# Patient Record
Sex: Male | Born: 1935
Health system: Southern US, Community
[De-identification: ages and names within clinical notes are randomized; demographics above are authoritative.]

## PROBLEM LIST (undated history)

## (undated) DIAGNOSIS — M25551 Pain in right hip: Secondary | ICD-10-CM

## (undated) DIAGNOSIS — R55 Syncope and collapse: Secondary | ICD-10-CM

## (undated) DIAGNOSIS — I10 Essential (primary) hypertension: Secondary | ICD-10-CM

## (undated) DIAGNOSIS — Z953 Presence of xenogenic heart valve: Secondary | ICD-10-CM

## (undated) DIAGNOSIS — Z789 Other specified health status: Secondary | ICD-10-CM

## (undated) DIAGNOSIS — E118 Type 2 diabetes mellitus with unspecified complications: Secondary | ICD-10-CM

## (undated) DIAGNOSIS — I639 Cerebral infarction, unspecified: Secondary | ICD-10-CM

## (undated) DIAGNOSIS — I6509 Occlusion and stenosis of unspecified vertebral artery: Secondary | ICD-10-CM

## (undated) DIAGNOSIS — I7121 Aneurysm of the ascending aorta, without rupture: Secondary | ICD-10-CM

## (undated) DIAGNOSIS — Z5189 Encounter for other specified aftercare: Secondary | ICD-10-CM

## (undated) DIAGNOSIS — E785 Hyperlipidemia, unspecified: Secondary | ICD-10-CM

## (undated) DIAGNOSIS — I2699 Other pulmonary embolism without acute cor pulmonale: Secondary | ICD-10-CM

## (undated) DIAGNOSIS — I359 Nonrheumatic aortic valve disorder, unspecified: Secondary | ICD-10-CM

## (undated) DIAGNOSIS — M1990 Unspecified osteoarthritis, unspecified site: Secondary | ICD-10-CM

## (undated) DIAGNOSIS — I739 Peripheral vascular disease, unspecified: Secondary | ICD-10-CM

## (undated) DIAGNOSIS — K5730 Diverticulosis of large intestine without perforation or abscess without bleeding: Secondary | ICD-10-CM

## (undated) DIAGNOSIS — I712 Thoracic aortic aneurysm, without rupture: Secondary | ICD-10-CM

## (undated) DIAGNOSIS — J439 Emphysema, unspecified: Secondary | ICD-10-CM

## (undated) DIAGNOSIS — I2510 Atherosclerotic heart disease of native coronary artery without angina pectoris: Secondary | ICD-10-CM

## (undated) DIAGNOSIS — E079 Disorder of thyroid, unspecified: Secondary | ICD-10-CM

## (undated) DIAGNOSIS — I7123 Aneurysm of the descending thoracic aorta, without rupture: Secondary | ICD-10-CM

## (undated) DIAGNOSIS — Z933 Colostomy status: Secondary | ICD-10-CM

## (undated) DIAGNOSIS — I509 Heart failure, unspecified: Secondary | ICD-10-CM

## (undated) DIAGNOSIS — E119 Type 2 diabetes mellitus without complications: Secondary | ICD-10-CM

## (undated) DIAGNOSIS — H269 Unspecified cataract: Secondary | ICD-10-CM

## (undated) DIAGNOSIS — K219 Gastro-esophageal reflux disease without esophagitis: Secondary | ICD-10-CM

## (undated) HISTORY — DX: Atherosclerotic heart disease of native coronary artery without angina pectoris: I25.10

## (undated) HISTORY — DX: Unspecified cataract: H26.9

## (undated) HISTORY — DX: Other pulmonary embolism without acute cor pulmonale: I26.99

## (undated) HISTORY — DX: Nonrheumatic aortic valve disorder, unspecified: I35.9

## (undated) HISTORY — DX: Cerebral infarction, unspecified: I63.9

## (undated) HISTORY — PX: SMALL INTESTINE SURGERY: SHX150

## (undated) HISTORY — PX: AORTIC VALVE REPAIR: SHX6306

## (undated) HISTORY — DX: Gastro-esophageal reflux disease without esophagitis: K21.9

## (undated) HISTORY — DX: Emphysema, unspecified: J43.9

## (undated) HISTORY — DX: Occlusion and stenosis of unspecified vertebral artery: I65.09

## (undated) HISTORY — DX: Syncope and collapse: R55

## (undated) HISTORY — DX: Hyperlipidemia, unspecified: E78.5

## (undated) HISTORY — DX: Thoracic aortic aneurysm, without rupture: I71.2

## (undated) HISTORY — DX: Peripheral vascular disease, unspecified: I73.9

## (undated) HISTORY — DX: Disorder of thyroid, unspecified: E07.9

## (undated) HISTORY — PX: HERNIA REPAIR: SHX51

## (undated) HISTORY — DX: Unspecified osteoarthritis, unspecified site: M19.90

## (undated) HISTORY — DX: Colostomy status: Z93.3

## (undated) HISTORY — DX: Essential (primary) hypertension: I10

## (undated) HISTORY — DX: Type 2 diabetes mellitus with unspecified complications: E11.8

## (undated) HISTORY — DX: Presence of xenogenic heart valve: Z95.3

## (undated) HISTORY — PX: COLON SURGERY: SHX602

## (undated) HISTORY — DX: Other specified health status: Z78.9

## (undated) HISTORY — DX: Pain in right hip: M25.551

## (undated) HISTORY — DX: Encounter for other specified aftercare: Z51.89

---

## 2011-06-17 DIAGNOSIS — I359 Nonrheumatic aortic valve disorder, unspecified: Secondary | ICD-10-CM

## 2011-06-29 DIAGNOSIS — I359 Nonrheumatic aortic valve disorder, unspecified: Secondary | ICD-10-CM

## 2011-08-11 ENCOUNTER — Ambulatory Visit (INDEPENDENT_AMBULATORY_CARE_PROVIDER_SITE_OTHER): Payer: Self-pay | Admitting: Physician Assistant

## 2011-08-11 DIAGNOSIS — I359 Nonrheumatic aortic valve disorder, unspecified: Secondary | ICD-10-CM

## 2011-08-11 DIAGNOSIS — I350 Nonrheumatic aortic (valve) stenosis: Secondary | ICD-10-CM

## 2011-08-11 DIAGNOSIS — I2510 Atherosclerotic heart disease of native coronary artery without angina pectoris: Secondary | ICD-10-CM

## 2011-08-11 NOTE — Progress Notes (Signed)
Jonathan Ryan returned today for scheduled followup after aortic valve replacement on 06/29/2011 for severe aortic stenosis. He had an uneventful postoperative course. Since his discharge home he is continuing to make progress. He is having minimal shortness of breath and is noticed improved stamina with activity. He denies any chest pain. On exam his heart is in a regular rate and rhythm although has had a history of atrial fibrillation. His sternotomy incision is well healed. Sternum stable. Breath sounds are clear to auscultation. He has only trace peripheral edema. His medications are reviewed and haven't changed since his discharge except he is no longer taking metoprolol since he was apparently hypotensive. No further followup scheduled with our office. He has a is to continue following with his cardiologist.

## 2014-08-26 NOTE — Patient Outreach (Signed)
[place] [facility]) Care Management  08/26/2014  ABDULLOH ULLOM Feb 08, 1929 [id number]   Received referral from [facility] for SW, assigned [employer], [place].  Ronnell Freshwater. [place], [place] [facility] [place] Assistant Phone: [phone] Fax: [phone]

## 2014-09-04 ENCOUNTER — Encounter: Payer: Self-pay | Admitting: *Deleted

## 2014-09-04 ENCOUNTER — Other Ambulatory Visit: Payer: Self-pay | Admitting: *Deleted

## 2014-09-04 NOTE — Patient Outreach (Signed)
[place] [facility]) Care Management  [facility] Social Work  09/04/2014  Jonathan Ryan 1928/10/16 [id number]    Current Medications:  No current outpatient prescriptions on file.   No current facility-administered medications for this visit.    Functional Status:  In your present state of health, do you have any difficulty performing the following activities: 09/04/2014  Hearing? N  Vision? N  Difficulty concentrating or making decisions? N  Walking or climbing stairs? N  Dressing or bathing? N  Doing errands, shopping? N  Preparing Food and eating ? N  Using the Toilet? N  In the past six months, have you accidently leaked urine? N  Do you have problems with loss of bowel control? N  Managing your Medications? N  Managing your Finances? N  Housekeeping or managing your Housekeeping? N    Fall/Depression Screening:  PHQ 2/9 Scores 09/04/2014  PHQ - 2 Score 0  PHQ- 9 Score 1    Assessment:   CSW was able to make initial contact with patient today to perform phone assessment, as well as assess and assist with social work needs and services.  CSW introduced self, explained role and types of services provided through [facility] CM).  CSW went on to explain to patient that CSW received a referral from patient's [facility] Case Manager, [employer]  Jonathan Ryan requested that CSW contact patient to provide possible referrals to community agencies and resources, as well as offer counseling and supportive services to patient's terminal wife, Jonathan Ryan, recently referred to Hospice for end-of-life care. CSW was able to obtain two HIPAA compliant identifiers from patient, which included patient's name and date of birth.  CSW also received verbal consent from patient to converse with CSW, as well as make possible referrals to various community agencies and resources, if interested.  Patient admitted that he was aware that CSW would be  contacting him, but reported, "I've decided to go with [facility]".  CSW reminded patient that patient does not have to choose between [facility]) and [facility] CM, as [facility] is home care services and [facility] CM is community case management services.  Not to mention, CSW explained to patient that [facility] CM does not bill for services, as a benefit of having a [facility] provider. Patient voiced understanding, but reported that [facility] CM services would not be needed at this time.  Patient went on to say that Jonathan Ryan died early yesterday morning at [facility].  Patient admitted that he was surprised how quickly her disease progressed, as Jonathan Ryan was only recently enrolled in Hospice services.  Patient admits to being sad, but at peace knowing that Jonathan Ryan is no longer suffering.  Patient went on to explain that Jonathan Ryan "has been ready to meet her maker for several years".  Patient is able to find resolve in knowing that he and Jonathan Ryan will be together again in the afterlife. CSW inquired as to whether or not patient would be interested in receiving counseling and supportive services, either through a Grief and Loss Program, such as Hospice.  Patient denied, indicating that he has a good support system and the ability to adequately cope with his recent loss.  Patient was very appreciative of CSW's call, agreeing to take down CSW's contact information, in the event that social work needs are identified in the future.  CSW will perform a case closure on patient, as no social work  needs have been identified at this time.  Plan:   CSW will perform a case closure on patient, as no social work needs have been identified at present and patient admits that he is currently receiving home care services through [facility] CSW will contact patient's Primary Care Physician, Dr. Kennith Maes to ensure that Dr. Helene Kelp is aware of CSW's plans to close patient's case, per  patient's request. CSW will submit a case closure request to Jonathan Ryan, Care Management Assistant with [facility], in the form of an In Basket message.  Jonathan Ryan, BSW, MSW, [place] [facility] [place], [place], [place] [address] [email or web address] [phone]

## 2015-01-06 DIAGNOSIS — Z953 Presence of xenogenic heart valve: Secondary | ICD-10-CM

## 2015-01-06 DIAGNOSIS — I712 Thoracic aortic aneurysm, without rupture, unspecified: Secondary | ICD-10-CM

## 2015-01-06 DIAGNOSIS — I7123 Aneurysm of the descending thoracic aorta, without rupture: Secondary | ICD-10-CM | POA: Insufficient documentation

## 2015-01-06 DIAGNOSIS — I2510 Atherosclerotic heart disease of native coronary artery without angina pectoris: Secondary | ICD-10-CM | POA: Insufficient documentation

## 2015-01-06 DIAGNOSIS — E785 Hyperlipidemia, unspecified: Secondary | ICD-10-CM

## 2015-01-06 DIAGNOSIS — Z789 Other specified health status: Secondary | ICD-10-CM

## 2015-01-06 HISTORY — DX: Hyperlipidemia, unspecified: E78.5

## 2015-01-06 HISTORY — DX: Atherosclerotic heart disease of native coronary artery without angina pectoris: I25.10

## 2015-01-06 HISTORY — DX: Thoracic aortic aneurysm, without rupture, unspecified: I71.20

## 2015-01-06 HISTORY — DX: Thoracic aortic aneurysm, without rupture: I71.2

## 2015-01-06 HISTORY — DX: Other specified health status: Z78.9

## 2015-01-06 HISTORY — DX: Presence of xenogenic heart valve: Z95.3

## 2015-04-20 DIAGNOSIS — J111 Influenza due to unidentified influenza virus with other respiratory manifestations: Secondary | ICD-10-CM | POA: Diagnosis not present

## 2015-05-01 DIAGNOSIS — J208 Acute bronchitis due to other specified organisms: Secondary | ICD-10-CM | POA: Diagnosis not present

## 2015-05-01 DIAGNOSIS — Z6828 Body mass index (BMI) 28.0-28.9, adult: Secondary | ICD-10-CM | POA: Diagnosis not present

## 2015-05-26 DIAGNOSIS — E781 Pure hyperglyceridemia: Secondary | ICD-10-CM | POA: Diagnosis not present

## 2015-05-26 DIAGNOSIS — R600 Localized edema: Secondary | ICD-10-CM | POA: Diagnosis not present

## 2015-05-26 DIAGNOSIS — Z6828 Body mass index (BMI) 28.0-28.9, adult: Secondary | ICD-10-CM | POA: Diagnosis not present

## 2015-05-26 DIAGNOSIS — I10 Essential (primary) hypertension: Secondary | ICD-10-CM | POA: Diagnosis not present

## 2015-05-26 DIAGNOSIS — E1129 Type 2 diabetes mellitus with other diabetic kidney complication: Secondary | ICD-10-CM | POA: Diagnosis not present

## 2015-05-26 DIAGNOSIS — C61 Malignant neoplasm of prostate: Secondary | ICD-10-CM | POA: Diagnosis not present

## 2015-05-26 DIAGNOSIS — D638 Anemia in other chronic diseases classified elsewhere: Secondary | ICD-10-CM | POA: Diagnosis not present

## 2015-06-07 DIAGNOSIS — Z7982 Long term (current) use of aspirin: Secondary | ICD-10-CM | POA: Diagnosis not present

## 2015-06-07 DIAGNOSIS — Z953 Presence of xenogenic heart valve: Secondary | ICD-10-CM | POA: Diagnosis not present

## 2015-06-07 DIAGNOSIS — I509 Heart failure, unspecified: Secondary | ICD-10-CM | POA: Diagnosis not present

## 2015-06-07 DIAGNOSIS — Z952 Presence of prosthetic heart valve: Secondary | ICD-10-CM | POA: Diagnosis not present

## 2015-06-07 DIAGNOSIS — R0600 Dyspnea, unspecified: Secondary | ICD-10-CM | POA: Diagnosis not present

## 2015-06-07 DIAGNOSIS — Z7984 Long term (current) use of oral hypoglycemic drugs: Secondary | ICD-10-CM | POA: Diagnosis not present

## 2015-06-07 DIAGNOSIS — R5383 Other fatigue: Secondary | ICD-10-CM | POA: Diagnosis not present

## 2015-06-07 DIAGNOSIS — Z794 Long term (current) use of insulin: Secondary | ICD-10-CM | POA: Diagnosis not present

## 2015-06-07 DIAGNOSIS — Z8546 Personal history of malignant neoplasm of prostate: Secondary | ICD-10-CM | POA: Diagnosis not present

## 2015-06-07 DIAGNOSIS — Z933 Colostomy status: Secondary | ICD-10-CM | POA: Diagnosis not present

## 2015-06-07 DIAGNOSIS — J984 Other disorders of lung: Secondary | ICD-10-CM | POA: Diagnosis not present

## 2015-06-07 DIAGNOSIS — R55 Syncope and collapse: Secondary | ICD-10-CM | POA: Diagnosis not present

## 2015-06-07 DIAGNOSIS — Z880 Allergy status to penicillin: Secondary | ICD-10-CM | POA: Diagnosis not present

## 2015-06-07 DIAGNOSIS — I2510 Atherosclerotic heart disease of native coronary artery without angina pectoris: Secondary | ICD-10-CM | POA: Diagnosis not present

## 2015-06-07 DIAGNOSIS — Z79899 Other long term (current) drug therapy: Secondary | ICD-10-CM | POA: Diagnosis not present

## 2015-06-07 DIAGNOSIS — E119 Type 2 diabetes mellitus without complications: Secondary | ICD-10-CM | POA: Diagnosis not present

## 2015-06-07 DIAGNOSIS — Z888 Allergy status to other drugs, medicaments and biological substances status: Secondary | ICD-10-CM | POA: Diagnosis not present

## 2015-06-07 DIAGNOSIS — I712 Thoracic aortic aneurysm, without rupture: Secondary | ICD-10-CM | POA: Diagnosis not present

## 2015-06-07 DIAGNOSIS — I110 Hypertensive heart disease with heart failure: Secondary | ICD-10-CM | POA: Diagnosis not present

## 2015-06-07 DIAGNOSIS — R42 Dizziness and giddiness: Secondary | ICD-10-CM | POA: Diagnosis not present

## 2015-06-07 DIAGNOSIS — Z66 Do not resuscitate: Secondary | ICD-10-CM | POA: Diagnosis not present

## 2015-06-07 DIAGNOSIS — R0602 Shortness of breath: Secondary | ICD-10-CM | POA: Diagnosis not present

## 2015-06-07 DIAGNOSIS — E782 Mixed hyperlipidemia: Secondary | ICD-10-CM | POA: Diagnosis not present

## 2015-06-08 DIAGNOSIS — I058 Other rheumatic mitral valve diseases: Secondary | ICD-10-CM | POA: Diagnosis not present

## 2015-06-08 DIAGNOSIS — I361 Nonrheumatic tricuspid (valve) insufficiency: Secondary | ICD-10-CM | POA: Diagnosis not present

## 2015-06-08 DIAGNOSIS — I517 Cardiomegaly: Secondary | ICD-10-CM | POA: Diagnosis not present

## 2015-06-08 DIAGNOSIS — I712 Thoracic aortic aneurysm, without rupture: Secondary | ICD-10-CM | POA: Diagnosis not present

## 2015-06-08 DIAGNOSIS — R55 Syncope and collapse: Secondary | ICD-10-CM | POA: Diagnosis not present

## 2015-06-08 DIAGNOSIS — Z952 Presence of prosthetic heart valve: Secondary | ICD-10-CM | POA: Diagnosis not present

## 2015-06-10 ENCOUNTER — Emergency Department (HOSPITAL_COMMUNITY): Payer: PPO

## 2015-06-10 ENCOUNTER — Observation Stay (HOSPITAL_COMMUNITY)
Admission: EM | Admit: 2015-06-10 | Discharge: 2015-06-11 | Disposition: A | Discharge status: Home / Self Care | Payer: PPO | Attending: Internal Medicine | Admitting: Internal Medicine

## 2015-06-10 ENCOUNTER — Encounter (HOSPITAL_COMMUNITY): Payer: Self-pay | Admitting: Emergency Medicine

## 2015-06-10 DIAGNOSIS — R011 Cardiac murmur, unspecified: Secondary | ICD-10-CM | POA: Insufficient documentation

## 2015-06-10 DIAGNOSIS — I739 Peripheral vascular disease, unspecified: Secondary | ICD-10-CM

## 2015-06-10 DIAGNOSIS — I110 Hypertensive heart disease with heart failure: Secondary | ICD-10-CM | POA: Diagnosis present

## 2015-06-10 DIAGNOSIS — I2510 Atherosclerotic heart disease of native coronary artery without angina pectoris: Secondary | ICD-10-CM | POA: Diagnosis not present

## 2015-06-10 DIAGNOSIS — R0602 Shortness of breath: Secondary | ICD-10-CM | POA: Diagnosis not present

## 2015-06-10 DIAGNOSIS — Z7984 Long term (current) use of oral hypoglycemic drugs: Secondary | ICD-10-CM | POA: Diagnosis not present

## 2015-06-10 DIAGNOSIS — I509 Heart failure, unspecified: Secondary | ICD-10-CM | POA: Insufficient documentation

## 2015-06-10 DIAGNOSIS — Z0389 Encounter for observation for other suspected diseases and conditions ruled out: Secondary | ICD-10-CM | POA: Diagnosis not present

## 2015-06-10 DIAGNOSIS — Z79899 Other long term (current) drug therapy: Secondary | ICD-10-CM | POA: Diagnosis not present

## 2015-06-10 DIAGNOSIS — Z880 Allergy status to penicillin: Secondary | ICD-10-CM | POA: Diagnosis not present

## 2015-06-10 DIAGNOSIS — E118 Type 2 diabetes mellitus with unspecified complications: Secondary | ICD-10-CM

## 2015-06-10 DIAGNOSIS — R55 Syncope and collapse: Secondary | ICD-10-CM

## 2015-06-10 DIAGNOSIS — Z7982 Long term (current) use of aspirin: Secondary | ICD-10-CM | POA: Diagnosis not present

## 2015-06-10 DIAGNOSIS — H538 Other visual disturbances: Secondary | ICD-10-CM | POA: Diagnosis not present

## 2015-06-10 DIAGNOSIS — I359 Nonrheumatic aortic valve disorder, unspecified: Secondary | ICD-10-CM | POA: Diagnosis present

## 2015-06-10 DIAGNOSIS — R42 Dizziness and giddiness: Secondary | ICD-10-CM | POA: Diagnosis not present

## 2015-06-10 DIAGNOSIS — Z933 Colostomy status: Secondary | ICD-10-CM

## 2015-06-10 DIAGNOSIS — I10 Essential (primary) hypertension: Secondary | ICD-10-CM | POA: Diagnosis not present

## 2015-06-10 DIAGNOSIS — E119 Type 2 diabetes mellitus without complications: Secondary | ICD-10-CM | POA: Diagnosis not present

## 2015-06-10 DIAGNOSIS — Z794 Long term (current) use of insulin: Secondary | ICD-10-CM | POA: Diagnosis not present

## 2015-06-10 DIAGNOSIS — I7100 Dissection of unspecified site of aorta: Secondary | ICD-10-CM

## 2015-06-10 DIAGNOSIS — Z952 Presence of prosthetic heart valve: Secondary | ICD-10-CM

## 2015-06-10 DIAGNOSIS — I779 Disorder of arteries and arterioles, unspecified: Secondary | ICD-10-CM | POA: Diagnosis present

## 2015-06-10 HISTORY — DX: Aneurysm of the ascending aorta, without rupture: I71.21

## 2015-06-10 HISTORY — DX: Heart failure, unspecified: I50.9

## 2015-06-10 HISTORY — DX: Syncope and collapse: R55

## 2015-06-10 HISTORY — DX: Type 2 diabetes mellitus without complications: E11.9

## 2015-06-10 HISTORY — DX: Diverticulosis of large intestine without perforation or abscess without bleeding: K57.30

## 2015-06-10 HISTORY — DX: Essential (primary) hypertension: I10

## 2015-06-10 HISTORY — DX: Atherosclerotic heart disease of native coronary artery without angina pectoris: I25.10

## 2015-06-10 HISTORY — DX: Thoracic aortic aneurysm, without rupture: I71.2

## 2015-06-10 HISTORY — DX: Aneurysm of the descending thoracic aorta, without rupture: I71.23

## 2015-06-10 LAB — CBC
HCT: 43.4 % (ref 39.0–52.0)
Hemoglobin: 14.5 g/dL (ref 13.0–17.0)
MCH: 31.2 pg (ref 26.0–34.0)
MCHC: 33.4 g/dL (ref 30.0–36.0)
MCV: 93.3 fL (ref 78.0–100.0)
Platelets: 161 10*3/uL (ref 150–400)
RBC: 4.65 MIL/uL (ref 4.22–5.81)
RDW: 13.2 % (ref 11.5–15.5)
WBC: 7 10*3/uL (ref 4.0–10.5)

## 2015-06-10 LAB — BASIC METABOLIC PANEL
Anion gap: 9 (ref 5–15)
BUN: 12 mg/dL (ref 6–20)
CHLORIDE: 105 mmol/L (ref 101–111)
CO2: 27 mmol/L (ref 22–32)
CREATININE: 0.91 mg/dL (ref 0.61–1.24)
Calcium: 9.7 mg/dL (ref 8.9–10.3)
Glucose, Bld: 249 mg/dL — ABNORMAL HIGH (ref 65–99)
POTASSIUM: 4.2 mmol/L (ref 3.5–5.1)
SODIUM: 141 mmol/L (ref 135–145)

## 2015-06-10 LAB — I-STAT TROPONIN, ED: Troponin i, poc: 0 ng/mL (ref 0.00–0.08)

## 2015-06-10 MED ORDER — ENOXAPARIN SODIUM 40 MG/0.4ML ~~LOC~~ SOLN: 40.0000 mg | SUBCUTANEOUS | Status: DC

## 2015-06-10 MED ORDER — PROMETHAZINE-DM 6.25-15 MG/5ML PO SYRP: 5.0000 mL | ORAL_SOLUTION | Freq: Four times a day (QID) | ORAL | Status: DC | PRN

## 2015-06-10 MED ORDER — INSULIN ASPART 100 UNIT/ML ~~LOC~~ SOLN: 0.0000 [IU] | Freq: Three times a day (TID) | SUBCUTANEOUS | Status: DC

## 2015-06-10 MED ORDER — ASPIRIN EC 81 MG PO TBEC
81.0000 mg | DELAYED_RELEASE_TABLET | Freq: Every day | ORAL | Status: DC
  Administered 2015-06-11: 81 mg via ORAL
  Filled 2015-06-10: qty 1

## 2015-06-10 MED ORDER — INSULIN DETEMIR 100 UNIT/ML ~~LOC~~ SOLN
20.0000 [IU] | Freq: Every day | SUBCUTANEOUS | Status: DC
  Administered 2015-06-10: 20 [IU] via SUBCUTANEOUS
  Filled 2015-06-10 (×2): qty 0.2

## 2015-06-10 MED ORDER — METOPROLOL TARTRATE 12.5 MG HALF TABLET
12.5000 mg | ORAL_TABLET | Freq: Two times a day (BID) | ORAL | Status: DC
  Administered 2015-06-10 – 2015-06-11 (×2): 12.5 mg via ORAL
  Filled 2015-06-10 (×2): qty 1

## 2015-06-10 MED ORDER — IOHEXOL 350 MG/ML SOLN
100.0000 mL | Freq: Once | INTRAVENOUS | Status: AC | PRN
  Administered 2015-06-10: 100 mL via INTRAVENOUS

## 2015-06-10 MED ORDER — ONDANSETRON HCL 4 MG/2ML IJ SOLN: 4.0000 mg | Freq: Three times a day (TID) | INTRAMUSCULAR | Status: DC | PRN

## 2015-06-10 NOTE — H&P (Signed)
Date: 06/11/2015               Patient Name:  Jonathan Ryan MRN: [id number]  DOB: Nov 21, 1928 Age / Sex: 82 y.o., male   PCP: Ronita Hipps, MD         Medical Service: Internal Medicine Teaching Service         Attending Physician: Dr. Oval Linsey, MD    First Contact: Dr. Lovena Le Pager: [phone]  Second Contact: Dr. Randell Patient  Pager: [phone]       After Hours (After 5p/  First Contact Pager: [phone]  weekends / holidays): Second Contact Pager: [phone]   Chief Complaint:    History of Present Illness: Patient is a 82 yo male with a past medical history of CAD, CHF,  aortic aneurysm, aortic valve replacement, hypertension, peripheral vascular disease, DM 2, presenting to the hospital complaining of a pre-syncopal episode. Patient states he was driving his car 4 days ago when all of a sudden it felt like his "heart stopped" and he was going to "pass out." States he did not lose consciousness and continued to drive. He believes the episode resolved within a few seconds to a minute. Denies having any blackout, blurry vision, or double vision. States his vision was different and it felt as if "things were out of place." Reports feeling weak after this happened. Also reports having a similar episode in the past (also while driving) which was a few weeks ago. Patient did not seek any medical attention at that time. Denies having any headaches, history of seizures, or focal neurological symptoms. Denies having seizure-like symptoms during this episode. Denies having any chest pain, SOB, cough, nausea, or vomiting. Does report having heart palpitations which he believes are becoming more frequent over time. He is not sure if palpitations occur at rest or with activity. Reports having a history of Afib but is not on any chronic anticoagulation. Patient does report not eating the morning this happened but does not think his symptoms are related to hypoglycemia because when he had a similar episode a few  weeks ago, it was after lunchtime. Denies having any viral URI symptoms, no recent sick contacts. No syncopal episodes in the past. He is not sure of any family history of sudden cardiac death.   Meds: Current Facility-Administered Medications  Medication Dose Route Frequency Provider Last Rate Last Dose  . aspirin EC tablet 81 mg  81 mg Oral Daily Ejiroghene E Emokpae, MD      . enoxaparin (LOVENOX) injection 40 mg  40 mg Subcutaneous Q24H Ejiroghene E Emokpae, MD      . insulin aspart (novoLOG) injection 0-9 Units  0-9 Units Subcutaneous TID WC Ejiroghene E Emokpae, MD      . insulin detemir (LEVEMIR) injection 20 Units  20 Units Subcutaneous QHS Bethena Roys, MD   20 Units at 06/10/15 2330  . metoprolol tartrate (LOPRESSOR) tablet 12.5 mg  12.5 mg Oral BID Ejiroghene Arlyce Dice, MD   12.5 mg at 06/10/15 2330    Allergies: Allergies as of 06/10/2015 - Review Complete 06/10/2015  Allergen Reaction Noted  . Penicillins  06/10/2015  . Vancomycin  06/10/2015  . Ramipril Rash 06/10/2015   Past Medical History  Diagnosis Date  . Coronary artery disease   . Hypertension   . CHF (congestive heart failure) ([place])   . Diabetes mellitus without complication ([place])   . Diverticular disease of left colon   . Thoracic ascending aortic aneurysm ([place])     /  notes 06/10/2015  . Descending thoracic aortic aneurysm ([place])     Archie Endo 06/10/2015   Past Surgical History  Procedure Laterality Date  . Aortic valve repair      Archie Endo 06/10/2015  . Colon surgery     Family History  Problem Relation Age of Onset  . Heart attack Father    Social History   Social History  . Marital Status: Single    Spouse Name: N/A  . Number of Children: N/A  . Years of Education: N/A   Occupational History  . Not on file.   Social History Main Topics  . Smoking status: Former Smoker    Types: Cigarettes  . Smokeless tobacco: Not on file     Comment: Smoked 2 packs/ week as a teenager   . Alcohol Use:  No  . Drug Use: No  . Sexual Activity: Not on file   Other Topics Concern  . Not on file   Social History Narrative    Review of Systems: Review of Systems  Constitutional: Positive for malaise/fatigue. Negative for fever and chills.  HENT: Negative for congestion and sore throat.   Eyes: Negative for blurred vision, double vision and pain.  Respiratory: Negative for cough, sputum production, shortness of breath and wheezing.   Cardiovascular: Positive for palpitations. Negative for chest pain, orthopnea and leg swelling.  Gastrointestinal: Negative for nausea, vomiting and abdominal pain.  Genitourinary: Negative for dysuria, urgency and frequency.  Musculoskeletal: Negative for myalgias and joint pain.  Skin: Negative for itching and rash.  Neurological: Negative for dizziness, sensory change, focal weakness, loss of consciousness and headaches.    Physical Exam: Blood pressure 142/81, pulse 76, temperature 98.1 F (36.7 C), temperature source Oral, resp. rate 17, height 5\' 9"  (1.753 m), weight 83.371 kg (183 lb 12.8 oz), SpO2 99 %. Physical Exam  Constitutional: He is oriented to person, place, and time. He appears well-developed and well-nourished. No distress.  HENT:  Head: Normocephalic and atraumatic.  Mouth/Throat: Oropharynx is clear and moist.  Eyes: EOM are normal. Pupils are equal, round, and reactive to light. No scleral icterus.  Neck: Neck supple. No JVD present. No tracheal deviation present.  Cardiovascular: Normal rate, regular rhythm and intact distal pulses.  Exam reveals no gallop and no friction rub.   Murmur heard. Grade 3/6 systolic murmur best appreciated at the right upper sternal border.   Pulmonary/Chest: Effort normal and breath sounds normal. No respiratory distress. He has no wheezes. He has no rales.  Abdominal: Soft. Bowel sounds are normal. He exhibits no distension. There is no tenderness.  LLQ colostomy bag - stoma does not appear infected     Musculoskeletal: Normal range of motion. He exhibits no edema.  Neurological: He is alert and oriented to person, place, and time. No cranial nerve deficit.  Strength and sensation grossly intact in b/l upper and lower extremities.   Skin: Skin is warm and dry. No rash noted. He is not diaphoretic. No erythema.  Psychiatric: He has a normal mood and affect.    Lab results: Basic Metabolic Panel:  Recent Labs  06/10/15 1304  NA 141  K 4.2  CL 105  CO2 27  GLUCOSE 249*  BUN 12  CREATININE 0.91  CALCIUM 9.7   CBC:  Recent Labs  06/10/15 1304  WBC 7.0  HGB 14.5  HCT 43.4  MCV 93.3  PLT 161   Imaging results:  Dg Chest 2 View  06/10/2015  CLINICAL DATA:  Dizziness, blurred  vision, near syncope 1 week ago, chest heaviness, chest pain EXAM: CHEST  2 VIEW COMPARISON:  02/03/2015 FINDINGS: Cardiomediastinal silhouette is stable. Status post median sternotomy. Again noted aneurysmal dilatation of ascending aorta and aortic arch. Atherosclerotic calcifications of thoracic aorta again noted. No infiltrate or pulmonary edema. IMPRESSION: No infiltrate or pulmonary edema. Again noted aneurysmal dilatation of ascending aorta and aortic arch. Status post median sternotomy. Electronically Signed   By: Lahoma Crocker M.D.   On: 06/10/2015 13:48   Ct Angio Chest Aorta W/cm &/or Wo/cm  06/10/2015  CLINICAL DATA:  Rule out aortic dissection EXAM: CT ANGIOGRAPHY CHEST, ABDOMEN AND PELVIS TECHNIQUE: Multidetector CT imaging through the chest, abdomen and pelvis was performed using the standard protocol during bolus administration of intravenous contrast. Multiplanar reconstructed images and MIPs were obtained and reviewed to evaluate the vascular anatomy. CONTRAST:  183mL OMNIPAQUE IOHEXOL 350 MG/ML SOLN COMPARISON:  11/06/2014 FINDINGS: CTA CHEST FINDINGS Mediastinum: Normal heart size. Previous median sternotomy and aortic valve repair. Aortic atherosclerosis identified calcification involving the LAD  and left circumflex coronary artery identified. The ascending thoracic aorta measures 4.7 cm in diameter. The transverse aortic arch measures 4.2 cm. The descending thoracic aorta measures 5 cm and maximum dimension. There is no aortic dissection. The trachea is patent and is midline. Normal appearance of the esophagus. There is no mediastinal or hilar adenopathy. Lungs/Pleura: No pleural effusion identified. No suspicious nodule or identified. No airspace consolidation or pneumothorax. Musculoskeletal: No acute bone abnormality identified. Review of the MIP images confirms the above findings. CTA ABDOMEN AND PELVIS FINDINGS Hepatobiliary: The adrenal glands appear within normal limits. No suspicious liver abnormality identified. Stone is noted within the gallbladder measuring 1 cm. Pancreas: Negative Spleen: The spleen appears normal. Adrenals/Urinary Tract: The adrenal glands are normal. Bilateral renal cysts are noted. No obstructive uropathy. The urinary bladder appears normal. Stomach/Bowel: The stomach is normal. The small bowel loops have a normal caliber. Postoperative changes from a left lower quadrant colostomy identified. Numerous colonic diverticula noted. Vascular/Lymphatic: Aortic atherosclerosis noted. The suprarenal abdominal aorta measures up to 3 cm. The infrarenal abdominal aorta has a maximum AP dimension of 2.6 cm. No upper abdominal or pelvic adenopathy. Reproductive: Mild prostate gland enlargement. Symmetric appearance of the seminal vesicles. Other: No free fluid or fluid collections within the abdomen or pelvis. Musculoskeletal: The bones are osteopenic. There is degenerative disc disease noted within the lumbar spine. Review of the MIP images confirms the above findings. IMPRESSION: 1. No evidence for aortic dissection. 2. Aortic atherosclerosis and multi vessel coronary artery disease. 3. Ascending thoracic aortic aneurysm. Recommend semi-annual imaging followup by CTA or MRA and referral  to cardiothoracic surgery if not already obtained. This recommendation follows 2010 ACCF/AHA/AATS/ACR/ASA/SCA/SCAI/SIR/STS/SVM Guidelines for the Diagnosis and Management of Patients With Thoracic Aortic Disease. Circulation. 2010; 121: [id number] 4. Ectatic abdominal aorta at risk for aneurysm development. Recommend follow up by [place] in 5 years. This recommendation follows ACR consensus guidelines: White Paper of the ACR Incidental Findings Committee II on Vascular Findings. J Am Coll Radiol 2013; 10:789-794. 5. Gallstones Electronically Signed   By: Kerby Moors M.D.   On: 06/10/2015 17:24   Ct Cta Abd/pel W/cm &/or W/o Cm  06/10/2015  CLINICAL DATA:  Rule out aortic dissection EXAM: CT ANGIOGRAPHY CHEST, ABDOMEN AND PELVIS TECHNIQUE: Multidetector CT imaging through the chest, abdomen and pelvis was performed using the standard protocol during bolus administration of intravenous contrast. Multiplanar reconstructed images and MIPs were obtained and reviewed to evaluate the  vascular anatomy. CONTRAST:  160mL OMNIPAQUE IOHEXOL 350 MG/ML SOLN COMPARISON:  11/06/2014 FINDINGS: CTA CHEST FINDINGS Mediastinum: Normal heart size. Previous median sternotomy and aortic valve repair. Aortic atherosclerosis identified calcification involving the LAD and left circumflex coronary artery identified. The ascending thoracic aorta measures 4.7 cm in diameter. The transverse aortic arch measures 4.2 cm. The descending thoracic aorta measures 5 cm and maximum dimension. There is no aortic dissection. The trachea is patent and is midline. Normal appearance of the esophagus. There is no mediastinal or hilar adenopathy. Lungs/Pleura: No pleural effusion identified. No suspicious nodule or identified. No airspace consolidation or pneumothorax. Musculoskeletal: No acute bone abnormality identified. Review of the MIP images confirms the above findings. CTA ABDOMEN AND PELVIS FINDINGS Hepatobiliary: The adrenal glands appear within  normal limits. No suspicious liver abnormality identified. Stone is noted within the gallbladder measuring 1 cm. Pancreas: Negative Spleen: The spleen appears normal. Adrenals/Urinary Tract: The adrenal glands are normal. Bilateral renal cysts are noted. No obstructive uropathy. The urinary bladder appears normal. Stomach/Bowel: The stomach is normal. The small bowel loops have a normal caliber. Postoperative changes from a left lower quadrant colostomy identified. Numerous colonic diverticula noted. Vascular/Lymphatic: Aortic atherosclerosis noted. The suprarenal abdominal aorta measures up to 3 cm. The infrarenal abdominal aorta has a maximum AP dimension of 2.6 cm. No upper abdominal or pelvic adenopathy. Reproductive: Mild prostate gland enlargement. Symmetric appearance of the seminal vesicles. Other: No free fluid or fluid collections within the abdomen or pelvis. Musculoskeletal: The bones are osteopenic. There is degenerative disc disease noted within the lumbar spine. Review of the MIP images confirms the above findings. IMPRESSION: 1. No evidence for aortic dissection. 2. Aortic atherosclerosis and multi vessel coronary artery disease. 3. Ascending thoracic aortic aneurysm. Recommend semi-annual imaging followup by CTA or MRA and referral to cardiothoracic surgery if not already obtained. This recommendation follows 2010 ACCF/AHA/AATS/ACR/ASA/SCA/SCAI/SIR/STS/SVM Guidelines for the Diagnosis and Management of Patients With Thoracic Aortic Disease. Circulation. 2010; 121: [id number] 4. Ectatic abdominal aorta at risk for aneurysm development. Recommend follow up by [place] in 5 years. This recommendation follows ACR consensus guidelines: White Paper of the ACR Incidental Findings Committee II on Vascular Findings. J Am Coll Radiol 2013; 10:789-794. 5. Gallstones Electronically Signed   By: Kerby Moors M.D.   On: 06/10/2015 17:24    Other results: EKG: Normal rate (99 bpm). Normal sinus rhythm. No acute ST/  T wave changes noted. No prior EKG to compare.    Assessment & Plan by Problem: Active Problems:   Near syncope   Pre-syncope  Pre-syncope Patient reports having 2 episodes in the past few weeks. No prodomal symptoms. No loss of consciousness. Does report having palpitations which are concerning for arrythmia. Sick sinus syndrome is also on the differential. Labs including CBC and BMP are unremarkable. Vital signs are normal. Troponin negative. EKG showing normal sinus rhythm. No signs of ischemia or arrhythmia on the EKG. His symptoms are less likely to be neurological as he has no history of seizures and focal neurological deficits on exam.  -Admit to telemetry -Consult cardiology in the morning as patient might need a loop recorder -F/u am EKG -Trend troponin q 6 hrs -check orthostatic vitals   CAD Patient is not sure if he ever had cardiac cath done. No records in epic.  -Aspirin 81 mg daily -Metoprolol 12.5 mg BID  HTN -Metoprolol 12.5 mg BID   DM2 No A1c value in Epic.  -SSI-sensitive -Levemir 20 u QHS  DVT/ PE  ppx: SCDs  Diet: HH  Code: FULL (confirmed with patient)   Dispo: Disposition is deferred at this time, awaiting improvement of current medical problems. Anticipated discharge in approximately 2-3 day(s).   The patient does have a current PCP Ronita Hipps, MD) and does need an [facility] hospital follow-up appointment after discharge.  The patient does not have transportation limitations that hinder transportation to clinic appointments.  Signed: Shela Leff, MD 06/11/2015, 12:27 AM

## 2015-06-10 NOTE — ED Provider Notes (Signed)
CSN: [id number]     Arrival date & time 06/10/15  1250 History   First MD Initiated Contact with Patient 06/10/15 1457     Chief Complaint  Patient presents with  . Shortness of Breath     (Consider location/radiation/quality/duration/timing/severity/associated sxs/prior Treatment) HPI Comments: The patient is an 82 year old male, he has a known history of hypertension, congestive heart failure, diabetes and known thoracic aortic aneurysms both ascending and descending. He has had a repair of his aortic valve in the past.  He reports that on Friday and again on Sunday he had an episode lasting approximately 1 minute, both times while driving his car where he felt close to passing out. He states that he felt as though his heart slowed down or stopped, this lasted a significant amount of time causing him to feel severe weakness, some changes in his vision followed by return of his heart beat and his symptoms are going away very quickly. He does not have these symptoms at this time however his family doctor told him to come here for evaluation when he called them to tell him about his symptoms today. The patient denies any other symptoms including coughing, fever, diarrhea, rectal bleeding or any other complaints. He has had a good appetite though overall the patient states that he feels generally fatigued more of the time as he gets older.  Patient is a 82 y.o. male presenting with shortness of breath. The history is provided by the patient.  Shortness of Breath   Past Medical History  Diagnosis Date  . Coronary artery disease   . Hypertension   . CHF (congestive heart failure) ([place])   . Diabetes mellitus without complication ([place])   . Diverticular disease of left colon    Past Surgical History  Procedure Laterality Date  . Cardiac surgery    . Colon surgery     No family history on file. Social History  Substance Use Topics  . Smoking status: Never Smoker   . Smokeless tobacco: None   . Alcohol Use: No    Review of Systems  Respiratory: Positive for shortness of breath.   All other systems reviewed and are negative.     Allergies  Penicillins; Vancomycin; and Ramipril  Home Medications   Prior to Admission medications   Medication Sig Start Date End Date Taking? Authorizing Provider  Ascorbic Acid (VITAMIN C) 1000 MG tablet Take 1,000 mg by mouth daily.    Historical Provider, MD  aspirin EC 81 MG tablet Take 81 mg by mouth daily.    Historical Provider, MD  finasteride (PROPECIA) 1 MG tablet Take 5 mg by mouth daily.    Historical Provider, MD  glimepiride (AMARYL) 1 MG tablet Take 0.5 mg by mouth every evening.    Historical Provider, MD  insulin detemir (LEVEMIR) 100 UNIT/ML injection Inject 25 Units into the skin at bedtime.    Historical Provider, MD  metFORMIN (GLUCOPHAGE) 500 MG tablet Take 1,000 mg by mouth 2 (two) times daily.    Historical Provider, MD  metoprolol tartrate (LOPRESSOR) 25 MG tablet Take 12.5 mg by mouth 2 (two) times daily.    Historical Provider, MD  Multiple Vitamin (MULTI-VITAMINS) TABS Take 2 tablets by mouth daily.    Historical Provider, MD  promethazine-dextromethorphan (PROMETHAZINE-DM) 6.25-15 MG/5ML syrup Take 5 mLs by mouth 4 (four) times daily as needed for cough. 06/10/15   Noemi Chapel, MD  zinc gluconate 50 MG tablet Take 50 mg by mouth daily.  Historical Provider, MD   BP 137/88 mmHg  Pulse 74  Temp(Src) 97.9 F (36.6 C) (Oral)  Resp 12  Ht 5\' 9"  (1.753 m)  Wt 190 lb (86.183 kg)  BMI 28.05 kg/m2  SpO2 94% Physical Exam  Constitutional: He appears well-developed and well-nourished. No distress.  HENT:  Head: Normocephalic and atraumatic.  Mouth/Throat: Oropharynx is clear and moist. No oropharyngeal exudate.  Eyes: Conjunctivae and EOM are normal. Pupils are equal, round, and reactive to light. Right eye exhibits no discharge. Left eye exhibits no discharge. No scleral icterus.  Neck: Normal range of motion.  Neck supple. No JVD present. No thyromegaly present.  Cardiovascular: Normal rate, regular rhythm and intact distal pulses.  Exam reveals no gallop and no friction rub.   Murmur ( Mild systolic) heard. Pulmonary/Chest: Effort normal and breath sounds normal. No respiratory distress. He has no wheezes. He has no rales.  Abdominal: Soft. Bowel sounds are normal. He exhibits no distension and no mass. There is no tenderness.  Musculoskeletal: Normal range of motion. He exhibits no edema or tenderness.  Lymphadenopathy:    He has no cervical adenopathy.  Neurological: He is alert. Coordination normal.  Skin: Skin is warm and dry. No rash noted. No erythema.  Psychiatric: He has a normal mood and affect. His behavior is normal.  Nursing note and vitals reviewed.   ED Course  Procedures (including critical care time) Labs Review Labs Reviewed  BASIC METABOLIC PANEL - Abnormal; Notable for the following:    Glucose, Bld 249 (*)    All other components within normal limits  CBC  I-STAT TROPOININ, ED    Imaging Review Dg Chest 2 View  06/10/2015  CLINICAL DATA:  Dizziness, blurred vision, near syncope 1 week ago, chest heaviness, chest pain EXAM: CHEST  2 VIEW COMPARISON:  02/03/2015 FINDINGS: Cardiomediastinal silhouette is stable. Status post median sternotomy. Again noted aneurysmal dilatation of ascending aorta and aortic arch. Atherosclerotic calcifications of thoracic aorta again noted. No infiltrate or pulmonary edema. IMPRESSION: No infiltrate or pulmonary edema. Again noted aneurysmal dilatation of ascending aorta and aortic arch. Status post median sternotomy. Electronically Signed   By: Lahoma Crocker M.D.   On: 06/10/2015 13:48   Ct Angio Chest Aorta W/cm &/or Wo/cm  06/10/2015  CLINICAL DATA:  Rule out aortic dissection EXAM: CT ANGIOGRAPHY CHEST, ABDOMEN AND PELVIS TECHNIQUE: Multidetector CT imaging through the chest, abdomen and pelvis was performed using the standard protocol during  bolus administration of intravenous contrast. Multiplanar reconstructed images and MIPs were obtained and reviewed to evaluate the vascular anatomy. CONTRAST:  176mL OMNIPAQUE IOHEXOL 350 MG/ML SOLN COMPARISON:  11/06/2014 FINDINGS: CTA CHEST FINDINGS Mediastinum: Normal heart size. Previous median sternotomy and aortic valve repair. Aortic atherosclerosis identified calcification involving the LAD and left circumflex coronary artery identified. The ascending thoracic aorta measures 4.7 cm in diameter. The transverse aortic arch measures 4.2 cm. The descending thoracic aorta measures 5 cm and maximum dimension. There is no aortic dissection. The trachea is patent and is midline. Normal appearance of the esophagus. There is no mediastinal or hilar adenopathy. Lungs/Pleura: No pleural effusion identified. No suspicious nodule or identified. No airspace consolidation or pneumothorax. Musculoskeletal: No acute bone abnormality identified. Review of the MIP images confirms the above findings. CTA ABDOMEN AND PELVIS FINDINGS Hepatobiliary: The adrenal glands appear within normal limits. No suspicious liver abnormality identified. Stone is noted within the gallbladder measuring 1 cm. Pancreas: Negative Spleen: The spleen appears normal. Adrenals/Urinary Tract: The adrenal glands  are normal. Bilateral renal cysts are noted. No obstructive uropathy. The urinary bladder appears normal. Stomach/Bowel: The stomach is normal. The small bowel loops have a normal caliber. Postoperative changes from a left lower quadrant colostomy identified. Numerous colonic diverticula noted. Vascular/Lymphatic: Aortic atherosclerosis noted. The suprarenal abdominal aorta measures up to 3 cm. The infrarenal abdominal aorta has a maximum AP dimension of 2.6 cm. No upper abdominal or pelvic adenopathy. Reproductive: Mild prostate gland enlargement. Symmetric appearance of the seminal vesicles. Other: No free fluid or fluid collections within the  abdomen or pelvis. Musculoskeletal: The bones are osteopenic. There is degenerative disc disease noted within the lumbar spine. Review of the MIP images confirms the above findings. IMPRESSION: 1. No evidence for aortic dissection. 2. Aortic atherosclerosis and multi vessel coronary artery disease. 3. Ascending thoracic aortic aneurysm. Recommend semi-annual imaging followup by CTA or MRA and referral to cardiothoracic surgery if not already obtained. This recommendation follows 2010 ACCF/AHA/AATS/ACR/ASA/SCA/SCAI/SIR/STS/SVM Guidelines for the Diagnosis and Management of Patients With Thoracic Aortic Disease. Circulation. 2010; 121: [id number] 4. Ectatic abdominal aorta at risk for aneurysm development. Recommend follow up by [place] in 5 years. This recommendation follows ACR consensus guidelines: White Paper of the ACR Incidental Findings Committee II on Vascular Findings. J Am Coll Radiol 2013; 10:789-794. 5. Gallstones Electronically Signed   By: Kerby Moors M.D.   On: 06/10/2015 17:24   Ct Cta Abd/pel W/cm &/or W/o Cm  06/10/2015  CLINICAL DATA:  Rule out aortic dissection EXAM: CT ANGIOGRAPHY CHEST, ABDOMEN AND PELVIS TECHNIQUE: Multidetector CT imaging through the chest, abdomen and pelvis was performed using the standard protocol during bolus administration of intravenous contrast. Multiplanar reconstructed images and MIPs were obtained and reviewed to evaluate the vascular anatomy. CONTRAST:  132mL OMNIPAQUE IOHEXOL 350 MG/ML SOLN COMPARISON:  11/06/2014 FINDINGS: CTA CHEST FINDINGS Mediastinum: Normal heart size. Previous median sternotomy and aortic valve repair. Aortic atherosclerosis identified calcification involving the LAD and left circumflex coronary artery identified. The ascending thoracic aorta measures 4.7 cm in diameter. The transverse aortic arch measures 4.2 cm. The descending thoracic aorta measures 5 cm and maximum dimension. There is no aortic dissection. The trachea is patent and is  midline. Normal appearance of the esophagus. There is no mediastinal or hilar adenopathy. Lungs/Pleura: No pleural effusion identified. No suspicious nodule or identified. No airspace consolidation or pneumothorax. Musculoskeletal: No acute bone abnormality identified. Review of the MIP images confirms the above findings. CTA ABDOMEN AND PELVIS FINDINGS Hepatobiliary: The adrenal glands appear within normal limits. No suspicious liver abnormality identified. Stone is noted within the gallbladder measuring 1 cm. Pancreas: Negative Spleen: The spleen appears normal. Adrenals/Urinary Tract: The adrenal glands are normal. Bilateral renal cysts are noted. No obstructive uropathy. The urinary bladder appears normal. Stomach/Bowel: The stomach is normal. The small bowel loops have a normal caliber. Postoperative changes from a left lower quadrant colostomy identified. Numerous colonic diverticula noted. Vascular/Lymphatic: Aortic atherosclerosis noted. The suprarenal abdominal aorta measures up to 3 cm. The infrarenal abdominal aorta has a maximum AP dimension of 2.6 cm. No upper abdominal or pelvic adenopathy. Reproductive: Mild prostate gland enlargement. Symmetric appearance of the seminal vesicles. Other: No free fluid or fluid collections within the abdomen or pelvis. Musculoskeletal: The bones are osteopenic. There is degenerative disc disease noted within the lumbar spine. Review of the MIP images confirms the above findings. IMPRESSION: 1. No evidence for aortic dissection. 2. Aortic atherosclerosis and multi vessel coronary artery disease. 3. Ascending thoracic aortic aneurysm. Recommend semi-annual imaging  followup by CTA or MRA and referral to cardiothoracic surgery if not already obtained. This recommendation follows 2010 ACCF/AHA/AATS/ACR/ASA/SCA/SCAI/SIR/STS/SVM Guidelines for the Diagnosis and Management of Patients With Thoracic Aortic Disease. Circulation. 2010; 121: [id number] 4. Ectatic abdominal aorta at  risk for aneurysm development. Recommend follow up by [place] in 5 years. This recommendation follows ACR consensus guidelines: White Paper of the ACR Incidental Findings Committee II on Vascular Findings. J Am Coll Radiol 2013; 10:789-794. 5. Gallstones Electronically Signed   By: Kerby Moors M.D.   On: 06/10/2015 17:24   I have personally reviewed and evaluated these images and lab results as part of my medical decision-making.   EKG Interpretation   Date/Time:  Tuesday June 10 2015 12:58:50 EST Ventricular Rate:  99 PR Interval:  208 QRS Duration: 88 QT Interval:  370 QTC Calculation: 474 R Axis:   -64 Text Interpretation:  Normal sinus rhythm Left anterior fascicular block  Septal infarct , age undetermined Abnormal ECG No old tracing to compare  Confirmed by Terril Amaro  MD, [place] ([id number]) on 06/10/2015 3:01:45 PM      MDM   Final diagnoses:  Near syncope    The patient is in a normal rhythm, he has an abnormal EKG with anterior fascicular block, no signs of ischemia, no signs of arrhythmia though his presentation does raise concern for some arrhythmia, his labs are totally unremarkable without any anemia, renal dysfunction or electrolyte disturbance. He does have slight hyperglycemia but is a known diabetic. His vital signs are also unremarkable, he is not tachycardic, hypotensive or hypoxic and has no fever. His chest x-ray shows no acute infiltrates or edema but does show his known thoracic aneurysms. We will need to further evaluate him with cardiac monitoring and CT scan imaging to evaluate aneurysms, I feel concerned that the patient may have arrhythmias that would need observation in the hospital on a cardiac monitor as he has had 2 of them over the last 5 days and has never had them in the past. Would consider sick sinus syndrome, tachycardia arrhythmias, bradycardia arrhythmias, ventricular arrhythmias, less likely to be pulmonary embolism  and less likely to be stroke given no  focal neuro deficits at the time or residual afterwards.  I have monitored the patient on a heart monitor since arrival, he does have frequent ectopy but is in a normal sinus rhythm. I am concerned that the events that he has had could represent prolonged arrhythmias or even bradycardia arrhythmias and thus I think it prudent to have the patient observed in the hospital on a cardiac monitor overnight. The patient is in agreement with the plan.  D/w.  IM residnet - agreeable to admission.  Holding orders requested  Labs and CT reviewed- no acute fidnings. Wife states that the aneurysm is same size as prior.  Noemi Chapel, MD 06/10/15 [phone]

## 2015-06-10 NOTE — ED Notes (Signed)
Pt states on Friday while driving he had an episode of feeling lightheaded and like he might passed out. Pt states he called his pcp today and told him about his episode and was told to come here. Pt denies any chest pain but states over the weekend he has felt week and becomes short of breath when he walks.

## 2015-06-11 ENCOUNTER — Other Ambulatory Visit: Payer: Self-pay | Admitting: Student

## 2015-06-11 ENCOUNTER — Encounter (HOSPITAL_COMMUNITY): Payer: Self-pay | Admitting: Internal Medicine

## 2015-06-11 DIAGNOSIS — E118 Type 2 diabetes mellitus with unspecified complications: Secondary | ICD-10-CM

## 2015-06-11 DIAGNOSIS — R55 Syncope and collapse: Secondary | ICD-10-CM

## 2015-06-11 DIAGNOSIS — Z933 Colostomy status: Secondary | ICD-10-CM

## 2015-06-11 DIAGNOSIS — I779 Disorder of arteries and arterioles, unspecified: Secondary | ICD-10-CM

## 2015-06-11 DIAGNOSIS — I110 Hypertensive heart disease with heart failure: Secondary | ICD-10-CM | POA: Diagnosis present

## 2015-06-11 DIAGNOSIS — I10 Essential (primary) hypertension: Secondary | ICD-10-CM

## 2015-06-11 DIAGNOSIS — R011 Cardiac murmur, unspecified: Secondary | ICD-10-CM | POA: Diagnosis not present

## 2015-06-11 DIAGNOSIS — I359 Nonrheumatic aortic valve disorder, unspecified: Secondary | ICD-10-CM | POA: Diagnosis not present

## 2015-06-11 DIAGNOSIS — I2510 Atherosclerotic heart disease of native coronary artery without angina pectoris: Secondary | ICD-10-CM | POA: Diagnosis not present

## 2015-06-11 DIAGNOSIS — I739 Peripheral vascular disease, unspecified: Secondary | ICD-10-CM

## 2015-06-11 HISTORY — DX: Colostomy status: Z93.3

## 2015-06-11 HISTORY — DX: Essential (primary) hypertension: I10

## 2015-06-11 HISTORY — DX: Disorder of arteries and arterioles, unspecified: I77.9

## 2015-06-11 HISTORY — DX: Type 2 diabetes mellitus with unspecified complications: E11.8

## 2015-06-11 HISTORY — DX: Nonrheumatic aortic valve disorder, unspecified: I35.9

## 2015-06-11 LAB — TROPONIN I: Troponin I: <0.03 ng/mL (ref <0.031)

## 2015-06-11 LAB — GLUCOSE, CAPILLARY
GLUCOSE-CAPILLARY: 121 mg/dL — AB (ref 65–99)
Glucose-Capillary: 120 mg/dL — ABNORMAL HIGH (ref 65–99)

## 2015-06-11 LAB — TSH: TSH: 2.996 u[IU]/mL (ref 0.350–4.500)

## 2015-06-11 NOTE — Progress Notes (Signed)
Internal Medicine Attending  Date: 06/11/2015  Patient name: Jonathan Ryan Medical record number: [id number] Date of birth: January 17, 1929 Age: 82 y.o. Gender: male  I saw and evaluated the patient. I reviewed the resident's note by Dr. Lovena Ryan and I agree with the resident's findings and plans as documented in his progress note.  Please see my H&P dated 06/11/2015 and attached to Dr. Elon Ryan H&P dated 06/10/2015 for the specifics of my evaluation, assessment, and plan from earlier today.

## 2015-06-11 NOTE — Care Management Obs Status (Signed)
[facility] NOTIFICATION   Patient Details  Name: Jonathan Ryan MRN: [id number] Date of Birth: 11-21-1928   Medicare Observation Status Notification Given:  Yes    Bethena Roys, RN 06/11/2015, 2:04 PM

## 2015-06-11 NOTE — Progress Notes (Signed)
Subjective: [name].  Patient reports no symptoms since admission.  He describes both the sensation of "fibrillation," consistent with palpitations and racing pulse, as well as feeling like his heart had stopped.   Objective: Vital signs in last 24 hours: Filed Vitals:   06/10/15 1915 06/10/15 2015 06/10/15 2100 06/11/15 0401  BP: 137/88 140/91 142/81 123/70  Pulse: 74 72 76 62  Temp:   98.1 F (36.7 C) 97.6 F (36.4 C)  TempSrc:   Oral Oral  Resp: 12 18 17 17   Height:   5\' 9"  (1.753 m)   Weight:   183 lb 12.8 oz (83.371 kg) 182 lb 11.2 oz (82.872 kg)  SpO2: 94% 96% 99% 96%   Weight change:   Intake/Output Summary (Last 24 hours) at 06/11/15 1246 Last data filed at 06/10/15 2300  Gross per 24 hour  Intake    240 ml  Output    550 ml  Net   -310 ml   Physical Exam  Constitutional: He is oriented to person, place, and time and well-developed, well-nourished, and in no distress.  Elderly, appears stated age.  HENT:  Head: Normocephalic and atraumatic.  Eyes: EOM are normal. No scleral icterus.  Neck: No JVD present. No tracheal deviation present.  Cardiovascular: Normal rate, regular rhythm and intact distal pulses.   Grade III/VI systolic crescendo-decrescendo murmur heard best at RUSB radiating to carotids.  Pulmonary/Chest: Effort normal and breath sounds normal. No stridor. No respiratory distress. He has no wheezes.  Abdominal: Soft. He exhibits no distension. There is no tenderness. There is no rebound and no guarding.  Musculoskeletal: He exhibits no edema.  Neurological: He is alert and oriented to person, place, and time.  Skin: Skin is warm and dry.    Lab Results: Basic Metabolic Panel:  Recent Labs Lab 06/10/15 1304  NA 141  K 4.2  CL 105  CO2 27  GLUCOSE 249*  BUN 12  CREATININE 0.91  CALCIUM 9.7   Liver Function Tests: No results for input(s): AST, ALT, ALKPHOS, BILITOT, PROT, ALBUMIN in the last 168 hours. No results for input(s): LIPASE,  AMYLASE in the last 168 hours. No results for input(s): AMMONIA in the last 168 hours. CBC:  Recent Labs Lab 06/10/15 1304  WBC 7.0  HGB 14.5  HCT 43.4  MCV 93.3  PLT 161   Cardiac Enzymes:  Recent Labs Lab 06/10/15 2340 06/11/15 0450 06/11/15 1010  TROPONINI <0.03 <0.03 <0.03   BNP: No results for input(s): PROBNP in the last 168 hours. D-Dimer: No results for input(s): DDIMER in the last 168 hours. CBG:  Recent Labs Lab 06/11/15 0723 06/11/15 1132  GLUCAP 120* 121*   Hemoglobin A1C: No results for input(s): HGBA1C in the last 168 hours. Fasting Lipid Panel: No results for input(s): CHOL, HDL, LDLCALC, TRIG, CHOLHDL, LDLDIRECT in the last 168 hours. Thyroid Function Tests:  Recent Labs Lab 06/11/15 1010  TSH 2.996   Coagulation: No results for input(s): LABPROT, INR in the last 168 hours. Anemia Panel: No results for input(s): VITAMINB12, FOLATE, FERRITIN, TIBC, IRON, RETICCTPCT in the last 168 hours. Urine Drug Screen: Drugs of Abuse  No results found for: LABOPIA, COCAINSCRNUR, LABBENZ, AMPHETMU, THCU, LABBARB  Alcohol Level: No results for input(s): ETH in the last 168 hours. Urinalysis: No results for input(s): COLORURINE, LABSPEC, PHURINE, GLUCOSEU, HGBUR, BILIRUBINUR, KETONESUR, PROTEINUR, UROBILINOGEN, NITRITE, LEUKOCYTESUR in the last 168 hours.  Invalid input(s): APPERANCEUR Misc. Labs:   Micro Results: No results found for this or  any previous visit (from the past 240 hour(s)). Studies/Results: Dg Chest 2 View  06/10/2015  CLINICAL DATA:  Dizziness, blurred vision, near syncope 1 week ago, chest heaviness, chest pain EXAM: CHEST  2 VIEW COMPARISON:  02/03/2015 FINDINGS: Cardiomediastinal silhouette is stable. Status post median sternotomy. Again noted aneurysmal dilatation of ascending aorta and aortic arch. Atherosclerotic calcifications of thoracic aorta again noted. No infiltrate or pulmonary edema. IMPRESSION: No infiltrate or pulmonary  edema. Again noted aneurysmal dilatation of ascending aorta and aortic arch. Status post median sternotomy. Electronically Signed   By: Lahoma Crocker M.D.   On: 06/10/2015 13:48   Ct Angio Chest Aorta W/cm &/or Wo/cm  06/10/2015  CLINICAL DATA:  Rule out aortic dissection EXAM: CT ANGIOGRAPHY CHEST, ABDOMEN AND PELVIS TECHNIQUE: Multidetector CT imaging through the chest, abdomen and pelvis was performed using the standard protocol during bolus administration of intravenous contrast. Multiplanar reconstructed images and MIPs were obtained and reviewed to evaluate the vascular anatomy. CONTRAST:  187mL OMNIPAQUE IOHEXOL 350 MG/ML SOLN COMPARISON:  11/06/2014 FINDINGS: CTA CHEST FINDINGS Mediastinum: Normal heart size. Previous median sternotomy and aortic valve repair. Aortic atherosclerosis identified calcification involving the LAD and left circumflex coronary artery identified. The ascending thoracic aorta measures 4.7 cm in diameter. The transverse aortic arch measures 4.2 cm. The descending thoracic aorta measures 5 cm and maximum dimension. There is no aortic dissection. The trachea is patent and is midline. Normal appearance of the esophagus. There is no mediastinal or hilar adenopathy. Lungs/Pleura: No pleural effusion identified. No suspicious nodule or identified. No airspace consolidation or pneumothorax. Musculoskeletal: No acute bone abnormality identified. Review of the MIP images confirms the above findings. CTA ABDOMEN AND PELVIS FINDINGS Hepatobiliary: The adrenal glands appear within normal limits. No suspicious liver abnormality identified. Stone is noted within the gallbladder measuring 1 cm. Pancreas: Negative Spleen: The spleen appears normal. Adrenals/Urinary Tract: The adrenal glands are normal. Bilateral renal cysts are noted. No obstructive uropathy. The urinary bladder appears normal. Stomach/Bowel: The stomach is normal. The small bowel loops have a normal caliber. Postoperative changes  from a left lower quadrant colostomy identified. Numerous colonic diverticula noted. Vascular/Lymphatic: Aortic atherosclerosis noted. The suprarenal abdominal aorta measures up to 3 cm. The infrarenal abdominal aorta has a maximum AP dimension of 2.6 cm. No upper abdominal or pelvic adenopathy. Reproductive: Mild prostate gland enlargement. Symmetric appearance of the seminal vesicles. Other: No free fluid or fluid collections within the abdomen or pelvis. Musculoskeletal: The bones are osteopenic. There is degenerative disc disease noted within the lumbar spine. Review of the MIP images confirms the above findings. IMPRESSION: 1. No evidence for aortic dissection. 2. Aortic atherosclerosis and multi vessel coronary artery disease. 3. Ascending thoracic aortic aneurysm. Recommend semi-annual imaging followup by CTA or MRA and referral to cardiothoracic surgery if not already obtained. This recommendation follows 2010 ACCF/AHA/AATS/ACR/ASA/SCA/SCAI/SIR/STS/SVM Guidelines for the Diagnosis and Management of Patients With Thoracic Aortic Disease. Circulation. 2010; 121: [id number] 4. Ectatic abdominal aorta at risk for aneurysm development. Recommend follow up by [place] in 5 years. This recommendation follows ACR consensus guidelines: White Paper of the ACR Incidental Findings Committee II on Vascular Findings. J Am Coll Radiol 2013; 10:789-794. 5. Gallstones Electronically Signed   By: Kerby Moors M.D.   On: 06/10/2015 17:24   Ct Cta Abd/pel W/cm &/or W/o Cm  06/10/2015  CLINICAL DATA:  Rule out aortic dissection EXAM: CT ANGIOGRAPHY CHEST, ABDOMEN AND PELVIS TECHNIQUE: Multidetector CT imaging through the chest, abdomen and pelvis was performed using  the standard protocol during bolus administration of intravenous contrast. Multiplanar reconstructed images and MIPs were obtained and reviewed to evaluate the vascular anatomy. CONTRAST:  119mL OMNIPAQUE IOHEXOL 350 MG/ML SOLN COMPARISON:  11/06/2014 FINDINGS: CTA  CHEST FINDINGS Mediastinum: Normal heart size. Previous median sternotomy and aortic valve repair. Aortic atherosclerosis identified calcification involving the LAD and left circumflex coronary artery identified. The ascending thoracic aorta measures 4.7 cm in diameter. The transverse aortic arch measures 4.2 cm. The descending thoracic aorta measures 5 cm and maximum dimension. There is no aortic dissection. The trachea is patent and is midline. Normal appearance of the esophagus. There is no mediastinal or hilar adenopathy. Lungs/Pleura: No pleural effusion identified. No suspicious nodule or identified. No airspace consolidation or pneumothorax. Musculoskeletal: No acute bone abnormality identified. Review of the MIP images confirms the above findings. CTA ABDOMEN AND PELVIS FINDINGS Hepatobiliary: The adrenal glands appear within normal limits. No suspicious liver abnormality identified. Stone is noted within the gallbladder measuring 1 cm. Pancreas: Negative Spleen: The spleen appears normal. Adrenals/Urinary Tract: The adrenal glands are normal. Bilateral renal cysts are noted. No obstructive uropathy. The urinary bladder appears normal. Stomach/Bowel: The stomach is normal. The small bowel loops have a normal caliber. Postoperative changes from a left lower quadrant colostomy identified. Numerous colonic diverticula noted. Vascular/Lymphatic: Aortic atherosclerosis noted. The suprarenal abdominal aorta measures up to 3 cm. The infrarenal abdominal aorta has a maximum AP dimension of 2.6 cm. No upper abdominal or pelvic adenopathy. Reproductive: Mild prostate gland enlargement. Symmetric appearance of the seminal vesicles. Other: No free fluid or fluid collections within the abdomen or pelvis. Musculoskeletal: The bones are osteopenic. There is degenerative disc disease noted within the lumbar spine. Review of the MIP images confirms the above findings. IMPRESSION: 1. No evidence for aortic dissection. 2.  Aortic atherosclerosis and multi vessel coronary artery disease. 3. Ascending thoracic aortic aneurysm. Recommend semi-annual imaging followup by CTA or MRA and referral to cardiothoracic surgery if not already obtained. This recommendation follows 2010 ACCF/AHA/AATS/ACR/ASA/SCA/SCAI/SIR/STS/SVM Guidelines for the Diagnosis and Management of Patients With Thoracic Aortic Disease. Circulation. 2010; 121: [id number] 4. Ectatic abdominal aorta at risk for aneurysm development. Recommend follow up by [place] in 5 years. This recommendation follows ACR consensus guidelines: White Paper of the ACR Incidental Findings Committee II on Vascular Findings. J Am Coll Radiol 2013; 10:789-794. 5. Gallstones Electronically Signed   By: Kerby Moors M.D.   On: 06/10/2015 17:24   Medications: I have reviewed the patient's current medications. Scheduled Meds: . aspirin EC  81 mg Oral Daily  . enoxaparin (LOVENOX) injection  40 mg Subcutaneous Q24H  . insulin aspart  0-9 Units Subcutaneous TID WC  . insulin detemir  20 Units Subcutaneous QHS  . metoprolol tartrate  12.5 mg Oral BID   Continuous Infusions:  PRN Meds:. Assessment/Plan: Principal Problem:   Near syncope Active Problems:   Diabetes mellitus ([place])   Aortic valve disorder   Carotid artery disease ([facility])   HTN (hypertension)   Colostomy in place [facility])  Jonathan Ryan is a 82 yo male with a past medical history of CAD, HFpEF, aortic aneurysm, aortic valve replacement, hypertension, peripheral vascular disease, DM 2, presenting to the hospital complaining of a pre-syncopal episode.  Pre-syncope: Patient reports having 2 episodes in the past few weeks. No prodomal symptoms. No loss of consciousness. Does report having palpitations which are concerning for arrythmia. Sick sinus syndrome is also on the differential.  His symptoms are less likely to be neurological as  he has no history of seizures or focal neurological deficits on exam.  Troponins and telemetry  negative overnight.  Cardiology recommending outpatient 30 day event monitor and possibly further titration of beta blocker. - Cardiology following, outpatient 30 day event monitor  CAD, HTN, HFpEF, AS s/p Bovine Prosthesis: Echo shows EF 55% and LVH and elevated PA pressures.  Cath in 2013 with 20% stenosis of LAD and proximal LCx and 30% stenosis of mid-LCx.  Prominent atherosclerosis on CT. -Aspirin 81 mg daily -Metoprolol 12.5 mg BID -Recommend PCP consider Zetia for lipid management  Aortic Aneurysms: Ascending thoracic aorta at 4.7 cm, transverse aortic arch at 4.2 cm, and descending thoracic aorta at 5 cm.  - Semi-annual imaging  DM2: No A1c value in Epic.  -SSI-sensitive -Levemir 20 U qHS  DVT/ PE ppx: SCDs  Diet: HH  Code: FULL (confirmed with patient)   Dispo: Disposition is deferred at this time, awaiting improvement of current medical problems.  Anticipated discharge in approximately 1 day(s).   The patient does have a current PCP Ronita Hipps, MD) and does need an [facility] hospital follow-up appointment after discharge.  The patient does not have transportation limitations that hinder transportation to clinic appointments.  .Services Needed at time of discharge: Y = Yes, Blank = No PT:   OT:   RN:   Equipment:   Other:       Iline Oven, MD 06/11/2015, 12:46 PM

## 2015-06-11 NOTE — Discharge Instructions (Signed)
1. Follow up with Cardiology for placement and reading of Event monitor.   Cardiac Event Monitoring A cardiac event monitor is a small recording device used to help detect abnormal heart rhythms (arrhythmias). The monitor is used to record heart rhythm when noticeable symptoms such as the following occur:  Fast heartbeats (palpitations), such as heart racing or fluttering.  Dizziness.  Fainting or light-headedness.  Unexplained weakness. The monitor is wired to two electrodes placed on your chest. Electrodes are flat, sticky disks that attach to your skin. The monitor can be worn for up to 30 days. You will wear the monitor at all times, except when bathing.  HOW TO USE YOUR CARDIAC EVENT MONITOR A technician will prepare your chest for the electrode placement. The technician will show you how to place the electrodes, how to work the monitor, and how to replace the batteries. Take time to practice using the monitor before you leave the office. Make sure you understand how to send the information from the monitor to your health care provider. This requires a telephone with a landline, not a cell phone. You need to:  Wear your monitor at all times, except when you are in water:  Do not get the monitor wet.  Take the monitor off when bathing. Do not swim or use a hot tub with it on.  Keep your skin clean. Do not put body lotion or moisturizer on your chest.  Change the electrodes daily or any time they stop sticking to your skin. You might need to use tape to keep them on.  It is possible that your skin under the electrodes could become irritated. To keep this from happening, try to put the electrodes in slightly different places on your chest. However, they must remain in the area under your left breast and in the upper right section of your chest.  Make sure the monitor is safely clipped to your clothing or in a location close to your body that your health care provider  recommends.  Press the button to record when you feel symptoms of heart trouble, such as dizziness, weakness, light-headedness, palpitations, thumping, shortness of breath, unexplained weakness, or a fluttering or racing heart. The monitor is always on and records what happened slightly before you pressed the button, so do not worry about being too late to get good information.  Keep a diary of your activities, such as walking, doing chores, and taking medicine. It is especially important to note what you were doing when you pushed the button to record your symptoms. This will help your health care provider determine what might be contributing to your symptoms. The information stored in your monitor will be reviewed by your health care provider alongside your diary entries.  Send the recorded information as recommended by your health care provider. It is important to understand that it will take some time for your health care provider to process the results.  Change the batteries as recommended by your health care provider. SEEK IMMEDIATE MEDICAL CARE IF:   You have chest pain.  You have extreme difficulty breathing or shortness of breath.  You develop a very fast heartbeat that persists.  You develop dizziness that does not go away.  You faint or constantly feel you are about to faint.   This information is not intended to replace advice given to you by your health care provider. Make sure you discuss any questions you have with your health care provider.   Document Released: 01/06/2008  Document Revised: 04/19/2014 Document Reviewed: 09/25/2012 Elsevier Interactive Patient Education [employer].   Please obtain all of your results from medical records or have your doctors office obtain the results - share them with your doctor - you should be seen at your doctors office in the next 2 days. Call today to arrange your follow up. Take the medications as prescribed. Please review all  of the medicines and only take them if you do not have an allergy to them. Please be aware that if you are taking birth control pills, taking other prescriptions, ESPECIALLY ANTIBIOTICS may make the birth control ineffective - if this is the case, either do not engage in sexual activity or use alternative methods of birth control such as condoms until you have finished the medicine and your family doctor says it is OK to restart them. If you are on a blood thinner such as COUMADIN, be aware that any other medicine that you take may cause the coumadin to either work too much, or not enough - you should have your coumadin level rechecked in next 7 days if this is the case.  ?  It is also a possibility that you have an allergic reaction to any of the medicines that you have been prescribed - Everybody reacts differently to medications and while MOST people have no trouble with most medicines, you may have a reaction such as nausea, vomiting, rash, swelling, shortness of breath. If this is the case, please stop taking the medicine immediately and contact your physician.  ?  You should return to the ER if you develop severe or worsening symptoms.

## 2015-06-11 NOTE — Consult Note (Signed)
Cardiology Consult    Patient ID: Jonathan Ryan MRN: [id number], DOB/AGE: 09-28-1928   Admit date: 06/10/2015 Date of Consult: 06/11/2015  Primary Physician: Ronita Hipps, MD Reason for Consult: Syncope Primary Cardiologist: [facility] Requesting Provider: Dr. Eppie Gibson   History of Present Illness    Jonathan Ryan is a 82 y.o. male with past medical history of CAD, HTN, chronic diastolic CHF, Type 2 DM, Aortic stenosis (s/p AVR in 2013), thoracic aortic ascending aneurysm, and descending thoracic aortic aneurysm who presented to [facility] ED on 06/10/2015 for pre-syncope.  He was seen at [facility] for Pre-syncope on 06/07/2015. He reported several episodes of pre-syncope over the past 2 weeks and they attributed this to neurocardiogenic syncope as his episodes typically occured with increased activity or changes in position. Orthostatics were checked during that visit and were 101/65 while lying, 84/58 while sitting, and 98/60 while standing.  In talking with the patient and his girlfriend at the bedside, they both seemed confused about being seen at [facility] on 06/07/2015 and said this encounter did not occur. Then the patient said, "well I did go to the ED and they monitored me for awhile". His girlfriend expresses concern about not wanting to be seen by [facility] in the future and wanting to establish care with [facility]. He has previously been seen by Dr. Geraldo Pitter at [facility] whom which it appears manages the patient from a full Cardiology perspective but the patient's girlfriend says he only follows his aneurysms. She is very concerned about the patient's pre-syncopal episodes and states that "something must be done this hospitalization for he cannot live like this".   In relation to the patient's pre-syncope, he reports two main episodes of blurry vision which have occurred while driving. He denies any associated lightheadedness, dizziness, diaphoresis, nausea, vomiting, chest pain, or palpitations. The  symptoms last for less than 30 seconds. He does report feeling lightheaded at times when going from lying down to standing up, but when this occurs there are no vision changes.   He reports having a history of an "extra heart beat" but is unaware of any history of atrial fibrillation. Did wear a 24-hour cardiac monitor 2+ years ago which he says showed no irregularities. Reports having a history of HLD but has refused to take any statins and he remains adamant about this at the time of this encounter.   Care Everywhere mentions a history of mild CAD with cardiac catheterization in 2013 showing 20% stenosis in the LAD and proximal and 30% stenosis in the Mid-LCx. Again, he denies any recent chest pain, dyspnea with exertion, or anginal equivalents.  He also had a recent echocardiogram on 06/08/2015 while at [facility] which showed an EF of > 55%. AV Mean gradient was 12. AV peak velocity 2.2. Degenerative mitral valve disease was noted along with moderate TR.  This admission, his CBC without significant abnormalities. Creatinine 0.91. No electrolyte abnormalities noted. Cyclic troponin values have been negative thus far. TSH is pending. His telemetry has shown NSR with occasional PVS'c. No evidence of atrial fibrillation is noted.  Past Medical History   Past Medical History  Diagnosis Date  . Coronary artery disease   . Hypertension   . CHF (congestive heart failure) ([place])   . Diabetes mellitus without complication ([place])   . Diverticular disease of left colon   . Thoracic ascending aortic aneurysm ([place])   . Descending thoracic aortic aneurysm [facility])     Past Surgical History  Procedure Laterality  Date  . Aortic valve repair    . Colon surgery       Allergies  Allergies  Allergen Reactions  . Penicillins   . Vancomycin   . Ramipril Rash    Inpatient Medications    . aspirin EC  81 mg Oral Daily  . enoxaparin (LOVENOX) injection  40 mg Subcutaneous Q24H  . insulin aspart  0-9 Units  Subcutaneous TID WC  . insulin detemir  20 Units Subcutaneous QHS  . metoprolol tartrate  12.5 mg Oral BID    Family History    Family History  Problem Relation Age of Onset  . Heart attack Father     Social History    Social History   Social History  . Marital Status: Single    Spouse Name: N/A  . Number of Children: N/A  . Years of Education: N/A   Occupational History  . Not on file.   Social History Main Topics  . Smoking status: Former Smoker    Types: Cigarettes  . Smokeless tobacco: Not on file     Comment: Smoked 2 packs/ week as a teenager   . Alcohol Use: No  . Drug Use: No  . Sexual Activity: Not on file   Other Topics Concern  . Not on file   Social History Narrative     Review of Systems    General:  No chills, fever, night sweats or weight changes. Positive for fatigue. Cardiovascular:  No chest pain, dyspnea on exertion, edema, orthopnea, palpitations, paroxysmal nocturnal dyspnea. Dermatological: No rash, lesions/masses Respiratory: No cough, dyspnea Urologic: No hematuria, dysuria Abdominal:   No nausea, vomiting, diarrhea, bright red blood per rectum, melena, or hematemesis Neurologic:  No wkns, changes in mental status. Positive for blurry vision and lightheadedness. All other systems reviewed and are otherwise negative except as noted above.  Physical Exam    Blood pressure 123/70, pulse 62, temperature 97.6 F (36.4 C), temperature source Oral, resp. rate 17, height 5\' 9"  (1.753 m), weight 182 lb 11.2 oz (82.872 kg), SpO2 96 %.  General: Pleasant, elderly Caucasian male appearing in NAD Psych: Normal affect. Neuro: Alert and oriented X 3. Moves all extremities spontaneously. HEENT: Normal  Neck: Supple without bruits or JVD. Lungs:  Resp regular and unlabored, CTA without wheezing or rales. Heart: RRR no s3, s4, 2/6 SEM at RUSB. Crisp valve sounds present. Abdomen: Soft, non-tender, non-distended, BS + x 4.  Extremities: No clubbing,  cyanosis or edema. DP/PT/Radials 2+ and equal bilaterally.  Labs    Troponin [facility])  Recent Labs  06/10/15 1317  TROPIPOC 0.00    Recent Labs  06/10/15 2340 06/11/15 0450  TROPONINI <0.03 <0.03   Lab Results  Component Value Date   WBC 7.0 06/10/2015   HGB 14.5 06/10/2015   HCT 43.4 06/10/2015   MCV 93.3 06/10/2015   PLT 161 06/10/2015     Recent Labs Lab 06/10/15 1304  NA 141  K 4.2  CL 105  CO2 27  BUN 12  CREATININE 0.91  CALCIUM 9.7  GLUCOSE 249*   No results found for: CHOL, HDL, LDLCALC, TRIG No results found for: [facility]   Radiology Studies    Dg Chest 2 View: 06/10/2015  CLINICAL DATA:  Dizziness, blurred vision, near syncope 1 week ago, chest heaviness, chest pain EXAM: CHEST  2 VIEW COMPARISON:  02/03/2015 FINDINGS: Cardiomediastinal silhouette is stable. Status post median sternotomy. Again noted aneurysmal dilatation of ascending aorta and aortic arch.  Atherosclerotic calcifications of thoracic aorta again noted. No infiltrate or pulmonary edema. IMPRESSION: No infiltrate or pulmonary edema. Again noted aneurysmal dilatation of ascending aorta and aortic arch. Status post median sternotomy. Electronically Signed   By: Lahoma Crocker M.D.   On: 06/10/2015 13:48    Ct Cta Abd/pel W/cm &/or W/o Cm: 06/10/2015  CLINICAL DATA:  Rule out aortic dissection EXAM: CT ANGIOGRAPHY CHEST, ABDOMEN AND PELVIS TECHNIQUE: Multidetector CT imaging through the chest, abdomen and pelvis was performed using the standard protocol during bolus administration of intravenous contrast. Multiplanar reconstructed images and MIPs were obtained and reviewed to evaluate the vascular anatomy. CONTRAST:  176mL OMNIPAQUE IOHEXOL 350 MG/ML SOLN COMPARISON:  11/06/2014 FINDINGS: CTA CHEST FINDINGS Mediastinum: Normal heart size. Previous median sternotomy and aortic valve repair. Aortic atherosclerosis identified calcification involving the LAD and left circumflex coronary artery  identified. The ascending thoracic aorta measures 4.7 cm in diameter. The transverse aortic arch measures 4.2 cm. The descending thoracic aorta measures 5 cm and maximum dimension. There is no aortic dissection. The trachea is patent and is midline. Normal appearance of the esophagus. There is no mediastinal or hilar adenopathy. Lungs/Pleura: No pleural effusion identified. No suspicious nodule or identified. No airspace consolidation or pneumothorax. Musculoskeletal: No acute bone abnormality identified. Review of the MIP images confirms the above findings. CTA ABDOMEN AND PELVIS FINDINGS Hepatobiliary: The adrenal glands appear within normal limits. No suspicious liver abnormality identified. Stone is noted within the gallbladder measuring 1 cm. Pancreas: Negative Spleen: The spleen appears normal. Adrenals/Urinary Tract: The adrenal glands are normal. Bilateral renal cysts are noted. No obstructive uropathy. The urinary bladder appears normal. Stomach/Bowel: The stomach is normal. The small bowel loops have a normal caliber. Postoperative changes from a left lower quadrant colostomy identified. Numerous colonic diverticula noted. Vascular/Lymphatic: Aortic atherosclerosis noted. The suprarenal abdominal aorta measures up to 3 cm. The infrarenal abdominal aorta has a maximum AP dimension of 2.6 cm. No upper abdominal or pelvic adenopathy. Reproductive: Mild prostate gland enlargement. Symmetric appearance of the seminal vesicles. Other: No free fluid or fluid collections within the abdomen or pelvis. Musculoskeletal: The bones are osteopenic. There is degenerative disc disease noted within the lumbar spine. Review of the MIP images confirms the above findings. IMPRESSION: 1. No evidence for aortic dissection. 2. Aortic atherosclerosis and multi vessel coronary artery disease. 3. Ascending thoracic aortic aneurysm. Recommend semi-annual imaging followup by CTA or MRA and referral to cardiothoracic surgery if not  already obtained. This recommendation follows 2010 ACCF/AHA/AATS/ACR/ASA/SCA/SCAI/SIR/STS/SVM Guidelines for the Diagnosis and Management of Patients With Thoracic Aortic Disease. Circulation. 2010; 121: [id number] 4. Ectatic abdominal aorta at risk for aneurysm development. Recommend follow up by [place] in 5 years. This recommendation follows ACR consensus guidelines: White Paper of the ACR Incidental Findings Committee II on Vascular Findings. J Am Coll Radiol 2013; 10:789-794. 5. Gallstones Electronically Signed   By: Kerby Moors M.D.   On: 06/10/2015 17:24    EKG & Cardiac Imaging    EKG: NSR, HR 62, PVC's.  Echocardiogram: 06/08/2015 - From Care Everywhere    Aortic valve replacement (23 mm Edwards tissue valve, 2013)  Left ventricular hypertrophy - mild  Normal left ventricular systolic function, ejection fraction > 55%  Mitral annular calcification  Degenerative mitral valve disease  Dilated left atrium - mild  Dilated ascending aorta  Elevated pulmonary artery systolic pressure - mild  Normal right ventricular systolic function  Tricuspid regurgitation - moderate  Dilated right atrium - mild  Assessment &  Plan    1. Pre-Syncope - reports two main episodes of blurry vision, lasting for less than 30 seconds, which have occurred while driving, with the last episode occurring 06/06/2015. He denies any associated symptoms during those times. Does report feeling lightheaded at times when changing positions. - reports a history of an "extra heart beat" but is unaware of any history of atrial fibrillation.  - TSH is pending.  - will recheck orthostatic vital signs. - consider a 30-day event monitor as outpatient. Telemetry has shown occasional PVC's but no other atopic events. Could consider titration of his BB dose (BP has been 122/69 - 153/93 while admitted) to help suppress the ectopy.  2. Mild CAD by cath in 2013 - cardiac catheterization in 2013 showed 20% stenosis in the  LAD and proximal and 30% stenosis in the Mid-LCx. - recent CT showed aortic atherosclerosis and multi-vessel coronary artery disease.  - continue ASA and BB. The patient refuses to be on a statin medication. Will check a lipid panel for risk stratification. Consider the addition of Zetia if the patient is willing to consider an alternative to statins. - denies any recent chest pain or anginal symptoms.  - could consider outpatient Lexiscan Myoview.  3. Aortic Stenosis - s/p AVR in 2013 at [place]. Ascending and Descending Thoracic Aortic Aneurysms - CT imaging this admission shows the ascending thoracic aorta measuring 4.7 cm in diameter, the transverse aortic arch measuring 4.2 cm, and the descending thoracic aorta measuring 5 cm.There is no aortic dissection noted. - semi-annual imaging follow-up was recommended.  5. Chronic Diastolic CHF - recent echo showed preserved EF of 55%. - was on Lopressor 12.5mg  BID and Lasix 20mg  daily prior to admission. - does not appear volume overloaded on physical examination.  Signed, Erma Heritage, PA-C 06/11/2015, 9:55 AM Pager: [phone] The patient has been seen in conjunction with Bernerd Pho, PA-C. All aspects of care have been considered and discussed. The patient has been personally interviewed, examined, and all clinical data has been reviewed.   82 year old gentleman with history of aortic valve SAVR, nonobstructive CAD, and ascending aortic aneurysm (4.7 cm) presenting with vague complaints of dizziness/impaired vision/near syncope. Seen for the same complaint in the [facility] system recently. No current complaints. Girlfriend dominates the conversation. No chest discomfort or dyspnea. Concerned about fluctuating blood pressures, occasionally less than 123XX123 mmHg systolic.  Exam reveals a soft systolic murmur without diastolic component. No focal neurological deficits.  There is no acute cardiac problem at this time. There  appears to be a significant component of concern. Given his age and history of aortic valve disease, transient episodes of bradycardia resulting in near syncope need to be excluded. I would therefore recommend a thirty-day continuous ambulatory monitor. Given the recent workup at [facility], I do not believe any further extensive evaluation is needed here.  He should follow-up with his primary cardiologist at [facility], [place].

## 2015-06-11 NOTE — Discharge Summary (Signed)
Name: Jonathan Ryan MRN: [id number] DOB: 26-Nov-1928 82 y.o. PCP: Jonathan Hipps, MD  Date of Admission: 06/10/2015  2:50 PM Date of Discharge: 06/11/2015 Attending Physician: Oval Linsey, MD  Discharge Diagnosis: 1. Presyncope   Principal Problem:   Near syncope Active Problems:   Diabetes mellitus ([place])   Aortic valve disorder   Carotid artery disease (HCC)   HTN (hypertension)   Colostomy in place [facility])  Discharge Medications:   Medication List    TAKE these medications        aspirin EC 81 MG tablet  Take 81 mg by mouth daily.     finasteride 1 MG tablet  Commonly known as:  PROPECIA  Take 1 mg by mouth daily.     furosemide 20 MG tablet  Commonly known as:  LASIX  Take 20 mg by mouth daily.     glimepiride 1 MG tablet  Commonly known as:  AMARYL  Take 2 mg by mouth 2 (two) times daily.     LEVEMIR 100 UNIT/ML injection  Generic drug:  insulin detemir  Inject 25 Units into the skin at bedtime.     metFORMIN 500 MG tablet  Commonly known as:  GLUCOPHAGE  Take 1,000 mg by mouth 2 (two) times daily.     metoprolol tartrate 25 MG tablet  Commonly known as:  LOPRESSOR  Take 12.5 mg by mouth 2 (two) times daily.     MULTI-VITAMINS Tabs  Take 2 tablets by mouth daily.     promethazine-dextromethorphan 6.25-15 MG/5ML syrup  Commonly known as:  PROMETHAZINE-DM  Take 5 mLs by mouth 4 (four) times daily as needed for cough.     vitamin C 1000 MG tablet  Take 1,000 mg by mouth daily.     zinc gluconate 50 MG tablet  Take 50 mg by mouth daily.        Disposition and follow-up:   [name] was discharged from [facility] in Stable condition.  At the hospital follow up visit please address:  1.  Event monitor, repeat near syncope symptoms, lipids and need for Zetia  2.  Labs / imaging needed at time of follow-up: Event monitor  3.  Pending labs/ test needing follow-up: Event monitor  Follow-up Appointments: Follow-up  Information    Follow up with Jonathan Hipps, MD. Call on 06/19/2015.   Specialty:  Family Medicine   Why:  2:30p   Contact information:   [place] [phone]       Schedule an appointment as soon as possible for a visit with [facility].   Why:  event monitor   Contact information:   [place] [phone]      Discharge Instructions: Discharge Instructions    Call MD for:  extreme fatigue    Complete by:  As directed      Call MD for:  persistant dizziness or light-headedness    Complete by:  As directed      Diet - low sodium heart healthy    Complete by:  As directed      Increase activity slowly    Complete by:  As directed            Consultations: Treatment Team:  Rounding L[facility]iology, MD  Procedures Performed:  Dg Chest 2 View  06/10/2015  CLINICAL DATA:  Dizziness, blurred vision, near syncope 1 week ago, chest heaviness, chest  pain EXAM: CHEST  2 VIEW COMPARISON:  02/03/2015 FINDINGS: Cardiomediastinal silhouette is stable. Status post median sternotomy. Again noted aneurysmal dilatation of ascending aorta and aortic arch. Atherosclerotic calcifications of thoracic aorta again noted. No infiltrate or pulmonary edema. IMPRESSION: No infiltrate or pulmonary edema. Again noted aneurysmal dilatation of ascending aorta and aortic arch. Status post median sternotomy. Electronically Signed   By: Lahoma Crocker M.D.   On: 06/10/2015 13:48   Ct Angio Chest Aorta W/cm &/or Wo/cm  06/10/2015  CLINICAL DATA:  Rule out aortic dissection EXAM: CT ANGIOGRAPHY CHEST, ABDOMEN AND PELVIS TECHNIQUE: Multidetector CT imaging through the chest, abdomen and pelvis was performed using the standard protocol during bolus administration of intravenous contrast. Multiplanar reconstructed images and MIPs were obtained and reviewed to evaluate the vascular anatomy.  CONTRAST:  111mL OMNIPAQUE IOHEXOL 350 MG/ML SOLN COMPARISON:  11/06/2014 FINDINGS: CTA CHEST FINDINGS Mediastinum: Normal heart size. Previous median sternotomy and aortic valve repair. Aortic atherosclerosis identified calcification involving the LAD and left circumflex coronary artery identified. The ascending thoracic aorta measures 4.7 cm in diameter. The transverse aortic arch measures 4.2 cm. The descending thoracic aorta measures 5 cm and maximum dimension. There is no aortic dissection. The trachea is patent and is midline. Normal appearance of the esophagus. There is no mediastinal or hilar adenopathy. Lungs/Pleura: No pleural effusion identified. No suspicious nodule or identified. No airspace consolidation or pneumothorax. Musculoskeletal: No acute bone abnormality identified. Review of the MIP images confirms the above findings. CTA ABDOMEN AND PELVIS FINDINGS Hepatobiliary: The adrenal glands appear within normal limits. No suspicious liver abnormality identified. Stone is noted within the gallbladder measuring 1 cm. Pancreas: Negative Spleen: The spleen appears normal. Adrenals/Urinary Tract: The adrenal glands are normal. Bilateral renal cysts are noted. No obstructive uropathy. The urinary bladder appears normal. Stomach/Bowel: The stomach is normal. The small bowel loops have a normal caliber. Postoperative changes from a left lower quadrant colostomy identified. Numerous colonic diverticula noted. Vascular/Lymphatic: Aortic atherosclerosis noted. The suprarenal abdominal aorta measures up to 3 cm. The infrarenal abdominal aorta has a maximum AP dimension of 2.6 cm. No upper abdominal or pelvic adenopathy. Reproductive: Mild prostate gland enlargement. Symmetric appearance of the seminal vesicles. Other: No free fluid or fluid collections within the abdomen or pelvis. Musculoskeletal: The bones are osteopenic. There is degenerative disc disease noted within the lumbar spine. Review of the MIP  images confirms the above findings. IMPRESSION: 1. No evidence for aortic dissection. 2. Aortic atherosclerosis and multi vessel coronary artery disease. 3. Ascending thoracic aortic aneurysm. Recommend semi-annual imaging followup by CTA or MRA and referral to cardiothoracic surgery if not already obtained. This recommendation follows 2010 ACCF/AHA/AATS/ACR/ASA/SCA/SCAI/SIR/STS/SVM Guidelines for the Diagnosis and Management of Patients With Thoracic Aortic Disease. Circulation. 2010; 121: [id number] 4. Ectatic abdominal aorta at risk for aneurysm development. Recommend follow up by [place] in 5 years. This recommendation follows ACR consensus guidelines: White Paper of the ACR Incidental Findings Committee II on Vascular Findings. J Am Coll Radiol 2013; 10:789-794. 5. Gallstones Electronically Signed   By: Kerby Moors M.D.   On: 06/10/2015 17:24   Ct Cta Abd/pel W/cm &/or W/o Cm  06/10/2015  CLINICAL DATA:  Rule out aortic dissection EXAM: CT ANGIOGRAPHY CHEST, ABDOMEN AND PELVIS TECHNIQUE: Multidetector CT imaging through the chest, abdomen and pelvis was performed using the standard protocol during bolus administration of intravenous contrast. Multiplanar reconstructed images and MIPs were obtained and reviewed to evaluate the vascular anatomy. CONTRAST:  142mL OMNIPAQUE IOHEXOL 350 MG/ML  SOLN COMPARISON:  11/06/2014 FINDINGS: CTA CHEST FINDINGS Mediastinum: Normal heart size. Previous median sternotomy and aortic valve repair. Aortic atherosclerosis identified calcification involving the LAD and left circumflex coronary artery identified. The ascending thoracic aorta measures 4.7 cm in diameter. The transverse aortic arch measures 4.2 cm. The descending thoracic aorta measures 5 cm and maximum dimension. There is no aortic dissection. The trachea is patent and is midline. Normal appearance of the esophagus. There is no mediastinal or hilar adenopathy. Lungs/Pleura: No pleural effusion identified. No suspicious  nodule or identified. No airspace consolidation or pneumothorax. Musculoskeletal: No acute bone abnormality identified. Review of the MIP images confirms the above findings. CTA ABDOMEN AND PELVIS FINDINGS Hepatobiliary: The adrenal glands appear within normal limits. No suspicious liver abnormality identified. Stone is noted within the gallbladder measuring 1 cm. Pancreas: Negative Spleen: The spleen appears normal. Adrenals/Urinary Tract: The adrenal glands are normal. Bilateral renal cysts are noted. No obstructive uropathy. The urinary bladder appears normal. Stomach/Bowel: The stomach is normal. The small bowel loops have a normal caliber. Postoperative changes from a left lower quadrant colostomy identified. Numerous colonic diverticula noted. Vascular/Lymphatic: Aortic atherosclerosis noted. The suprarenal abdominal aorta measures up to 3 cm. The infrarenal abdominal aorta has a maximum AP dimension of 2.6 cm. No upper abdominal or pelvic adenopathy. Reproductive: Mild prostate gland enlargement. Symmetric appearance of the seminal vesicles. Other: No free fluid or fluid collections within the abdomen or pelvis. Musculoskeletal: The bones are osteopenic. There is degenerative disc disease noted within the lumbar spine. Review of the MIP images confirms the above findings. IMPRESSION: 1. No evidence for aortic dissection. 2. Aortic atherosclerosis and multi vessel coronary artery disease. 3. Ascending thoracic aortic aneurysm. Recommend semi-annual imaging followup by CTA or MRA and referral to cardiothoracic surgery if not already obtained. This recommendation follows 2010 ACCF/AHA/AATS/ACR/ASA/SCA/SCAI/SIR/STS/SVM Guidelines for the Diagnosis and Management of Patients With Thoracic Aortic Disease. Circulation. 2010; 121: [id number] 4. Ectatic abdominal aorta at risk for aneurysm development. Recommend follow up by [place] in 5 years. This recommendation follows ACR consensus guidelines: White Paper of the ACR  Incidental Findings Committee II on Vascular Findings. J Am Coll Radiol 2013; 10:789-794. 5. Gallstones Electronically Signed   By: Kerby Moors M.D.   On: 06/10/2015 17:24    2D Echo:   Cardiac Cath:   Admission HPI: Patient is a 82 yo male with a past medical history of CAD, CHF, aortic aneurysm, aortic valve replacement, hypertension, peripheral vascular disease, DM 2, presenting to the hospital complaining of a pre-syncopal episode. Patient states he was driving his car 4 days ago when all of a sudden it felt like his "heart stopped" and he was going to "pass out." States he did not lose consciousness and continued to drive. He believes the episode resolved within a few seconds to a minute. Denies having any blackout, blurry vision, or double vision. States his vision was different and it felt as if "things were out of place." Reports feeling weak after this happened. Also reports having a similar episode in the past (also while driving) which was a few weeks ago. Patient did not seek any medical attention at that time. Denies having any headaches, history of seizures, or focal neurological symptoms. Denies having seizure-like symptoms during this episode. Denies having any chest pain, SOB, cough, nausea, or vomiting. Does report having heart palpitations which he believes are becoming more frequent over time. He is not sure if palpitations occur at rest or with activity. Reports having a  history of Afib but is not on any chronic anticoagulation. Patient does report not eating the morning this happened but does not think his symptoms are related to hypoglycemia because when he had a similar episode a few weeks ago, it was after lunchtime. Denies having any viral URI symptoms, no recent sick contacts. No syncopal episodes in the past. He is not sure of any family history of sudden cardiac death.  Hospital Course by problem list: Principal Problem:   Near syncope Active Problems:   Diabetes  mellitus ([place])   Aortic valve disorder   Carotid artery disease (HCC)   HTN (hypertension)   Colostomy in place [facility])   Near Syncope: Patient reported 2 episodes of near syncope in the last few weeks without prodromal symptoms.  His troponins and telemetry were negative. Cardiology consult did not recommend further inpatient workup, instead recommending outpatient 30 day event monitor.  Patient desires to follow up with [facility] for the event monitor.  Aortic Aneurysms: Ascending thoracic aorta at 4.7 cm, transverse aortic arch at 4.2 cm, and descending thoracic aorta at 5 cm. Semi-annual imaging recommended  Discharge Vitals:   BP 123/70 mmHg  Pulse 62  Temp(Src) 97.6 F (36.4 C) (Oral)  Resp 17  Ht 5\' 9"  (1.753 m)  Wt 182 lb 11.2 oz (82.872 kg)  BMI 26.97 kg/m2  SpO2 96%  Discharge Labs:  Results for orders placed or performed during the hospital encounter of 06/10/15 (from the past 24 hour(s))  Troponin I     Status: None   Collection Time: 06/10/15 11:40 PM  Result Value Ref Range   Troponin I <0.03 <0.031 ng/mL  Troponin I     Status: None   Collection Time: 06/11/15  4:50 AM  Result Value Ref Range   Troponin I <0.03 <0.031 ng/mL  Glucose, capillary     Status: Abnormal   Collection Time: 06/11/15  7:23 AM  Result Value Ref Range   Glucose-Capillary 120 (H) 65 - 99 mg/dL   Comment 1 Notify RN    Comment 2 Document in Chart   Troponin I     Status: None   Collection Time: 06/11/15 10:10 AM  Result Value Ref Range   Troponin I <0.03 <0.031 ng/mL  TSH     Status: None   Collection Time: 06/11/15 10:10 AM  Result Value Ref Range   TSH 2.996 0.350 - 4.500 uIU/mL  Glucose, capillary     Status: Abnormal   Collection Time: 06/11/15 11:32 AM  Result Value Ref Range   Glucose-Capillary 121 (H) 65 - 99 mg/dL    Signed: Iline Oven, MD 06/11/2015, 1:27 PM    Services Ordered on Discharge: none Equipment Ordered on Discharge: none

## 2015-06-16 ENCOUNTER — Ambulatory Visit (INDEPENDENT_AMBULATORY_CARE_PROVIDER_SITE_OTHER): Payer: PPO

## 2015-06-16 DIAGNOSIS — R55 Syncope and collapse: Secondary | ICD-10-CM

## 2015-07-03 DIAGNOSIS — J9811 Atelectasis: Secondary | ICD-10-CM | POA: Diagnosis not present

## 2015-07-03 DIAGNOSIS — K4090 Unilateral inguinal hernia, without obstruction or gangrene, not specified as recurrent: Secondary | ICD-10-CM | POA: Diagnosis not present

## 2015-07-03 DIAGNOSIS — E042 Nontoxic multinodular goiter: Secondary | ICD-10-CM | POA: Diagnosis not present

## 2015-07-03 DIAGNOSIS — I712 Thoracic aortic aneurysm, without rupture: Secondary | ICD-10-CM | POA: Diagnosis not present

## 2015-07-03 DIAGNOSIS — E041 Nontoxic single thyroid nodule: Secondary | ICD-10-CM | POA: Diagnosis not present

## 2015-07-17 DIAGNOSIS — Z933 Colostomy status: Secondary | ICD-10-CM | POA: Diagnosis not present

## 2015-08-01 DIAGNOSIS — I10 Essential (primary) hypertension: Secondary | ICD-10-CM | POA: Diagnosis not present

## 2015-08-01 DIAGNOSIS — I712 Thoracic aortic aneurysm, without rupture: Secondary | ICD-10-CM | POA: Diagnosis not present

## 2015-08-01 DIAGNOSIS — I517 Cardiomegaly: Secondary | ICD-10-CM | POA: Diagnosis not present

## 2015-08-01 DIAGNOSIS — H5340 Unspecified visual field defects: Secondary | ICD-10-CM | POA: Diagnosis not present

## 2015-08-01 DIAGNOSIS — E1142 Type 2 diabetes mellitus with diabetic polyneuropathy: Secondary | ICD-10-CM | POA: Diagnosis not present

## 2015-08-01 DIAGNOSIS — R079 Chest pain, unspecified: Secondary | ICD-10-CM | POA: Diagnosis not present

## 2015-08-01 DIAGNOSIS — R5381 Other malaise: Secondary | ICD-10-CM | POA: Diagnosis not present

## 2015-08-01 DIAGNOSIS — Z7984 Long term (current) use of oral hypoglycemic drugs: Secondary | ICD-10-CM | POA: Diagnosis not present

## 2015-08-01 DIAGNOSIS — E041 Nontoxic single thyroid nodule: Secondary | ICD-10-CM | POA: Diagnosis not present

## 2015-08-01 DIAGNOSIS — F0390 Unspecified dementia without behavioral disturbance: Secondary | ICD-10-CM | POA: Diagnosis not present

## 2015-08-01 DIAGNOSIS — I2510 Atherosclerotic heart disease of native coronary artery without angina pectoris: Secondary | ICD-10-CM | POA: Diagnosis not present

## 2015-08-01 DIAGNOSIS — R200 Anesthesia of skin: Secondary | ICD-10-CM | POA: Diagnosis not present

## 2015-08-01 DIAGNOSIS — K219 Gastro-esophageal reflux disease without esophagitis: Secondary | ICD-10-CM | POA: Diagnosis not present

## 2015-08-01 DIAGNOSIS — I6523 Occlusion and stenosis of bilateral carotid arteries: Secondary | ICD-10-CM | POA: Diagnosis not present

## 2015-08-01 DIAGNOSIS — Z952 Presence of prosthetic heart valve: Secondary | ICD-10-CM | POA: Diagnosis not present

## 2015-08-01 DIAGNOSIS — Z79899 Other long term (current) drug therapy: Secondary | ICD-10-CM | POA: Diagnosis not present

## 2015-08-01 DIAGNOSIS — I639 Cerebral infarction, unspecified: Secondary | ICD-10-CM | POA: Diagnosis not present

## 2015-08-01 DIAGNOSIS — Z7982 Long term (current) use of aspirin: Secondary | ICD-10-CM | POA: Diagnosis not present

## 2015-08-01 DIAGNOSIS — I779 Disorder of arteries and arterioles, unspecified: Secondary | ICD-10-CM | POA: Diagnosis not present

## 2015-08-01 DIAGNOSIS — I6502 Occlusion and stenosis of left vertebral artery: Secondary | ICD-10-CM | POA: Diagnosis not present

## 2015-08-01 DIAGNOSIS — H532 Diplopia: Secondary | ICD-10-CM | POA: Diagnosis not present

## 2015-08-02 DIAGNOSIS — I6502 Occlusion and stenosis of left vertebral artery: Secondary | ICD-10-CM | POA: Diagnosis not present

## 2015-08-02 DIAGNOSIS — E042 Nontoxic multinodular goiter: Secondary | ICD-10-CM | POA: Diagnosis not present

## 2015-08-04 DIAGNOSIS — J189 Pneumonia, unspecified organism: Secondary | ICD-10-CM | POA: Diagnosis not present

## 2015-08-05 ENCOUNTER — Encounter: Payer: PPO | Admitting: Vascular Surgery

## 2015-08-06 DIAGNOSIS — E119 Type 2 diabetes mellitus without complications: Secondary | ICD-10-CM | POA: Diagnosis not present

## 2015-08-06 DIAGNOSIS — I444 Left anterior fascicular block: Secondary | ICD-10-CM | POA: Diagnosis not present

## 2015-08-06 DIAGNOSIS — I2510 Atherosclerotic heart disease of native coronary artery without angina pectoris: Secondary | ICD-10-CM | POA: Diagnosis not present

## 2015-08-06 DIAGNOSIS — Z7984 Long term (current) use of oral hypoglycemic drugs: Secondary | ICD-10-CM | POA: Diagnosis not present

## 2015-08-06 DIAGNOSIS — I110 Hypertensive heart disease with heart failure: Secondary | ICD-10-CM | POA: Diagnosis not present

## 2015-08-06 DIAGNOSIS — I509 Heart failure, unspecified: Secondary | ICD-10-CM | POA: Diagnosis not present

## 2015-08-06 DIAGNOSIS — I712 Thoracic aortic aneurysm, without rupture: Secondary | ICD-10-CM | POA: Diagnosis not present

## 2015-08-06 DIAGNOSIS — R269 Unspecified abnormalities of gait and mobility: Secondary | ICD-10-CM | POA: Diagnosis not present

## 2015-08-06 DIAGNOSIS — R42 Dizziness and giddiness: Secondary | ICD-10-CM | POA: Diagnosis not present

## 2015-08-06 DIAGNOSIS — I440 Atrioventricular block, first degree: Secondary | ICD-10-CM | POA: Diagnosis not present

## 2015-08-06 DIAGNOSIS — Z794 Long term (current) use of insulin: Secondary | ICD-10-CM | POA: Diagnosis not present

## 2015-08-06 DIAGNOSIS — R531 Weakness: Secondary | ICD-10-CM | POA: Diagnosis not present

## 2015-08-06 DIAGNOSIS — Z952 Presence of prosthetic heart valve: Secondary | ICD-10-CM | POA: Diagnosis not present

## 2015-08-06 DIAGNOSIS — Z7982 Long term (current) use of aspirin: Secondary | ICD-10-CM | POA: Diagnosis not present

## 2015-08-06 DIAGNOSIS — Z79899 Other long term (current) drug therapy: Secondary | ICD-10-CM | POA: Diagnosis not present

## 2015-08-06 DIAGNOSIS — I6529 Occlusion and stenosis of unspecified carotid artery: Secondary | ICD-10-CM | POA: Diagnosis not present

## 2015-08-11 DIAGNOSIS — I728 Aneurysm of other specified arteries: Secondary | ICD-10-CM | POA: Diagnosis not present

## 2015-08-11 DIAGNOSIS — R42 Dizziness and giddiness: Secondary | ICD-10-CM | POA: Diagnosis not present

## 2015-08-11 DIAGNOSIS — Z8673 Personal history of transient ischemic attack (TIA), and cerebral infarction without residual deficits: Secondary | ICD-10-CM | POA: Diagnosis not present

## 2015-08-11 DIAGNOSIS — E119 Type 2 diabetes mellitus without complications: Secondary | ICD-10-CM | POA: Diagnosis not present

## 2015-08-11 DIAGNOSIS — Z794 Long term (current) use of insulin: Secondary | ICD-10-CM | POA: Diagnosis not present

## 2015-08-11 DIAGNOSIS — I779 Disorder of arteries and arterioles, unspecified: Secondary | ICD-10-CM | POA: Diagnosis not present

## 2015-08-11 DIAGNOSIS — I6523 Occlusion and stenosis of bilateral carotid arteries: Secondary | ICD-10-CM | POA: Diagnosis not present

## 2015-08-11 DIAGNOSIS — H532 Diplopia: Secondary | ICD-10-CM | POA: Diagnosis not present

## 2015-08-12 DIAGNOSIS — I639 Cerebral infarction, unspecified: Secondary | ICD-10-CM | POA: Diagnosis not present

## 2015-08-12 DIAGNOSIS — E041 Nontoxic single thyroid nodule: Secondary | ICD-10-CM | POA: Diagnosis not present

## 2015-08-12 DIAGNOSIS — Z6828 Body mass index (BMI) 28.0-28.9, adult: Secondary | ICD-10-CM | POA: Diagnosis not present

## 2015-08-12 DIAGNOSIS — I6502 Occlusion and stenosis of left vertebral artery: Secondary | ICD-10-CM | POA: Diagnosis not present

## 2015-08-12 DIAGNOSIS — I712 Thoracic aortic aneurysm, without rupture: Secondary | ICD-10-CM | POA: Diagnosis not present

## 2015-08-12 DIAGNOSIS — E781 Pure hyperglyceridemia: Secondary | ICD-10-CM | POA: Diagnosis not present

## 2015-08-25 DIAGNOSIS — E041 Nontoxic single thyroid nodule: Secondary | ICD-10-CM | POA: Diagnosis not present

## 2015-08-25 DIAGNOSIS — I639 Cerebral infarction, unspecified: Secondary | ICD-10-CM | POA: Diagnosis not present

## 2015-08-25 DIAGNOSIS — D638 Anemia in other chronic diseases classified elsewhere: Secondary | ICD-10-CM | POA: Diagnosis not present

## 2015-08-25 DIAGNOSIS — E781 Pure hyperglyceridemia: Secondary | ICD-10-CM | POA: Diagnosis not present

## 2015-08-25 DIAGNOSIS — E1129 Type 2 diabetes mellitus with other diabetic kidney complication: Secondary | ICD-10-CM | POA: Diagnosis not present

## 2015-08-25 DIAGNOSIS — R600 Localized edema: Secondary | ICD-10-CM | POA: Diagnosis not present

## 2015-08-25 DIAGNOSIS — E663 Overweight: Secondary | ICD-10-CM | POA: Diagnosis not present

## 2015-08-25 DIAGNOSIS — I10 Essential (primary) hypertension: Secondary | ICD-10-CM | POA: Diagnosis not present

## 2015-08-25 DIAGNOSIS — Z6828 Body mass index (BMI) 28.0-28.9, adult: Secondary | ICD-10-CM | POA: Diagnosis not present

## 2015-08-29 DIAGNOSIS — H9190 Unspecified hearing loss, unspecified ear: Secondary | ICD-10-CM | POA: Diagnosis not present

## 2015-08-29 DIAGNOSIS — R490 Dysphonia: Secondary | ICD-10-CM | POA: Diagnosis not present

## 2015-08-29 DIAGNOSIS — E042 Nontoxic multinodular goiter: Secondary | ICD-10-CM | POA: Diagnosis not present

## 2015-08-29 DIAGNOSIS — J342 Deviated nasal septum: Secondary | ICD-10-CM | POA: Diagnosis not present

## 2015-09-16 DIAGNOSIS — E041 Nontoxic single thyroid nodule: Secondary | ICD-10-CM | POA: Diagnosis not present

## 2015-09-16 DIAGNOSIS — E042 Nontoxic multinodular goiter: Secondary | ICD-10-CM | POA: Diagnosis not present

## 2015-09-29 ENCOUNTER — Other Ambulatory Visit: Payer: Self-pay

## 2015-09-29 NOTE — Patient Outreach (Signed)
Jonathan Ryan) Care Management  09/29/2015  Jonathan Ryan [birth year] [id number]  Telephone call to patient regarding [facility] referral.  Unable to reach patient. HIPAA compliant voice message left with call back phone number.   PLAN; RNCM will attempt 2nd telephone call to patient within 1 week.  Quinn Plowman RN,BSN,CCM [facility] Telephonic  [phone]

## 2015-10-02 ENCOUNTER — Other Ambulatory Visit: Payer: Self-pay

## 2015-10-02 DIAGNOSIS — E0821 Diabetes mellitus due to underlying condition with diabetic nephropathy: Secondary | ICD-10-CM

## 2015-10-02 NOTE — Patient Outreach (Signed)
[facility]) Care Management  10/02/2015  DOIS BILLING 01-06-1929 [id number]   REFERRAL SOURCE:  [facility]  REFERRAL REASON:  Disease and symptoms management  SUBJECTIVE:  Telephone call to patient regarding [facility] referral.  HIPAA verified with patient. Discussed and offered [facility] management services to patient. Patient verbally agreed to receive services.  DIABETES;  Patient states he has been diabetic for approximately 20 years.  Patient states he takes several medications for his diabetes.  Patient states he takes metformin twice a day, glyemipirde twice and day and levemir insulin.  Patient states he is concerned that his blood sugars "go up real fast and come down real fast."  Patient states his blood sugar range has been from 67 to 435.  Patient states this mornings fasting blood sugar was 106.  Patient states yesterday evening blood sugar was 435. Patient states his blood sugar has been low in the morning fasting 3 days this week.  Patient states the blood sugars have been in the 60's.  Patient states he will adjust his levemir if his blood sugar runs to low.  Patient states this week he has adjusted his levemir to 25 units one day and 20 units another. Patient states he has not notified his primary doctor about his blood sugars dropping and has not discussed with his doctor about adjusting his insulin. Patient states his doctor increased his levemir to 30 units from 25 units at his last visit 3-4 weeks ago.  HEART FAILURE;  Patient states he has heart failure. Patient states it has been over a year since he has seen his cardiologist, Dr. Lennox Pippins.  Patient states he will weigh himself some days and some days he just guesses at his weight.   AORTIC ANEURYSM:  Patient states he has 2 aortic aneurysms.  Patient states he is followed by Dr. Vinnie Level.  MEDICATION: Patient states he is able to obtain his medications. Patient states he understands what his medications  are for.  Patient states he is on approximately 12 medications per day.    ASSESSMENT: [facility] referral. Patient will benefit from community case management for diabetic education/ management and heart failure education / management.  PLAN: RNCM will refer patient to community case manager.   Quinn Plowman RN,BSN,CCM [facility] Telephonic  [phone]

## 2015-10-06 ENCOUNTER — Other Ambulatory Visit: Payer: Self-pay

## 2015-10-06 NOTE — Patient Outreach (Signed)
New referral care coordination call:  Placed call to patient and introduced myself. Explained purpose of call. Patient is interested in assistance with DM management.  Offer appointment for tomorrow and patient has accepted. Confirmed address and provided my contact information.  PLAN: Will contact MD office and get last office notes sent to [facility]. Will see patient for an initial home visit on 10/07/2015. Placed call to Dr. Delena Bali office and requested office notes be sent. Spoke with [place].   Tomasa Rand, RN, BSN, CEN [facility] [employer] [phone]

## 2015-10-07 ENCOUNTER — Other Ambulatory Visit: Payer: Self-pay

## 2015-10-07 NOTE — Patient Outreach (Signed)
[place] [facility]) Care Management   10/07/2015  LUCIFER SHIRKEY [birth year] [id number]  BENHAMIN DAMBOISE is an 82 y.o. male 0900 Arrive for home visit.  No family present. Subjective: Patient reports that he is managing the best that he can. Reports that his wife of 29 years died last year and he continues to miss her very much. Reports that he occasionally goes to grief counseling at [facility] home.  Reports that he has children but that do not visit very often. States that he has a girlfriend that helps him and keeps him company.  Patient reports that he enjoys mowing his grass.  Patient reports that he does not follow a diet very much. Reports that he watches his salt.  States that he eats frozen meals and sandwiches.  States that he does not let his girlfriend help him very much because he does not want the community to talk about him.  Patient reports that he has a recent increase in Hgb A1c to 7.7 from 7.3.  States that primary MD wants his Hgb A1c to be less than 7.  Patient reports that he monitors his CBG daily. Reports occasional lows below 70. Reports that he feels shaky when his blood sugar is less than 100.  Patient reports a recent increase in insulin to 30 units at bedtime. Patient reports that he ate pintos for breakfast today.  Patient reports that his digital scale is not working and he needs new batteries. Reports that he will go get some batteries. Patient is currently using an old fashion dial scale.  On log all recent weights are listed at 190 pounds.   Patient reports that he is able to take care of himself at this time without difficulty. Reports that he is able to take a bath, Glennie Rodda his food, pay his bills and go to appointments without difficulty.   Patient chief concern is elevated A1c and being lonely.  Objective:  Awake and alert. Able to answer all orientation questions correctly.  Able to verbalize: year, month, date, president and DOB correctly. Ambulating well in  the home.  Has slightly difficulty getting off sofa.  Medications organized on kitchen counter. Very knowledgeable about his medications.  Filed Vitals:   10/07/15 0930  BP: 124/62  Pulse: 63  Resp: 18  Height: 1.778 m (5\' 10" )  Weight: 190 lb (86.183 kg)  SpO2: 97%   Review of Systems  Constitutional: Negative.   HENT: Negative.        Reports recent thyroid biopsy.  Eyes: Negative.   Respiratory: Negative.  Negative for shortness of breath.   Cardiovascular: Negative.  Negative for leg swelling.  Gastrointestinal: Negative.        Reports colostomy to the left lower abdomen.  Genitourinary: Positive for frequency.       Reports urinary frequency when taking lasix  Musculoskeletal: Positive for joint pain.       Reports joint pain when changing positions or standing for a long time  Skin: Negative.   Neurological: Negative.        Reports no deficits from previous stroke  Endo/Heme/Allergies: Negative.   Psychiatric/Behavioral: Negative.     Physical Exam  Constitutional: He is oriented to person, place, and time. He appears well-developed and well-nourished.  Cardiovascular: Normal rate.   Respiratory: Effort normal and breath sounds normal.  Lungs clear  GI: Soft. Bowel sounds are normal.  Musculoskeletal: Normal range of motion. He exhibits no edema.  Neurological: He is  alert and oriented to person, place, and time.  Skin: Skin is warm and dry.  Skin without lesions. Feet without sores or ulcers.  Psychiatric: He has a normal mood and affect.  Crying when talking about missing his wife.    Encounter Medications:   Outpatient Encounter Prescriptions as of 10/07/2015  Medication Sig Note  . Ascorbic Acid (VITAMIN C) 1000 MG tablet Take 1,000 mg by mouth daily. 10/07/2015: Takes 500 mg once a day  . aspirin 325 MG tablet Take 325 mg by mouth daily.   Marland Kitchen atorvastatin (LIPITOR) 10 MG tablet Take 10 mg by mouth daily.   . furosemide (LASIX) 20 MG tablet Take 20 mg by  mouth daily. 10/07/2015: Takes 40 mg once a day  . glimepiride (AMARYL) 1 MG tablet Take 2 mg by mouth 2 (two) times daily.    . insulin detemir (LEVEMIR) 100 UNIT/ML injection Inject 30 Units into the skin at bedtime.    . metFORMIN (GLUCOPHAGE) 500 MG tablet Take 1,000 mg by mouth 2 (two) times daily.   . metoprolol tartrate (LOPRESSOR) 25 MG tablet Take 12.5 mg by mouth 2 (two) times daily.   . Multiple Vitamin (MULTI-VITAMINS) TABS Take 2 tablets by mouth daily.   Marland Kitchen zinc gluconate 50 MG tablet Take 50 mg by mouth daily.   Marland Kitchen aspirin EC 81 MG tablet Take 81 mg by mouth daily. Reported on 10/07/2015   . finasteride (PROPECIA) 1 MG tablet Take 1 mg by mouth daily. Reported on 10/07/2015   . promethazine-dextromethorphan (PROMETHAZINE-DM) 6.25-15 MG/5ML syrup Take 5 mLs by mouth 4 (four) times daily as needed for cough. (Patient not taking: Reported on 10/07/2015)    No facility-administered encounter medications on file as of 10/07/2015.    Functional Status:   In your present state of health, do you have any difficulty performing the following activities: 10/07/2015 06/10/2015  Hearing? Y N  Vision? Y N  Difficulty concentrating or making decisions? N N  Walking or climbing stairs? N Y  Dressing or bathing? N N  Doing errands, shopping? N N  Preparing Food and eating ? N -  Using the Toilet? N -  In the past six months, have you accidently leaked urine? N -  Do you have problems with loss of bowel control? N -  Managing your Medications? N -  Managing your Finances? N -  Housekeeping or managing your Housekeeping? N -    Fall/Depression Screening:    PHQ 2/9 Scores 10/07/2015 09/04/2014  PHQ - 2 Score 0 0  PHQ- 9 Score - 1   Fall Risk  10/07/2015 09/04/2014  Falls in the past year? Yes No  Number falls in past yr: 1 -  Injury with Fall? No -  Risk for fall due to : History of fall(s) -  Follow up Falls prevention discussed -    Assessment:   (1) reviewed [facility] program. Provided new  patient packet, [facility] calendar, magnet and my contact card.  Consent reviewed by patient and all questions answered. (2) DM: recent increased in A1c.  Keeping a CBG log. Recent increase in Levemir. Not following DM diet. (3) CHF: digital scale not working. Keeping a weight log.  Following low salt diet. (4) no exercise plan at this time (5) Feels lonely and misses his wife. (6) takes medication as prescribed.   Plan:  (1) consent scanned into chart. Copy left with patient in his home. (2) Reviewed low carb diet, provided Emmi education on DM. Reviewed  importance of eating on a regular basis. Discussed with patient if he begins to follow diet that he needed to discuss insulin needs with MD. (3) Low salt diet tear off poster provided. Reviewed heart failure zones and when to call MD.  Encouraged patient to get new batteries. (4) reviewed benefits of exercise.  Encourage patient to increase activity. (5) review local resources at the senior center and grief counseling at [facility].  Will reassess at next home visit. Encouraged patient to reach out to family members and friends. (6) encouraged patient to continue to take all medications as prescribed. Encourage patient to notify [facility] if he needs any assistance with medications.   [facility] CM Care Plan Problem One        Most Recent Value   Care Plan Problem One  Elevated Hgb A1c    Role Documenting the Problem One  Care Management [place] for Problem One  Active   [facility] Long Term Goal (31-90 days)  Patient will report decrease in Hgb A1c in the next 90 days   [facility] Long Term Goal Start Date  10/07/15   Interventions for Problem One Long Term Goal  Reviewed importance of having  A1c monitored every 3 months.  reviewed current hgb A1c results.  Reviewed foods to avoid. Reviewed serving portions.   [facility] CM Short Term Goal #1 (0-30 days)  Patient will record CBG readings in [facility] calendar daily for the next 30 days   [facility] CM Short Term Goal  #1 Start Date  10/07/15   Interventions for Short Term Goal #1  Provided [facility] calendar,  Discussed patients target of fasting CBG of 100 per day.  Reviewed emmi education that was provided during home visit. Reviewed [facility] low carb poster. Provided samples of male DM diet.    [facility] CM Short Term Goal #2 (0-30 days)  Patient will read EMMI education provided at home visit before next home visit or 30 days.   [facility] CM Short Term Goal #2 Start Date  10/07/15   Interventions for Short Term Goal #2  Reviewed importance of educational material.   [facility] CM Short Term Goal #3 (0-30 days)  Patient will verbalize increase in exercise for the next 30 days.   [facility] CM Short Term Goal #3 Start Date  10/07/15   Interventions for Short Tern Goal #3  Reviewed options of exercise like walking or using the treadmill. discussed beneifts of increased energy levels and low CBG readings when exercising.    [facility] CM Care Plan Problem Two        Most Recent Value   Care Plan Problem Two  History of CHF with no digitial scale   Role Documenting the Problem Two  Care Management Coordinator   Care Plan for Problem Two  Active   [facility] CM Short Term Goal #1 (0-30 days)  Patient will get batteries for digital scale before next home visit in 30 days.   [facility] CM Short Term Goal #1 Start Date  10/07/15   Interventions for Short Term Goal #2   review importance of accurate weights.  Encouraged patient to call MD for weight gain. reviewed heart failure zones.   [facility] CM Short Term Goal #2 (0-30 days)  Patient will verbalize following his low salt diet for the next 30 days.   [facility] CM Short Term Goal #2 Start Date  10/07/15   Interventions for Short Term Goal #2  Povided [facility] low salt diet tear off page.  Provided EMMI education on low salt diet.     Care planning and goal setting during home visit and primary goal is to decrease A1c. Next home visit planned for July 25th.  Wrote appointment on card for patient. Encouraged patient to call me if needed  before next home visit.  Will send this note to MD.  Tomasa Rand, RN, BSN, CEN [place] Coordinator [phone]

## 2015-10-08 DIAGNOSIS — S61217A Laceration without foreign body of left little finger without damage to nail, initial encounter: Secondary | ICD-10-CM | POA: Diagnosis not present

## 2015-10-13 DIAGNOSIS — R55 Syncope and collapse: Secondary | ICD-10-CM | POA: Diagnosis not present

## 2015-10-13 DIAGNOSIS — E1129 Type 2 diabetes mellitus with other diabetic kidney complication: Secondary | ICD-10-CM | POA: Diagnosis not present

## 2015-10-13 DIAGNOSIS — I2510 Atherosclerotic heart disease of native coronary artery without angina pectoris: Secondary | ICD-10-CM | POA: Diagnosis not present

## 2015-10-13 DIAGNOSIS — E041 Nontoxic single thyroid nodule: Secondary | ICD-10-CM | POA: Diagnosis not present

## 2015-10-13 DIAGNOSIS — I6502 Occlusion and stenosis of left vertebral artery: Secondary | ICD-10-CM | POA: Diagnosis not present

## 2015-10-13 DIAGNOSIS — Z6829 Body mass index (BMI) 29.0-29.9, adult: Secondary | ICD-10-CM | POA: Diagnosis not present

## 2015-10-16 ENCOUNTER — Ambulatory Visit: Payer: PPO | Admitting: Cardiology

## 2015-10-17 ENCOUNTER — Encounter: Payer: Self-pay | Admitting: Cardiology

## 2015-10-17 ENCOUNTER — Ambulatory Visit (INDEPENDENT_AMBULATORY_CARE_PROVIDER_SITE_OTHER): Payer: PPO | Admitting: Cardiology

## 2015-10-17 ENCOUNTER — Encounter (INDEPENDENT_AMBULATORY_CARE_PROVIDER_SITE_OTHER): Payer: Self-pay

## 2015-10-17 VITALS — BP 122/80 | HR 63 | Ht 70.0 in | Wt 191.0 lb

## 2015-10-17 DIAGNOSIS — R55 Syncope and collapse: Secondary | ICD-10-CM

## 2015-10-17 NOTE — Progress Notes (Signed)
Electrophysiology Office Note   Date:  10/17/2015   ID:  Jonathan Ryan, DOB 03-13-1929, MRN [id number]  PCP:  Jonathan Dress, MD  Cardiologist:  Jonathan Ryan Primary Electrophysiologist:  Jonathan Lukasiewicz Meredith Leeds, MD    Chief Complaint  Patient presents with  . New Patient (Initial Visit)  . Loss of Consciousness     History of Present Illness: Jonathan Ryan is a 82 y.o. male who presents today for electrophysiology evaluation.   History of CAD, HTN, chronic diastolic CHF, Type 2 DM, Aortic stenosis (s/p AVR in 2013), thoracic aortic ascending aneurysm, and descending thoracic aortic aneurysm. History of mild CAD with cardiac catheterization in 2013 showing 20% stenosis in the LAD and proximal and 30% stenosis in the Mid-LCx.   He also had a recent echocardiogram on 06/08/2015 while at [facility] which showed an EF of > 55%. AV Mean gradient was 12. AV peak velocity 2.2. Degenerative mitral valve disease was noted along with moderate TR.  He presents for workup of presyncope. He reports two main episodes of blurry vision which have occurred while driving. He denies any associated lightheadedness, dizziness, diaphoresis, nausea, vomiting, chest pain, or palpitations. The symptoms last for less than 30 seconds. He does report feeling lightheaded at times when going from lying down to standing up, but when this occurs there are no vision changes.   He wore a 30 day monitor which did not show signs of arrhythmia.   He had an episode of near syncope last Friday where he was at [facility]. He says that he was walking around the store and stopped look at a refrigerator and fell forward. He says that he remembers the entire event. He says that he felt weak, but was not dizzy and had no palpitations or chest pain. After the event, he felt well and continued to walk around the store. He says that he was there with his family who had recently eaten a meal, but he did not eat at that time. He says that usually when he  becomes hypoglycemic, he gets shaky, which did not happen this time.  Today, he denies symptoms of palpitations, chest pain, shortness of breath, orthopnea, PND, lower extremity edema, claudication, dizziness, bleeding, or neurologic sequela. The patient is tolerating medications without difficulties and is otherwise without complaint today.    Past Medical History  Diagnosis Date  . Coronary artery disease   . Hypertension   . CHF (congestive heart failure) ([place])   . Diabetes mellitus without complication ([place])   . Diverticular disease of left colon   . Thoracic ascending aortic aneurysm ([place])   . Descending thoracic aortic aneurysm ([place])   . Arthritis   . Blood transfusion without reported diagnosis   . Cataract   . Emphysema of lung ([place])     as a child  . GERD (gastroesophageal reflux disease)   . Stroke ([place])     08/01/2015  . Thyroid disease     reports nodules   Past Surgical History  Procedure Laterality Date  . Aortic valve repair    . Colon surgery    . Small intestine surgery    . Hernia repair       Current Outpatient Prescriptions  Medication Sig Dispense Refill  . Ascorbic Acid (VITAMIN C) 1000 MG tablet Take 1,000 mg by mouth daily.    Marland Kitchen aspirin 325 MG tablet Take 325 mg by mouth daily.    Marland Kitchen aspirin EC 81 MG tablet Take 81 mg by  mouth daily. Reported on 10/07/2015    . atorvastatin (LIPITOR) 10 MG tablet Take 10 mg by mouth daily.    . furosemide (LASIX) 20 MG tablet Take 20 mg by mouth daily.    Marland Kitchen glimepiride (AMARYL) 1 MG tablet Take 2 mg by mouth 2 (two) times daily.     . insulin detemir (LEVEMIR) 100 UNIT/ML injection Inject 30 Units into the skin at bedtime.     . metFORMIN (GLUCOPHAGE) 500 MG tablet Take 1,000 mg by mouth 2 (two) times daily.    . metoprolol tartrate (LOPRESSOR) 25 MG tablet Take 12.5 mg by mouth 2 (two) times daily.    . Multiple Vitamin (MULTI-VITAMINS) TABS Take 2 tablets by mouth daily.    Marland Kitchen zinc gluconate 50 MG tablet Take 50 mg  by mouth daily.     No current facility-administered medications for this visit.    Allergies:   Penicillins; Vancomycin; and Ramipril   Social History:  The patient  reports that he has quit smoking. His smoking use included Cigarettes. He does not have any smokeless tobacco history on file. He reports that he does not drink alcohol or use illicit drugs.   Family History:  The patient's family history includes Heart attack in his father.    ROS:  Please see the history of present illness.   Otherwise, review of systems is positive for syncope.   All other systems are reviewed and negative.    PHYSICAL EXAM: VS:  BP 122/80 mmHg  Pulse 63  Ht 5\' 10"  (1.778 m)  Wt 191 lb (86.637 kg)  BMI 27.41 kg/m2 , BMI Body mass index is 27.41 kg/(m^2). GEN: Well nourished, well developed, in no acute distress HEENT: normal Neck: no JVD, carotid bruits, or masses Cardiac: RRR; no murmurs, rubs, or gallops,no edema  Respiratory:  clear to auscultation bilaterally, normal work of breathing GI: soft, nontender, nondistended, + BS MS: no deformity or atrophy Skin: warm and dry Neuro:  Strength and sensation are intact Psych: euthymic mood, full affect  EKG:  EKG is ordered today. The ekg ordered today shows sinus rhythm, LAFB, 1 degree AV block, PR 210, rate 63  Recent Labs: 06/10/2015: BUN 12; Creatinine, Ser 0.91; Hemoglobin 14.5; Platelets 161; Potassium 4.2; Sodium 141 06/11/2015: TSH 2.996    Lipid Panel  No results found for: CHOL, TRIG, HDL, CHOLHDL, VLDL, LDLCALC, LDLDIRECT   Wt Readings from Last 3 Encounters:  10/17/15 191 lb (86.637 kg)  10/07/15 190 lb (86.183 kg)  06/11/15 182 lb 11.2 oz (82.872 kg)      Other studies Reviewed: Additional studies/ records that were reviewed today include:  30 day monitor 3*6/17 Review of the above records today demonstrates:  Normal sinus rhythm  No arrhythmia  Artifact noted throughout with priods of lead disconnection Unremarkable  study    ASSESSMENT AND PLAN:  1.  Presyncope: At this time is unclear the cause of his near syncope. He had a normal cardiac monitor and his echocardiogram per report from last April is without major abnormality. His EF is 60-65% and his aortic valve replacement does not show any evidence of stenosis or regurgitation. It is likely that his near yncope is multifactorial.  2.  Hypertension: Well controlled today   Current medicines are reviewed at length with the patient today.   The patient does not have concerns regarding his medicines.  The following changes were made today:  none  Labs/ tests ordered today include:  No orders of the  defined types were placed in this encounter.     Disposition:   FU with Estill Llerena PRN  Signed, Carlina Derks Meredith Leeds, MD  10/17/2015 10:05 AM     [address] [place] [address] [phone] (office) [phone] (fax)

## 2015-10-17 NOTE — Patient Instructions (Addendum)
Medication Instructions:  Your physician recommends that you continue on your current medications as directed. Please refer to the Current Medication list given to you today.  Labwork: None ordered  Testing/Procedures: None ordered  Follow-Up: Call office if/when you would like a monitor placed.  Any Other Special Instructions Will Be Listed Below (If Applicable).  If you need a refill on your cardiac medications before your next appointment, please call your pharmacy.  Thank you for choosing [facility]!!   Trinidad Curet, RN [phone]

## 2015-10-27 ENCOUNTER — Ambulatory Visit: Payer: PPO | Admitting: Internal Medicine

## 2015-11-04 ENCOUNTER — Other Ambulatory Visit: Payer: Self-pay

## 2015-11-04 NOTE — Patient Outreach (Signed)
[place] [facility]) Care Management  11/04/2015  Jonathan Ryan 1929-02-05 [id number]   0900 Arrived for scheduled home visit. Patient not at home. Placed call to patient. No answer.  Left my card in the door and requested a call back.  Tomasa Rand, RN, BSN, CEN [facility] [employer] [phone]

## 2015-11-06 ENCOUNTER — Other Ambulatory Visit: Payer: Self-pay

## 2015-11-06 NOTE — Patient Outreach (Signed)
Care coordination: Patient called back after missing home visit. Apologized. Offered to reschedule home visit and patient has accepted.   PLAN: Home visit on July 31st at [place], RN, BSN, [facility] [employer] [phone]

## 2015-11-10 ENCOUNTER — Other Ambulatory Visit: Payer: Self-pay

## 2015-11-10 NOTE — Patient Outreach (Signed)
[facility]) Care Management   11/10/2015  Jonathan Ryan 15-Jul-1928 [id number]  Jonathan Ryan is an 82 y.o. male Arrived for home visit: Patient home alone. Subjective: Patient reports that he is doing okay. Reports that he is sorry he missed his appointment last week. Reports that he has started a walking program 4 days ago. Reports that he goes to the mall and walks at 8am.  Reports that he goes with a friend. States that he has been monitoring his CBG daily. Reports 1 low readings of 66 on 10/31/2015.  States that he has been self adjusting his insulin between 20-30 units daily based on his CBG readings.  Patient unable to provide me with a scale.  States that he just decides on each day.  Reports that he is not following his DM diet.  Reports that he know that he should. Admits to eating ice cream.  Patient reports that he is weighing daily and states that he never went to get a battery for his digital scale. Reports that he will.  Reports no problems with medications at this time. States that he had an episode where he passed out while at [place] that he did not eat but states that he did not have any signs of low blood sugar. Reports that he drank a drink and ate something at a restaurant and then felt better.  Reports that he followed up with cardiology and states MD did not think it was related to his heart.  Denies any other episodes.  Objective:  Awake and alert. Ambulating well with any assistive devices. Vitals:   11/10/15 1415  BP: 110/62  Pulse: (!) 58  Resp: 18  Weight: 195 lb (88.5 kg)   Review of Systems  Constitutional: Negative.   HENT: Negative.   Eyes: Negative.   Respiratory: Negative.   Cardiovascular: Positive for leg swelling.  Gastrointestinal: Negative.   Genitourinary: Negative.   Musculoskeletal: Negative.   Skin: Negative.   Neurological: Negative.   Endo/Heme/Allergies: Negative.   Psychiatric/Behavioral: Negative.     Physical  Exam  Constitutional: He is oriented to person, place, and time. He appears well-developed and well-nourished.  Cardiovascular:  Bradycardic. On metoprolol. This is patients normal heart rate per his home log.  Respiratory: Effort normal and breath sounds normal.  GI: Soft. Bowel sounds are normal.  Musculoskeletal: Normal range of motion. He exhibits edema.  1 plus edema to both lower legs  Neurological: He is alert and oriented to person, place, and time.  Skin: Skin is warm and dry.  Psychiatric: He has a normal mood and affect. His behavior is normal. Judgment and thought content normal.    Encounter Medications:   Outpatient Encounter Prescriptions as of 11/10/2015  Medication Sig  . Ascorbic Acid (VITAMIN C) 1000 MG tablet Take 1,000 mg by mouth daily.  Marland Kitchen aspirin 325 MG tablet Take 325 mg by mouth daily.  Marland Kitchen atorvastatin (LIPITOR) 10 MG tablet Take 10 mg by mouth daily.  . furosemide (LASIX) 20 MG tablet Take 20 mg by mouth daily.  Marland Kitchen glimepiride (AMARYL) 1 MG tablet Take 2 mg by mouth 2 (two) times daily.   . insulin detemir (LEVEMIR) 100 UNIT/ML injection Inject 30 Units into the skin at bedtime.   . metFORMIN (GLUCOPHAGE) 500 MG tablet Take 1,000 mg by mouth 2 (two) times daily.  . metoprolol tartrate (LOPRESSOR) 25 MG tablet Take 12.5 mg by mouth 2 (two) times daily.  . Multiple Vitamin (MULTI-VITAMINS)  TABS Take 2 tablets by mouth daily.  Marland Kitchen zinc gluconate 50 MG tablet Take 50 mg by mouth daily.  Marland Kitchen aspirin EC 81 MG tablet Take 81 mg by mouth daily. Reported on 10/07/2015   No facility-administered encounter medications on file as of 11/10/2015.     Functional Status:   In your present state of health, do you have any difficulty performing the following activities: 10/07/2015 06/10/2015  Hearing? Y N  Vision? Y N  Difficulty concentrating or making decisions? N N  Walking or climbing stairs? N Y  Dressing or bathing? N N  Doing errands, shopping? N N  Preparing Food and  eating ? N -  Using the Toilet? N -  In the past six months, have you accidently leaked urine? N -  Do you have problems with loss of bowel control? N -  Managing your Medications? N -  Managing your Finances? N -  Housekeeping or managing your Housekeeping? N -  Some recent data might be hidden    Fall/Depression Screening:    PHQ 2/9 Scores 10/07/2015 09/04/2014  PHQ - 2 Score 0 0  PHQ- 9 Score - 1    Assessment:  (1) Not taking insulin as prescribed.  (2) Keeping good CBG and weight logs. (3) started daily exercise program. (4) not following DM diet. (5) Changed to DASH salt substitute.  Plan:  (1) encouraged patient to take all meds as prescribed. Review 7, 14, 30 days averages with patient. (2)encouraged patient to continue to keep good logs. (3) Encouraged patient to continue daily exercise with a friend. (4) Encouraged patient to avoid sweets.  He is not sure that he is interested in following diet. Reports that he has reviewed EMMI education and DM packet. (5) Offered support for patient to continue to use salt substitute.  THN CM Care Plan Problem One   Flowsheet Row Most Recent Value  Care Plan Problem One  Elevated Hgb A1c   Role Documenting the Problem One  Care Management Coordinator  Care Plan for Problem One  Active  THN Long Term Goal (31-90 days)  Patient will report decrease in Hgb A1c in the next 90 days  THN Long Term Goal Start Date  10/07/15  Interventions for Problem One Long Term Goal  Encouraged patient to follow DM diet and take medications as prescribed.   THN CM Short Term Goal #1 (0-30 days)  Patient will record CBG readings in [facility] calendar daily for the next 30 days  THN CM Short Term Goal #1 Start Date  10/07/15  [facility] CM Short Term Goal #1 Met Date  11/10/15  Interventions for Short Term Goal #1  Provided [facility] calendar,  Discussed patients target of fasting CBG of 100 per day.  Reviewed emmi education that was provided during home visit. Reviewed THN  low carb poster. Provided samples of male DM diet.   THN CM Short Term Goal #2 (0-30 days)  Patient will read EMMI education provided at home visit before next home visit or 30 days.  THN CM Short Term Goal #2 Start Date  10/07/15  [facility] CM Short Term Goal #2 Met Date  11/10/15  Interventions for Short Term Goal #2  Reviewed importance of educational material.  THN CM Short Term Goal #3 (0-30 days)  Patient will verbalize increase in exercise for the next 30 days.  THN CM Short Term Goal #3 Start Date  11/10/15  Interventions for Short Tern Goal #3  Reviewed options of exercise  like walking or using the treadmill. discussed beneifts of increased energy levels and low CBG readings when exercising.    THN CM Care Plan Problem Two   Flowsheet Row Most Recent Value  Care Plan Problem Two  History of CHF with no digitial scale  Role Documenting the Problem Two  Care Management Coordinator  Care Plan for Problem Two  Active  THN CM Short Term Goal #1 (0-30 days)  Patient will get batteries for digital scale before next home visit in 30 days.  THN CM Short Term Goal #1 Start Date  10/07/15  [facility] CM Short Term Goal #1 Met Date   11/10/15  Interventions for Short Term Goal #2   review importance of accurate weights.  Encouraged patient to call MD for weight gain. reviewed heart failure zones.  THN CM Short Term Goal #2 (0-30 days)  Patient will verbalize following his low salt diet for the next 30 days.  THN CM Short Term Goal #2 Start Date  10/07/15  [facility] CM Short Term Goal #2 Met Date  11/10/15  Interventions for Short Term Goal #2  Povided THN low salt diet tear off page. Provided EMMI education on low salt diet.      Next home visit planned with patient on August 31st. Card provided with date and time. Patient wrote this on his personal calendar. Reviewed goals with patient before end of home visit. Offered motivation.  Tomasa Rand, RN, BSN, CEN [facility] [employer] [phone]

## 2015-12-03 DIAGNOSIS — Z23 Encounter for immunization: Secondary | ICD-10-CM | POA: Diagnosis not present

## 2015-12-03 DIAGNOSIS — I10 Essential (primary) hypertension: Secondary | ICD-10-CM | POA: Diagnosis not present

## 2015-12-03 DIAGNOSIS — I639 Cerebral infarction, unspecified: Secondary | ICD-10-CM | POA: Diagnosis not present

## 2015-12-03 DIAGNOSIS — E785 Hyperlipidemia, unspecified: Secondary | ICD-10-CM | POA: Diagnosis not present

## 2015-12-03 DIAGNOSIS — D638 Anemia in other chronic diseases classified elsewhere: Secondary | ICD-10-CM | POA: Diagnosis not present

## 2015-12-03 DIAGNOSIS — Z125 Encounter for screening for malignant neoplasm of prostate: Secondary | ICD-10-CM | POA: Diagnosis not present

## 2015-12-03 DIAGNOSIS — E1129 Type 2 diabetes mellitus with other diabetic kidney complication: Secondary | ICD-10-CM | POA: Diagnosis not present

## 2015-12-11 ENCOUNTER — Other Ambulatory Visit: Payer: Self-pay

## 2015-12-11 NOTE — Patient Outreach (Signed)
[facility]) Care Management   12/11/2015  Jonathan Ryan 11/23/1928 [id number]  Jonathan Ryan is an 82 y.o. male 69 Arrived for home visit.  Subjective:  Patient reports that he is doing well. Reports that he saw primary MD last week for office visit. States HgbA1c is elevated and that his insulin was increased to 35 units.  Reports that his CBG was low this am at 66. Reports that he felt shaky and eat something. Reports feels well now. Patient states that he eats twice a day. Usually breakfast and then late lunch/ early dinner at 3-4pm daily, then eats a bedtime snack. Reports that he ate 3 bowls of soup yesterday and a piece of bread at bedtime.  CBG meter reports averages of 178 for 7 days, 179 for 14 days and 180 for 30 days. Reports walking at the mall daily for about an hour. Patient denies taking his lipitor. States that he is out.  Patient admits that he got a new battery for his scale and feels his weights are more accurate.  Objective:   Vitals:   12/11/15 1113  BP: (!) 112/58  Pulse: 69  Resp: 20  SpO2: 98%  Weight: 197 lb (89.4 kg)   Review of Systems  Constitutional: Negative.   HENT: Negative.   Eyes: Negative.   Respiratory: Negative.   Cardiovascular: Negative.   Gastrointestinal: Negative.   Genitourinary: Negative.   Musculoskeletal: Positive for joint pain.       Reports painful hips when walking  Skin: Negative.   Neurological: Negative.   Endo/Heme/Allergies: Negative.   Psychiatric/Behavioral: Negative.     Physical Exam  Constitutional: He is oriented to person, place, and time. He appears well-developed and well-nourished.  Cardiovascular: Normal rate.   Respiratory: Effort normal.  Lungs clear  GI: Soft. Bowel sounds are normal.  Musculoskeletal: Normal range of motion. He exhibits no edema.  Neurological: He is alert and oriented to person, place, and time.  Skin: Skin is warm and dry.  Feet without lesions or open skin   Psychiatric: He has a normal mood and affect.  Tearful when talking about wife.    Encounter Medications:   Outpatient Encounter Prescriptions as of 12/11/2015  Medication Sig  . aspirin 325 MG tablet Take 325 mg by mouth daily.  . furosemide (LASIX) 20 MG tablet Take 20 mg by mouth daily.  Marland Kitchen glimepiride (AMARYL) 1 MG tablet Take 2 mg by mouth 2 (two) times daily.   . insulin detemir (LEVEMIR) 100 UNIT/ML injection Inject 35 Units into the skin at bedtime.   . metFORMIN (GLUCOPHAGE) 500 MG tablet Take 1,000 mg by mouth 2 (two) times daily.  . metoprolol tartrate (LOPRESSOR) 25 MG tablet Take 12.5 mg by mouth 2 (two) times daily.  . Multiple Vitamin (MULTI-VITAMINS) TABS Take 2 tablets by mouth daily.  . Ascorbic Acid (VITAMIN C) 1000 MG tablet Take 1,000 mg by mouth daily.  Marland Kitchen aspirin EC 81 MG tablet Take 81 mg by mouth daily. Reported on 10/07/2015  . atorvastatin (LIPITOR) 10 MG tablet Take 10 mg by mouth daily.  Marland Kitchen zinc gluconate 50 MG tablet Take 50 mg by mouth daily.   No facility-administered encounter medications on file as of 12/11/2015.     Functional Status:   In your present state of health, do you have any difficulty performing the following activities: 10/07/2015 06/10/2015  Hearing? Y N  Vision? Y N  Difficulty concentrating or making decisions? N N  Walking  or climbing stairs? N Y  Dressing or bathing? N N  Doing errands, shopping? N N  Preparing Food and eating ? N -  Using the Toilet? N -  In the past six months, have you accidently leaked urine? N -  Do you have problems with loss of bowel control? N -  Managing your Medications? N -  Managing your Finances? N -  Housekeeping or managing your Housekeeping? N -  Some recent data might be hidden    Fall/Depression Screening:    PHQ 2/9 Scores 10/07/2015 09/04/2014  PHQ - 2 Score 0 0  PHQ- 9 Score - 1    Assessment:   (1) Increased in Hgb a1c.  Patient admits that he self adjust his insulin based on his CBG  readings. (2) currently with daily exercise routine. (3) not taking his lipitor. (4) not following DM diet.  Plan:  (1) placed call to MD office and spoke with Nonie Hoyer and confirms A1c increased to 8.1.  Also confirms Levemir was increased to 35 units daily. Encouraged patient to take medications as prescribed and call MD with low CBG readings. Reviewed treatment for low blood sugars. (2) encouraged patient to continue exercise daily. (3) Spoke with Nonie Hoyer at Dr. Delena Bali office who confirmed that RX called to pharmacy for Lipitor 10 mg. Encouraged patient to take medications. (4)Reviewed importance of eating an evening snack with protein. Reviewed importance of eating regular meals. Patient is able to review sample diets that were provided at last home visit.  Next home visit planned for September 21st.  Wrote this on patients CBG log.  This note sent to MD.  [facility] CM Care Plan Problem One   Flowsheet Row Most Recent Value  Care Plan Problem One  Elevated Hgb A1c   Role Documenting the Problem One  Care Management [place] for Problem One  Active  THN Long Term Goal (31-90 days)  Patient will report decrease in Hgb A1c in the next 90 days  THN Long Term Goal Start Date  10/07/15  Interventions for Problem One Long Term Goal  A1c increased. Reviewed importance of patient taking medications as prescibed.  THN CM Short Term Goal #1 (0-30 days)  Patient will record CBG readings in [facility] calendar daily for the next 30 days  THN CM Short Term Goal #1 Start Date  10/07/15  [facility] CM Short Term Goal #1 Met Date  11/10/15  Interventions for Short Term Goal #1  Provided [facility] calendar,  Discussed patients target of fasting CBG of 100 per day.  Reviewed emmi education that was provided during home visit. Reviewed THN low carb poster. Provided samples of male DM diet.   THN CM Short Term Goal #2 (0-30 days)  Patient will read EMMI education provided at home visit before next home visit  or 30 days.  THN CM Short Term Goal #2 Start Date  10/07/15  [facility] CM Short Term Goal #2 Met Date  11/10/15  Interventions for Short Term Goal #2  Reviewed importance of educational material.  THN CM Short Term Goal #3 (0-30 days)  Patient will verbalize increase in exercise for the next 30 days.  THN CM Short Term Goal #3 Start Date  11/10/15  [facility] CM Short Term Goal #3 Met Date  12/04/15  Interventions for Short Tern Goal #3  Reviewed options of exercise like walking or using the treadmill. discussed beneifts of increased energy levels and low CBG readings when exercising.  THN  CM Short Term Goal #4 (0-30 days)  Patient will verbalize eating a bedtime snack with protein for the next 30 days  THN CM Short Term Goal #4 Start Date  12/11/15  Interventions for Short Term Goal #4  Reviewed protein snack options. Explained importance of eating protein to stablize CBG during the nightime hours.  THN CM Short Term Goal #5 (0-30 days)  Patient will verbalize calling MD for low CBG levels in the next 21 days.  THN CM Short Term Goal #5 Start Date  12/11/15  Interventions for Short Term Goal #5  Reviewed importance of notifying MD for low CBG levels . Reviewed signs and symptoms of hypoglycemia and how to treat.    THN CM Care Plan Problem Two   Flowsheet Row Most Recent Value  Care Plan Problem Two  History of CHF with no digitial scale  Role Documenting the Problem Two  Care Management Coordinator  Care Plan for Problem Two  Active  THN CM Short Term Goal #1 (0-30 days)  Patient will get batteries for digital scale before next home visit in 30 days.  THN CM Short Term Goal #1 Start Date  10/07/15  [facility] CM Short Term Goal #1 Met Date   11/10/15  Interventions for Short Term Goal #2   review importance of accurate weights.  Encouraged patient to call MD for weight gain. reviewed heart failure zones.  THN CM Short Term Goal #2 (0-30 days)  Patient will verbalize following his low salt diet for the next 30  days.  THN CM Short Term Goal #2 Start Date  10/07/15  [facility] CM Short Term Goal #2 Met Date  11/10/15  Interventions for Short Term Goal #2  Povided THN low salt diet tear off page. Provided EMMI education on low salt diet.      Tomasa Rand, RN, BSN, CEN [facility] [employer] [phone]

## 2016-01-01 ENCOUNTER — Other Ambulatory Visit: Payer: Self-pay

## 2016-01-01 NOTE — Patient Outreach (Signed)
Jonathan Ryan) Care Management   01/01/2016  Jonathan Ryan 1929-03-07 [id number]  Jonathan Ryan is an 82 y.o. male 10:10 am Arrived for home visit.  Subjective: Patient reports that he is doing well. Denies any recent low blood sugar readings. States that he has been eating a bedtime snack every night and taking his levemir 35 units daily. Reports walking about 1.5 miles per day at the mall 6 days a week.  Denies any new problems or concerns.  Keeping CBG log.  Non fasting CBG today after breakfast of 166. CBG range before breakfast   100-130. Bedtime CBG range of 200's.  Reports that he continues to eat 2 meals per day.   Objective:  Awake and alert. Ambulating well. Appears to be in good spirits today. Vitals:   01/01/16 1024  BP: 108/62  Pulse: 70  Resp: 16  SpO2: 95%  Weight: 194 lb 9.6 oz (88.3 kg)   Review of Systems  Constitutional: Negative.   HENT: Negative.   Eyes: Negative.   Respiratory: Negative.   Cardiovascular: Negative.   Gastrointestinal: Negative.        Reports colostomy is doing well.  Genitourinary: Negative.   Musculoskeletal: Negative.        Denies any recent falls  Skin: Negative.   Neurological: Negative.   Endo/Heme/Allergies: Negative.   Psychiatric/Behavioral: Negative.     Physical Exam  Constitutional: He is oriented to person, place, and time. He appears well-developed and well-nourished.  Cardiovascular: Normal rate.   Respiratory: Effort normal and breath sounds normal.  Lungs clear.  GI: Soft. Bowel sounds are normal.  Musculoskeletal: Normal range of motion. He exhibits edema.  Left lower leg edema 1 plus. Trace edema to the right leg.  Ambulating well  Neurological: He is alert and oriented to person, place, and time.  Skin: Skin is warm and dry.  Feet without lesions or broken skin. Long toenails noted.  Psychiatric: He has a normal mood and affect. His behavior is normal. Judgment and thought content normal.     Encounter Medications:   Outpatient Encounter Prescriptions as of 01/01/2016  Medication Sig  . Ascorbic Acid (VITAMIN C) 1000 MG tablet Take 1,000 mg by mouth daily.  Marland Kitchen aspirin 325 MG tablet Take 325 mg by mouth daily.  Marland Kitchen atorvastatin (LIPITOR) 10 MG tablet Take 10 mg by mouth daily.  . furosemide (LASIX) 20 MG tablet Take 20 mg by mouth daily.  Marland Kitchen glimepiride (AMARYL) 1 MG tablet Take 2 mg by mouth 2 (two) times daily.   . insulin detemir (LEVEMIR) 100 UNIT/ML injection Inject 35 Units into the skin at bedtime.   . metFORMIN (GLUCOPHAGE) 500 MG tablet Take 1,000 mg by mouth 2 (two) times daily.  . metoprolol tartrate (LOPRESSOR) 25 MG tablet Take 12.5 mg by mouth 2 (two) times daily.  . Multiple Vitamin (MULTI-VITAMINS) TABS Take 2 tablets by mouth daily.  Marland Kitchen zinc gluconate 50 MG tablet Take 50 mg by mouth daily.  Marland Kitchen aspirin EC 81 MG tablet Take 81 mg by mouth daily. Reported on 10/07/2015   No facility-administered encounter medications on file as of 01/01/2016.     Functional Status:   In your present state of health, do you have any difficulty performing the following activities: 10/07/2015 06/10/2015  Hearing? Y N  Vision? Y N  Difficulty concentrating or making decisions? N N  Walking or climbing stairs? N Y  Dressing or bathing? N N  Doing errands, shopping? N N  Preparing Food and eating ? N -  Using the Toilet? N -  In the past six months, have you accidently leaked urine? N -  Do you have problems with loss of bowel control? N -  Managing your Medications? N -  Managing your Finances? N -  Housekeeping or managing your Housekeeping? N -  Some recent data might be hidden    Fall/Depression Screening:    PHQ 2/9 Scores 10/07/2015 09/04/2014  PHQ - 2 Score 0 0  PHQ- 9 Score - 1    Assessment:   (1) CBG readings decrease. Appears to be in better control. Reports taking insulin as prescribed and eating a bedtime snack.  No hypoglycemic episodes. (2) Continues to exercise  daily. (3) Pending MD follow on 03/08/2016 for MD visit and labs. Called primary MD office and confirmed time and date.   Plan:  (1) Encouraged patient to continue to take medication as prescribed. Encouraged patient to report any hypoglycemic episodes to MD.  Reminded patient to continue to eat a bedtime snack. (2) encouraged patient to continue to exercise daily. (3) encouraged patient to keep MD appointment.  Reviewed care planning and goal setting. Patient no longer needs home visits. He feels like he knows what to do to manage his DM. Offered [facility] health coach and patient has accepted. Will plan to close patient to community case management at this time.   Will place order for health coach. Will send update to MD.  [facility] CM Care Plan Problem One   Flowsheet Row Most Recent Value  Care Plan Problem One  Elevated Hgb A1c   Role Documenting the Problem One  Care Management [place] for Problem One  Active  THN Long Term Goal (31-90 days)  Patient will report decrease in Hgb A1c in the next 90 days  THN Long Term Goal Start Date  01/01/16 Jonathan Ryan restarted. Next appointment 03/08/2016]  Interventions for Problem One Long Term Goal  Reviewed importance of taking all medication   THN CM Short Term Goal #1 (0-30 days)  Patient will record CBG readings in [place] [facility] calendar daily for the next 30 days  THN CM Short Term Goal #1 Start Date  10/07/15  [facility] CM Short Term Goal #1 Met Date  11/10/15  Interventions for Short Term Goal #1  Provided [facility] calendar,  Discussed patients target of fasting CBG of 100 per day.  Reviewed emmi education that was provided during home visit. Reviewed THN low carb poster. Provided samples of male DM diet.   THN CM Short Term Goal #2 (0-30 days)  Patient will read EMMI education provided at home visit before next home visit or 30 days.  THN CM Short Term Goal #2 Start Date  10/07/15  [facility] CM Short Term Goal #2 Met Date  11/10/15  Interventions for Short Term  Goal #2  Reviewed importance of educational material.  THN CM Short Term Goal #3 (0-30 days)  Patient will verbalize increase in exercise for the next 30 days.  THN CM Short Term Goal #3 Start Date  11/10/15  [facility] CM Short Term Goal #3 Met Date  12/04/15  Interventions for Short Tern Goal #3  Reviewed options of exercise like walking or using the treadmill. discussed beneifts of increased energy levels and low CBG readings when exercising.  THN CM Short Term Goal #4 (0-30 days)  Patient will verbalize eating a bedtime snack with protein for the next 30 days  THN CM Short Term Goal #  4 Start Date  12/11/15  [facility] CM Short Term Goal #4 Met Date  01/01/16  Interventions for Short Term Goal #4  Reviewed protein snack options. Explained importance of eating protein to stablize CBG during the nightime hours.  THN CM Short Term Goal #5 (0-30 days)  Patient will verbalize calling MD for low CBG levels in the next 21 days.  THN CM Short Term Goal #5 Start Date  12/11/15  [facility] CM Short Term Goal #5 Met Date  01/01/16  Interventions for Short Term Goal #5  Reviewed importance of notifying MD for low CBG levels . Reviewed signs and symptoms of hypoglycemia and how to treat.    THN CM Care Plan Problem Two   Flowsheet Row Most Recent Value  Care Plan Problem Two  History of CHF with no digitial scale  Role Documenting the Problem Two  Care Management Coordinator  Care Plan for Problem Two  Active  THN CM Short Term Goal #1 (0-30 days)  Patient will get batteries for digital scale before next home visit in 30 days.  THN CM Short Term Goal #1 Start Date  10/07/15  [facility] CM Short Term Goal #1 Met Date   11/10/15  Interventions for Short Term Goal #2   review importance of accurate weights.  Encouraged patient to call MD for weight gain. reviewed heart failure zones.  THN CM Short Term Goal #2 (0-30 days)  Patient will verbalize following his low salt diet for the next 30 days.  THN CM Short Term Goal #2 Start Date   10/07/15  [facility] CM Short Term Goal #2 Met Date  11/10/15  Interventions for Short Term Goal #2  Povided THN low salt diet tear off page. Provided EMMI education on low salt diet.      Tomasa Rand, RN, BSN, CEN [facility] [employer] [phone]

## 2016-01-07 ENCOUNTER — Other Ambulatory Visit: Payer: Self-pay

## 2016-01-07 DIAGNOSIS — C61 Malignant neoplasm of prostate: Secondary | ICD-10-CM | POA: Diagnosis not present

## 2016-01-07 NOTE — Patient Outreach (Signed)
[facility]) Care Management  01/07/2016  Jonathan Ryan 1928-08-15 [id number]   Telephone call to patient for introductory call. Patient reports that he is doing ok. He states he has a urology appointment this afternoon as his PSA was elevated. Patient reports having prostate cancer and was "Sewn up back there".  Patient states that he has a colostomy because there was nothing else they could do. He states he is not sure how much help his appointment with the urologist will be but he is going.  Encouraged patient to go to the appointment and see how it goes.  He verbalized understanding.  Explained to patient health coach and role.  Patient receptive to health coach role.    Plan: RN Health Coach will contact patient in the month of October and patient agrees to next outreach.  Jone Baseman, RN, MSN [place] [phone]

## 2016-01-22 DIAGNOSIS — E118 Type 2 diabetes mellitus with unspecified complications: Secondary | ICD-10-CM | POA: Diagnosis not present

## 2016-01-22 DIAGNOSIS — K8020 Calculus of gallbladder without cholecystitis without obstruction: Secondary | ICD-10-CM | POA: Diagnosis not present

## 2016-01-22 DIAGNOSIS — E781 Pure hyperglyceridemia: Secondary | ICD-10-CM | POA: Diagnosis not present

## 2016-01-22 DIAGNOSIS — R55 Syncope and collapse: Secondary | ICD-10-CM | POA: Diagnosis not present

## 2016-01-22 DIAGNOSIS — K4020 Bilateral inguinal hernia, without obstruction or gangrene, not specified as recurrent: Secondary | ICD-10-CM | POA: Diagnosis not present

## 2016-01-22 DIAGNOSIS — I2510 Atherosclerotic heart disease of native coronary artery without angina pectoris: Secondary | ICD-10-CM | POA: Diagnosis not present

## 2016-01-22 DIAGNOSIS — I712 Thoracic aortic aneurysm, without rupture: Secondary | ICD-10-CM | POA: Diagnosis not present

## 2016-01-22 DIAGNOSIS — Z953 Presence of xenogenic heart valve: Secondary | ICD-10-CM | POA: Diagnosis not present

## 2016-01-22 DIAGNOSIS — H532 Diplopia: Secondary | ICD-10-CM | POA: Diagnosis not present

## 2016-02-04 ENCOUNTER — Other Ambulatory Visit: Payer: Self-pay

## 2016-02-04 NOTE — Patient Outreach (Signed)
[facility]) Care Management  [place]  02/04/2016   Jonathan Ryan 1929-03-25 [id number]  Subjective: Telephone call to patient for initial assessment.  Patient reports he is ok. Patient reports with him seeing the urologist he was put on finasteride and he reports a follow up appointment soon.  He states his sugars are up and down.  Last night sugar was in the 300's but this morning it was 59. He states he has snack. Discussed with patient blood sugars and trying to eat on a schedule to control sugars. He verbalized understanding and states that he does a bad job with that and that he will try.  Also discussed diet and types of thing to eat and what to avoid. He verbalized understanding  Objective:   Encounter Medications:  Outpatient Encounter Prescriptions as of 02/04/2016  Medication Sig  . Ascorbic Acid (VITAMIN C) 1000 MG tablet Take 1,000 mg by mouth daily.  Marland Kitchen aspirin 325 MG tablet Take 325 mg by mouth daily.  Marland Kitchen atorvastatin (LIPITOR) 10 MG tablet Take 10 mg by mouth daily.  . finasteride (PROSCAR) 5 MG tablet Take 5 mg by mouth daily.  . furosemide (LASIX) 20 MG tablet Take 20 mg by mouth daily.  Marland Kitchen glimepiride (AMARYL) 1 MG tablet Take 2 mg by mouth 2 (two) times daily.   . insulin detemir (LEVEMIR) 100 UNIT/ML injection Inject 35 Units into the skin at bedtime.   . metFORMIN (GLUCOPHAGE) 500 MG tablet Take 1,000 mg by mouth 2 (two) times daily.  . metoprolol tartrate (LOPRESSOR) 25 MG tablet Take 12.5 mg by mouth 2 (two) times daily.  . Multiple Vitamin (MULTI-VITAMINS) TABS Take 2 tablets by mouth daily.  Marland Kitchen zinc gluconate 50 MG tablet Take 50 mg by mouth daily.  Marland Kitchen aspirin EC 81 MG tablet Take 81 mg by mouth daily. Reported on 10/07/2015   No facility-administered encounter medications on file as of 02/04/2016.     Functional Status:  In your present state of health, do you have any difficulty performing the following activities: 02/04/2016  10/07/2015  Hearing? Tempie Donning  Vision? N Y  Difficulty concentrating or making decisions? N N  Walking or climbing stairs? N N  Dressing or bathing? N N  Doing errands, shopping? N N  Preparing Food and eating ? N N  Using the Toilet? N N  In the past six months, have you accidently leaked urine? N N  Do you have problems with loss of bowel control? N N  Managing your Medications? N N  Managing your Finances? N N  Housekeeping or managing your Housekeeping? N N  Some recent data might be hidden    Fall/Depression Screening: PHQ 2/9 Scores 02/04/2016 10/07/2015 09/04/2014  PHQ - 2 Score 0 0 0  PHQ- 9 Score - - 1    Assessment: Patient will benefit from health coach outreach for education and support for disease management of diabetes.  Plan:  [facility] CM Care Plan Problem One   Flowsheet Row Most Recent Value  Care Plan Problem One  Elevated Hgb A1c   Role Documenting the Problem One  [place] for Problem One  Active  THN Long Term Goal (31-90 days)  Patient will report decrease in Hgb A1c in the next 90 days  THN Long Term Goal Start Date  02/04/16  Interventions for Problem One [place] discussed with patient importance of eating on a regular schedule, taking  medication as prescribed, limiting carbohydrates and sweets.      RN Health Coach will provide ongoing education for patient on diabetes through phone calls and sending printed information to patient for further discussion.  RN Health Coach sent welcome letter.   RN Health Coach will send initial barriers letter, assessment, and care plan to primary care physician.  RN Health Coach will contact patient in the month of November and patient agrees to next contact.   Jone Baseman, RN, MSN [place] [phone]

## 2016-03-01 ENCOUNTER — Other Ambulatory Visit: Payer: Self-pay

## 2016-03-01 DIAGNOSIS — E118 Type 2 diabetes mellitus with unspecified complications: Secondary | ICD-10-CM | POA: Diagnosis not present

## 2016-03-01 DIAGNOSIS — Z953 Presence of xenogenic heart valve: Secondary | ICD-10-CM | POA: Diagnosis not present

## 2016-03-01 DIAGNOSIS — E781 Pure hyperglyceridemia: Secondary | ICD-10-CM | POA: Diagnosis not present

## 2016-03-01 DIAGNOSIS — I712 Thoracic aortic aneurysm, without rupture: Secondary | ICD-10-CM | POA: Diagnosis not present

## 2016-03-01 DIAGNOSIS — I2510 Atherosclerotic heart disease of native coronary artery without angina pectoris: Secondary | ICD-10-CM | POA: Diagnosis not present

## 2016-03-01 NOTE — Patient Outreach (Signed)
[facility]) Care Management  [place]  03/01/2016   Jonathan Ryan 18-Mar-1929 [id number]  Subjective: Telephone call to patient for monthly call.  Patient reports he is doing ok.  Patient reports having an appointment with vascular surgeon in [facility] today to check his aneurysms to his heart.  Patient reports last check they were the same. Patient reports that his sugars are some better not so up and down. He reports his sugar this morning was 215 but he states he did not have enough levemir last night.  Patient states he will get more today.  Discussed with patient importance of maintaining medications. He verbalized understanding. Also discussed with patient diabetic diet and blood sugar control.  He verbalized understanding.   Objective:   Encounter Medications:  Outpatient Encounter Prescriptions as of 03/01/2016  Medication Sig  . Ascorbic Acid (VITAMIN C) 1000 MG tablet Take 1,000 mg by mouth daily.  Marland Kitchen aspirin 325 MG tablet Take 325 mg by mouth daily.  Marland Kitchen aspirin EC 81 MG tablet Take 81 mg by mouth daily. Reported on 10/07/2015  . atorvastatin (LIPITOR) 10 MG tablet Take 10 mg by mouth daily.  . finasteride (PROSCAR) 5 MG tablet Take 5 mg by mouth daily.  . furosemide (LASIX) 20 MG tablet Take 20 mg by mouth daily.  Marland Kitchen glimepiride (AMARYL) 1 MG tablet Take 2 mg by mouth 2 (two) times daily.   . insulin detemir (LEVEMIR) 100 UNIT/ML injection Inject 35 Units into the skin at bedtime.   . metFORMIN (GLUCOPHAGE) 500 MG tablet Take 1,000 mg by mouth 2 (two) times daily.  . metoprolol tartrate (LOPRESSOR) 25 MG tablet Take 12.5 mg by mouth 2 (two) times daily.  . Multiple Vitamin (MULTI-VITAMINS) TABS Take 2 tablets by mouth daily.  Marland Kitchen zinc gluconate 50 MG tablet Take 50 mg by mouth daily.   No facility-administered encounter medications on file as of 03/01/2016.     Functional Status:  In your present state of health, do you have any difficulty  performing the following activities: 02/04/2016 10/07/2015  Hearing? Tempie Donning  Vision? N Y  Difficulty concentrating or making decisions? N N  Walking or climbing stairs? N N  Dressing or bathing? N N  Doing errands, shopping? N N  Preparing Food and eating ? N N  Using the Toilet? N N  In the past six months, have you accidently leaked urine? N N  Do you have problems with loss of bowel control? N N  Managing your Medications? N N  Managing your Finances? N N  Housekeeping or managing your Housekeeping? N N  Some recent data might be hidden    Fall/Depression Screening: PHQ 2/9 Scores 03/01/2016 02/04/2016 10/07/2015 09/04/2014  PHQ - 2 Score 0 0 0 0  PHQ- 9 Score - - - 1    Assessment: Patient continues to benefit from health coach outreach for disease management and support.    Plan:  [facility] CM Care Plan Problem One   Flowsheet Row Most Recent Value  Care Plan Problem One  Elevated Hgb A1c   Role Documenting the Problem One  [place] for Problem One  Active  THN Long Term Goal (31-90 days)  Patient will report decrease in Hgb A1c in the next 90 days  THN Long Term Goal Start Date  03/01/16 [goal continued]  Interventions for Problem One [place] reviewed with patient importance of eating on a regular  schedule, taking medication as prescribed, limiting carbohydrates and sweets.      RN Health Coach will contact patient in the month of December and patient agrees to next outreach.  Jone Baseman, RN, MSN [place] [phone]

## 2016-03-08 DIAGNOSIS — Z2821 Immunization not carried out because of patient refusal: Secondary | ICD-10-CM | POA: Diagnosis not present

## 2016-03-08 DIAGNOSIS — R600 Localized edema: Secondary | ICD-10-CM | POA: Diagnosis not present

## 2016-03-08 DIAGNOSIS — E1129 Type 2 diabetes mellitus with other diabetic kidney complication: Secondary | ICD-10-CM | POA: Diagnosis not present

## 2016-03-08 DIAGNOSIS — E785 Hyperlipidemia, unspecified: Secondary | ICD-10-CM | POA: Diagnosis not present

## 2016-03-08 DIAGNOSIS — E663 Overweight: Secondary | ICD-10-CM | POA: Diagnosis not present

## 2016-03-08 DIAGNOSIS — D638 Anemia in other chronic diseases classified elsewhere: Secondary | ICD-10-CM | POA: Diagnosis not present

## 2016-03-08 DIAGNOSIS — I639 Cerebral infarction, unspecified: Secondary | ICD-10-CM | POA: Diagnosis not present

## 2016-03-08 DIAGNOSIS — C61 Malignant neoplasm of prostate: Secondary | ICD-10-CM | POA: Diagnosis not present

## 2016-03-08 DIAGNOSIS — I10 Essential (primary) hypertension: Secondary | ICD-10-CM | POA: Diagnosis not present

## 2016-03-17 DIAGNOSIS — E11649 Type 2 diabetes mellitus with hypoglycemia without coma: Secondary | ICD-10-CM | POA: Diagnosis not present

## 2016-03-24 ENCOUNTER — Other Ambulatory Visit: Payer: Self-pay

## 2016-03-24 DIAGNOSIS — E113293 Type 2 diabetes mellitus with mild nonproliferative diabetic retinopathy without macular edema, bilateral: Secondary | ICD-10-CM | POA: Diagnosis not present

## 2016-03-24 NOTE — Patient Outreach (Signed)
[facility]) Care Management  [place]  03/24/2016   Jonathan Ryan 1928-09-04 [id number]  Subjective: Telephone call to patient for monthly call.  Patient reports that his sugar this morning was 76 and he had not had any in a good while.  He does report that his insulin was increased due to A1c being elevated some. He states he knows it was 8 something.  Discussed with patient diet, limiting carbohydrates, and sweets.  He verbalized understanding.   Objective:   Encounter Medications:  Outpatient Encounter Prescriptions as of 03/24/2016  Medication Sig Note  . Ascorbic Acid (VITAMIN C) 1000 MG tablet Take 1,000 mg by mouth daily.   Marland Kitchen aspirin 325 MG tablet Take 325 mg by mouth daily.   Marland Kitchen atorvastatin (LIPITOR) 10 MG tablet Take 10 mg by mouth daily.   . finasteride (PROSCAR) 5 MG tablet Take 5 mg by mouth daily.   . furosemide (LASIX) 20 MG tablet Take 20 mg by mouth daily.   Marland Kitchen glimepiride (AMARYL) 1 MG tablet Take 2 mg by mouth 2 (two) times daily.    . insulin detemir (LEVEMIR) 100 UNIT/ML injection Inject 35 Units into the skin at bedtime.  03/24/2016: Patient taking 40 units at night  . metFORMIN (GLUCOPHAGE) 500 MG tablet Take 1,000 mg by mouth 2 (two) times daily.   . metoprolol tartrate (LOPRESSOR) 25 MG tablet Take 12.5 mg by mouth 2 (two) times daily.   . Multiple Vitamin (MULTI-VITAMINS) TABS Take 2 tablets by mouth daily.   Marland Kitchen zinc gluconate 50 MG tablet Take 50 mg by mouth daily.   Marland Kitchen aspirin EC 81 MG tablet Take 81 mg by mouth daily. Reported on 10/07/2015    No facility-administered encounter medications on file as of 03/24/2016.     Functional Status:  In your present state of health, do you have any difficulty performing the following activities: 02/04/2016 10/07/2015  Hearing? Tempie Donning  Vision? N Y  Difficulty concentrating or making decisions? N N  Walking or climbing stairs? N N  Dressing or bathing? N N  Doing errands, shopping? N N   Preparing Food and eating ? N N  Using the Toilet? N N  In the past six months, have you accidently leaked urine? N N  Do you have problems with loss of bowel control? N N  Managing your Medications? N N  Managing your Finances? N N  Housekeeping or managing your Housekeeping? N N  Some recent data might be hidden    Fall/Depression Screening: PHQ 2/9 Scores 03/24/2016 03/01/2016 02/04/2016 10/07/2015 09/04/2014  PHQ - 2 Score 0 0 0 0 0  PHQ- 9 Score - - - - 1    Assessment: Patient continues to benefit from health coach outreach for disease management and support.    Plan:  [facility] CM Care Plan Problem One   Flowsheet Row Most Recent Value  Care Plan Problem One  Elevated Hgb A1c   Role Documenting the Problem One  [place] for Problem One  Active  THN Long Term Goal (31-90 days)  Patient will report decrease in Hgb A1c in the next 90 days  THN Long Term Goal Start Date  03/24/16 [goal continued]  Interventions for Problem One Long Term Goal  RN Health Coach discussed with patient importance of eating on a regular schedule, taking medication as prescribed, limiting carbohydrates and sweets.      RN Health Coach will contact patient in the month  of January and patient agrees to next outreach.  Jone Baseman, RN, MSN [place] [phone]

## 2016-04-23 ENCOUNTER — Other Ambulatory Visit: Payer: Self-pay

## 2016-04-23 NOTE — Patient Outreach (Signed)
[facility]) Care Management  [place]  04/23/2016   Jonathan Ryan 1928/06/18 [id number]  Subjective: Telephone call to patient for monthly call.  He reports that he is doing ok for an 83 year old.  Patient reports some pain at times in his right hip.  He states if pain continues he will see his primary doctor.  Patient reports that his blood sugar has been in the 200's and last check was 240. Discussed with patient importance of diet and activity in lowering blood sugar. He verbalized understanding.    Objective:   Encounter Medications:  Outpatient Encounter Prescriptions as of 04/23/2016  Medication Sig Note  . Ascorbic Acid (VITAMIN C) 1000 MG tablet Take 1,000 mg by mouth daily.   Marland Kitchen aspirin 325 MG tablet Take 325 mg by mouth daily.   Marland Kitchen aspirin EC 81 MG tablet Take 81 mg by mouth daily. Reported on 10/07/2015   . atorvastatin (LIPITOR) 10 MG tablet Take 10 mg by mouth daily.   . finasteride (PROSCAR) 5 MG tablet Take 5 mg by mouth daily.   . furosemide (LASIX) 20 MG tablet Take 20 mg by mouth daily.   Marland Kitchen glimepiride (AMARYL) 1 MG tablet Take 2 mg by mouth 2 (two) times daily.    . insulin detemir (LEVEMIR) 100 UNIT/ML injection Inject 35 Units into the skin at bedtime.  03/24/2016: Patient taking 40 units at night  . metFORMIN (GLUCOPHAGE) 500 MG tablet Take 1,000 mg by mouth 2 (two) times daily.   . metoprolol tartrate (LOPRESSOR) 25 MG tablet Take 12.5 mg by mouth 2 (two) times daily.   . Multiple Vitamin (MULTI-VITAMINS) TABS Take 2 tablets by mouth daily.   Marland Kitchen zinc gluconate 50 MG tablet Take 50 mg by mouth daily.    No facility-administered encounter medications on file as of 04/23/2016.     Functional Status:  In your present state of health, do you have any difficulty performing the following activities: 02/04/2016 10/07/2015  Hearing? Tempie Donning  Vision? N Y  Difficulty concentrating or making decisions? N N  Walking or climbing stairs? N N  Dressing or  bathing? N N  Doing errands, shopping? N N  Preparing Food and eating ? N N  Using the Toilet? N N  In the past six months, have you accidently leaked urine? N N  Do you have problems with loss of bowel control? N N  Managing your Medications? N N  Managing your Finances? N N  Housekeeping or managing your Housekeeping? N N  Some recent data might be hidden    Fall/Depression Screening: PHQ 2/9 Scores 04/23/2016 03/24/2016 03/01/2016 02/04/2016 10/07/2015 09/04/2014  PHQ - 2 Score 0 0 0 0 0 0  PHQ- 9 Score - - - - - 1    Assessment: Patient continues to benefit from health coach outreach for disease management and support.    Plan:  [facility] CM Care Plan Problem One   Flowsheet Row Most Recent Value  Care Plan Problem One  Elevated Hgb A1c   Role Documenting the Problem One  [place] for Problem One  Active  THN Long Term Goal (31-90 days)  Patient will report decrease in Hgb A1c in the next 90 days  THN Long Term Goal Start Date  04/23/16 [goal continued]  Interventions for Problem One [place] reviewed with patient importance of eating on a regular schedule, taking medication as prescribed, limiting carbohydrates  and sweets.      RN Health Coach will contact patient in the month of February and patient agrees to next outreach.  Jone Baseman, RN, MSN [place] [phone]

## 2016-04-27 DIAGNOSIS — R600 Localized edema: Secondary | ICD-10-CM | POA: Diagnosis not present

## 2016-04-27 DIAGNOSIS — I10 Essential (primary) hypertension: Secondary | ICD-10-CM | POA: Diagnosis not present

## 2016-04-27 DIAGNOSIS — Z6829 Body mass index (BMI) 29.0-29.9, adult: Secondary | ICD-10-CM | POA: Diagnosis not present

## 2016-05-07 DIAGNOSIS — M25551 Pain in right hip: Secondary | ICD-10-CM | POA: Diagnosis not present

## 2016-05-07 DIAGNOSIS — Z6829 Body mass index (BMI) 29.0-29.9, adult: Secondary | ICD-10-CM | POA: Diagnosis not present

## 2016-05-10 DIAGNOSIS — M1611 Unilateral primary osteoarthritis, right hip: Secondary | ICD-10-CM | POA: Diagnosis not present

## 2016-05-12 DIAGNOSIS — M545 Low back pain: Secondary | ICD-10-CM | POA: Diagnosis not present

## 2016-05-12 DIAGNOSIS — M25551 Pain in right hip: Secondary | ICD-10-CM | POA: Diagnosis not present

## 2016-05-19 DIAGNOSIS — M25551 Pain in right hip: Secondary | ICD-10-CM | POA: Diagnosis not present

## 2016-05-26 ENCOUNTER — Other Ambulatory Visit: Payer: Self-pay

## 2016-05-26 DIAGNOSIS — Z6829 Body mass index (BMI) 29.0-29.9, adult: Secondary | ICD-10-CM | POA: Diagnosis not present

## 2016-05-26 DIAGNOSIS — Z20828 Contact with and (suspected) exposure to other viral communicable diseases: Secondary | ICD-10-CM | POA: Diagnosis not present

## 2016-05-26 DIAGNOSIS — R6889 Other general symptoms and signs: Secondary | ICD-10-CM | POA: Diagnosis not present

## 2016-05-26 DIAGNOSIS — Z933 Colostomy status: Secondary | ICD-10-CM | POA: Diagnosis not present

## 2016-05-26 NOTE — Patient Outreach (Signed)
[facility]) Care Management  [place]  05/26/2016   Jonathan Ryan 06-21-1928 [id number]  Subjective: Telephone call to patient for monthly call.  Patient reports some right hip pain.  He states he had an injection to his hip last week. He reports that pain right now is minimal but not sure if shot has helped.  Explained to patient that sometimes it takes multiple injections to take affect.  He verbalized understanding.  He also reports with the injection his sugar went up but now is coming down with this mornings sugar being 149.  Discussed with patient how steroid shots increase sugars and encouraged him to maintain his diet.  He verbalized understanding.    Objective:   Encounter Medications:  Outpatient Encounter Prescriptions as of 05/26/2016  Medication Sig Note  . Ascorbic Acid (VITAMIN C) 1000 MG tablet Take 1,000 mg by mouth daily.   Marland Kitchen aspirin 325 MG tablet Take 325 mg by mouth daily.   Marland Kitchen atorvastatin (LIPITOR) 10 MG tablet Take 10 mg by mouth daily.   . finasteride (PROSCAR) 5 MG tablet Take 5 mg by mouth daily.   . furosemide (LASIX) 20 MG tablet Take 20 mg by mouth daily.   Marland Kitchen glimepiride (AMARYL) 1 MG tablet Take 2 mg by mouth 2 (two) times daily.    . insulin detemir (LEVEMIR) 100 UNIT/ML injection Inject 35 Units into the skin at bedtime.  03/24/2016: Patient taking 40 units at night  . metFORMIN (GLUCOPHAGE) 500 MG tablet Take 1,000 mg by mouth 2 (two) times daily.   . metoprolol tartrate (LOPRESSOR) 25 MG tablet Take 12.5 mg by mouth 2 (two) times daily.   . Multiple Vitamin (MULTI-VITAMINS) TABS Take 2 tablets by mouth daily.   Marland Kitchen zinc gluconate 50 MG tablet Take 50 mg by mouth daily.   Marland Kitchen aspirin EC 81 MG tablet Take 81 mg by mouth daily. Reported on 10/07/2015    No facility-administered encounter medications on file as of 05/26/2016.     Functional Status:  In your present state of health, do you have any difficulty performing the following  activities: 02/04/2016 10/07/2015  Hearing? Tempie Donning  Vision? N Y  Difficulty concentrating or making decisions? N N  Walking or climbing stairs? N N  Dressing or bathing? N N  Doing errands, shopping? N N  Preparing Food and eating ? N N  Using the Toilet? N N  In the past six months, have you accidently leaked urine? N N  Do you have problems with loss of bowel control? N N  Managing your Medications? N N  Managing your Finances? N N  Housekeeping or managing your Housekeeping? N N  Some recent data might be hidden    Fall/Depression Screening: PHQ 2/9 Scores 05/26/2016 04/23/2016 03/24/2016 03/01/2016 02/04/2016 10/07/2015 09/04/2014  PHQ - 2 Score 0 0 0 0 0 0 0  PHQ- 9 Score - - - - - - 1    Assessment: Patient continues to benefit from health coach outreach for disease management and support.    Plan:  [facility] CM Care Plan Problem One   Flowsheet Row Most Recent Value  Care Plan Problem One  Elevated Hgb A1c   Role Documenting the Problem One  [place] for Problem One  Active  [facility]N Long Term Goal (31-90 days)  Patient will report a decrease in Hgb A1c by 0.2 points in the next 90 days  [facility]N Long Term Goal Start Date  05/26/16 [goal continued]  Interventions for Problem One [place] reviewed with patient importance of eating on a regular schedule, taking medication as prescribed, limiting carbohydrates and sweets.      RN Health Coach will contact patient in the month of March and patient agrees to next outreach.  Jone Baseman, RN, MSN [place] [phone]

## 2016-05-27 DIAGNOSIS — J111 Influenza due to unidentified influenza virus with other respiratory manifestations: Secondary | ICD-10-CM | POA: Diagnosis not present

## 2016-06-11 DIAGNOSIS — N3289 Other specified disorders of bladder: Secondary | ICD-10-CM | POA: Diagnosis not present

## 2016-06-11 DIAGNOSIS — C61 Malignant neoplasm of prostate: Secondary | ICD-10-CM | POA: Diagnosis not present

## 2016-06-14 DIAGNOSIS — E785 Hyperlipidemia, unspecified: Secondary | ICD-10-CM | POA: Diagnosis not present

## 2016-06-14 DIAGNOSIS — R600 Localized edema: Secondary | ICD-10-CM | POA: Diagnosis not present

## 2016-06-14 DIAGNOSIS — I10 Essential (primary) hypertension: Secondary | ICD-10-CM | POA: Diagnosis not present

## 2016-06-14 DIAGNOSIS — Z6829 Body mass index (BMI) 29.0-29.9, adult: Secondary | ICD-10-CM | POA: Diagnosis not present

## 2016-06-14 DIAGNOSIS — I639 Cerebral infarction, unspecified: Secondary | ICD-10-CM | POA: Diagnosis not present

## 2016-06-14 DIAGNOSIS — D638 Anemia in other chronic diseases classified elsewhere: Secondary | ICD-10-CM | POA: Diagnosis not present

## 2016-06-14 DIAGNOSIS — E1129 Type 2 diabetes mellitus with other diabetic kidney complication: Secondary | ICD-10-CM | POA: Diagnosis not present

## 2016-06-14 DIAGNOSIS — Z79890 Hormone replacement therapy (postmenopausal): Secondary | ICD-10-CM | POA: Diagnosis not present

## 2016-06-23 ENCOUNTER — Other Ambulatory Visit: Payer: Self-pay

## 2016-06-23 DIAGNOSIS — M545 Low back pain: Secondary | ICD-10-CM | POA: Diagnosis not present

## 2016-06-23 NOTE — Patient Outreach (Signed)
[facility]) Care Management  06/23/2016  Jonathan Ryan 05/02/1928 [id number]   Telephone call to patient for monthly call.  No answer. HIPAA compliant voice message left.    Plan: RN Health Coach will attempt patient again in the month of March.  Jone Baseman, RN, MSN [place] [phone]

## 2016-07-01 DIAGNOSIS — M545 Low back pain: Secondary | ICD-10-CM | POA: Diagnosis not present

## 2016-07-05 ENCOUNTER — Other Ambulatory Visit: Payer: Self-pay

## 2016-07-05 DIAGNOSIS — M5416 Radiculopathy, lumbar region: Secondary | ICD-10-CM | POA: Diagnosis not present

## 2016-07-05 DIAGNOSIS — M545 Low back pain: Secondary | ICD-10-CM | POA: Diagnosis not present

## 2016-07-05 NOTE — Patient Outreach (Signed)
[facility]) Care Management  07/05/2016  Jonathan Ryan [birth year] [id number]   2nd telephone call to patient for monthly call. No answer.  HIPAA  Compliant voice message left.   Plan: RN Health Coach will attempt patient in the month of April.  Jone Baseman, RN, MSN [place] [phone]

## 2016-07-12 DIAGNOSIS — M5416 Radiculopathy, lumbar region: Secondary | ICD-10-CM | POA: Diagnosis not present

## 2016-07-12 DIAGNOSIS — M545 Low back pain: Secondary | ICD-10-CM | POA: Diagnosis not present

## 2016-07-16 ENCOUNTER — Other Ambulatory Visit: Payer: Self-pay

## 2016-07-16 NOTE — Patient Outreach (Signed)
[facility]) Care Management  07/16/2016  DRYDEN TAPLEY 1928/12/24 [id number]   3rd telephone call to patient for monthly call.  No answer.  HIPAA compliant voice message left.  Plan: RN Health Coach will send letter to attempt patient. If no response within 10 business days will proceed with case closure.  Jone Baseman, RN, MSN [place] [phone]

## 2016-07-22 DIAGNOSIS — K5730 Diverticulosis of large intestine without perforation or abscess without bleeding: Secondary | ICD-10-CM | POA: Diagnosis not present

## 2016-07-22 DIAGNOSIS — I2510 Atherosclerotic heart disease of native coronary artery without angina pectoris: Secondary | ICD-10-CM | POA: Diagnosis not present

## 2016-07-22 DIAGNOSIS — Z01810 Encounter for preprocedural cardiovascular examination: Secondary | ICD-10-CM | POA: Diagnosis not present

## 2016-07-22 DIAGNOSIS — E785 Hyperlipidemia, unspecified: Secondary | ICD-10-CM | POA: Diagnosis not present

## 2016-07-22 DIAGNOSIS — E119 Type 2 diabetes mellitus without complications: Secondary | ICD-10-CM | POA: Diagnosis not present

## 2016-07-22 DIAGNOSIS — I716 Thoracoabdominal aortic aneurysm, without rupture: Secondary | ICD-10-CM | POA: Diagnosis not present

## 2016-07-22 DIAGNOSIS — Z953 Presence of xenogenic heart valve: Secondary | ICD-10-CM | POA: Diagnosis not present

## 2016-07-22 DIAGNOSIS — I10 Essential (primary) hypertension: Secondary | ICD-10-CM | POA: Diagnosis not present

## 2016-07-22 DIAGNOSIS — I712 Thoracic aortic aneurysm, without rupture: Secondary | ICD-10-CM | POA: Diagnosis not present

## 2016-07-22 DIAGNOSIS — K8020 Calculus of gallbladder without cholecystitis without obstruction: Secondary | ICD-10-CM | POA: Diagnosis not present

## 2016-07-30 ENCOUNTER — Other Ambulatory Visit: Payer: Self-pay

## 2016-07-30 NOTE — Patient Outreach (Signed)
[facility]) Care Management  07/30/2016  KYRESE GARTMAN Oct 17, 1928 [id number]   Patient has not responded to calls or letter.     Plan: RN Health Coach will proceed with case closure. RN Health Coach will send physician and patient case closure letter.  RN Health Coach will notify care management assistant of case closure.    Jone Baseman, RN, MSN [place] [phone]

## 2016-08-02 DIAGNOSIS — M545 Low back pain: Secondary | ICD-10-CM | POA: Diagnosis not present

## 2016-08-02 DIAGNOSIS — M5416 Radiculopathy, lumbar region: Secondary | ICD-10-CM | POA: Diagnosis not present

## 2016-08-05 DIAGNOSIS — Z0000 Encounter for general adult medical examination without abnormal findings: Secondary | ICD-10-CM | POA: Diagnosis not present

## 2016-08-05 DIAGNOSIS — Z9181 History of falling: Secondary | ICD-10-CM | POA: Diagnosis not present

## 2016-08-05 DIAGNOSIS — E1129 Type 2 diabetes mellitus with other diabetic kidney complication: Secondary | ICD-10-CM | POA: Diagnosis not present

## 2016-08-05 DIAGNOSIS — Z125 Encounter for screening for malignant neoplasm of prostate: Secondary | ICD-10-CM | POA: Diagnosis not present

## 2016-08-05 DIAGNOSIS — Z1389 Encounter for screening for other disorder: Secondary | ICD-10-CM | POA: Diagnosis not present

## 2016-08-05 DIAGNOSIS — Z136 Encounter for screening for cardiovascular disorders: Secondary | ICD-10-CM | POA: Diagnosis not present

## 2016-08-09 DIAGNOSIS — M9983 Other biomechanical lesions of lumbar region: Secondary | ICD-10-CM | POA: Diagnosis not present

## 2016-08-09 DIAGNOSIS — E119 Type 2 diabetes mellitus without complications: Secondary | ICD-10-CM | POA: Diagnosis not present

## 2016-08-09 DIAGNOSIS — M1611 Unilateral primary osteoarthritis, right hip: Secondary | ICD-10-CM | POA: Diagnosis not present

## 2016-08-09 DIAGNOSIS — M5416 Radiculopathy, lumbar region: Secondary | ICD-10-CM | POA: Diagnosis not present

## 2016-08-18 DIAGNOSIS — M5416 Radiculopathy, lumbar region: Secondary | ICD-10-CM | POA: Diagnosis not present

## 2016-08-18 DIAGNOSIS — M9983 Other biomechanical lesions of lumbar region: Secondary | ICD-10-CM | POA: Diagnosis not present

## 2016-08-31 DIAGNOSIS — M1611 Unilateral primary osteoarthritis, right hip: Secondary | ICD-10-CM | POA: Diagnosis not present

## 2016-09-01 DIAGNOSIS — M1611 Unilateral primary osteoarthritis, right hip: Secondary | ICD-10-CM | POA: Diagnosis not present

## 2016-09-01 DIAGNOSIS — Z6828 Body mass index (BMI) 28.0-28.9, adult: Secondary | ICD-10-CM | POA: Diagnosis not present

## 2016-09-13 DIAGNOSIS — Z139 Encounter for screening, unspecified: Secondary | ICD-10-CM | POA: Diagnosis not present

## 2016-09-13 DIAGNOSIS — E785 Hyperlipidemia, unspecified: Secondary | ICD-10-CM | POA: Diagnosis not present

## 2016-09-13 DIAGNOSIS — R600 Localized edema: Secondary | ICD-10-CM | POA: Diagnosis not present

## 2016-09-13 DIAGNOSIS — I639 Cerebral infarction, unspecified: Secondary | ICD-10-CM | POA: Diagnosis not present

## 2016-09-13 DIAGNOSIS — M1611 Unilateral primary osteoarthritis, right hip: Secondary | ICD-10-CM | POA: Diagnosis not present

## 2016-09-13 DIAGNOSIS — E1129 Type 2 diabetes mellitus with other diabetic kidney complication: Secondary | ICD-10-CM | POA: Diagnosis not present

## 2016-09-13 DIAGNOSIS — I10 Essential (primary) hypertension: Secondary | ICD-10-CM | POA: Diagnosis not present

## 2016-09-13 DIAGNOSIS — D638 Anemia in other chronic diseases classified elsewhere: Secondary | ICD-10-CM | POA: Diagnosis not present

## 2016-09-16 DIAGNOSIS — E042 Nontoxic multinodular goiter: Secondary | ICD-10-CM | POA: Diagnosis not present

## 2016-09-16 DIAGNOSIS — E049 Nontoxic goiter, unspecified: Secondary | ICD-10-CM | POA: Diagnosis not present

## 2016-09-22 DIAGNOSIS — Z933 Colostomy status: Secondary | ICD-10-CM | POA: Diagnosis not present

## 2016-09-27 DIAGNOSIS — E11649 Type 2 diabetes mellitus with hypoglycemia without coma: Secondary | ICD-10-CM | POA: Diagnosis not present

## 2016-09-28 DIAGNOSIS — R490 Dysphonia: Secondary | ICD-10-CM | POA: Diagnosis not present

## 2016-09-28 DIAGNOSIS — R0981 Nasal congestion: Secondary | ICD-10-CM | POA: Diagnosis not present

## 2016-09-28 DIAGNOSIS — J342 Deviated nasal septum: Secondary | ICD-10-CM | POA: Diagnosis not present

## 2016-09-28 DIAGNOSIS — J383 Other diseases of vocal cords: Secondary | ICD-10-CM | POA: Diagnosis not present

## 2016-09-28 DIAGNOSIS — E042 Nontoxic multinodular goiter: Secondary | ICD-10-CM | POA: Diagnosis not present

## 2016-10-27 DIAGNOSIS — M5136 Other intervertebral disc degeneration, lumbar region: Secondary | ICD-10-CM | POA: Diagnosis not present

## 2016-10-27 DIAGNOSIS — E108 Type 1 diabetes mellitus with unspecified complications: Secondary | ICD-10-CM | POA: Diagnosis not present

## 2016-10-27 DIAGNOSIS — M85851 Other specified disorders of bone density and structure, right thigh: Secondary | ICD-10-CM | POA: Diagnosis not present

## 2016-10-27 DIAGNOSIS — S73001A Unspecified subluxation of right hip, initial encounter: Secondary | ICD-10-CM | POA: Diagnosis not present

## 2016-10-27 DIAGNOSIS — M1611 Unilateral primary osteoarthritis, right hip: Secondary | ICD-10-CM | POA: Diagnosis not present

## 2016-10-28 DIAGNOSIS — I712 Thoracic aortic aneurysm, without rupture: Secondary | ICD-10-CM | POA: Diagnosis not present

## 2016-10-28 DIAGNOSIS — M1611 Unilateral primary osteoarthritis, right hip: Secondary | ICD-10-CM | POA: Diagnosis not present

## 2016-10-28 DIAGNOSIS — M25551 Pain in right hip: Secondary | ICD-10-CM | POA: Diagnosis not present

## 2016-10-28 DIAGNOSIS — Z952 Presence of prosthetic heart valve: Secondary | ICD-10-CM | POA: Diagnosis not present

## 2016-10-28 DIAGNOSIS — I340 Nonrheumatic mitral (valve) insufficiency: Secondary | ICD-10-CM | POA: Diagnosis not present

## 2016-10-28 DIAGNOSIS — Z6829 Body mass index (BMI) 29.0-29.9, adult: Secondary | ICD-10-CM | POA: Diagnosis not present

## 2016-10-28 DIAGNOSIS — I2510 Atherosclerotic heart disease of native coronary artery without angina pectoris: Secondary | ICD-10-CM | POA: Diagnosis not present

## 2016-11-04 DIAGNOSIS — Z6829 Body mass index (BMI) 29.0-29.9, adult: Secondary | ICD-10-CM | POA: Diagnosis not present

## 2016-11-04 DIAGNOSIS — M25551 Pain in right hip: Secondary | ICD-10-CM | POA: Diagnosis not present

## 2016-11-04 DIAGNOSIS — M1611 Unilateral primary osteoarthritis, right hip: Secondary | ICD-10-CM | POA: Diagnosis not present

## 2016-11-08 DIAGNOSIS — Z7982 Long term (current) use of aspirin: Secondary | ICD-10-CM | POA: Diagnosis not present

## 2016-11-08 DIAGNOSIS — Z01818 Encounter for other preprocedural examination: Secondary | ICD-10-CM | POA: Diagnosis not present

## 2016-11-08 DIAGNOSIS — I2510 Atherosclerotic heart disease of native coronary artery without angina pectoris: Secondary | ICD-10-CM | POA: Diagnosis not present

## 2016-11-08 DIAGNOSIS — E119 Type 2 diabetes mellitus without complications: Secondary | ICD-10-CM | POA: Diagnosis not present

## 2016-11-08 DIAGNOSIS — E785 Hyperlipidemia, unspecified: Secondary | ICD-10-CM | POA: Diagnosis not present

## 2016-11-08 DIAGNOSIS — R55 Syncope and collapse: Secondary | ICD-10-CM | POA: Diagnosis not present

## 2016-11-08 DIAGNOSIS — Z01810 Encounter for preprocedural cardiovascular examination: Secondary | ICD-10-CM | POA: Diagnosis not present

## 2016-11-08 DIAGNOSIS — Z79899 Other long term (current) drug therapy: Secondary | ICD-10-CM | POA: Diagnosis not present

## 2016-11-08 DIAGNOSIS — I712 Thoracic aortic aneurysm, without rupture: Secondary | ICD-10-CM | POA: Diagnosis not present

## 2016-11-09 DIAGNOSIS — E11649 Type 2 diabetes mellitus with hypoglycemia without coma: Secondary | ICD-10-CM | POA: Diagnosis not present

## 2016-11-11 DIAGNOSIS — C61 Malignant neoplasm of prostate: Secondary | ICD-10-CM | POA: Diagnosis not present

## 2016-11-11 DIAGNOSIS — N3289 Other specified disorders of bladder: Secondary | ICD-10-CM | POA: Diagnosis not present

## 2016-11-26 DIAGNOSIS — M25551 Pain in right hip: Secondary | ICD-10-CM | POA: Diagnosis not present

## 2016-11-29 DIAGNOSIS — Z933 Colostomy status: Secondary | ICD-10-CM | POA: Diagnosis not present

## 2016-12-14 DIAGNOSIS — M25551 Pain in right hip: Secondary | ICD-10-CM | POA: Diagnosis not present

## 2016-12-14 DIAGNOSIS — R269 Unspecified abnormalities of gait and mobility: Secondary | ICD-10-CM | POA: Diagnosis not present

## 2016-12-14 DIAGNOSIS — G894 Chronic pain syndrome: Secondary | ICD-10-CM | POA: Diagnosis not present

## 2016-12-15 DIAGNOSIS — I639 Cerebral infarction, unspecified: Secondary | ICD-10-CM | POA: Insufficient documentation

## 2016-12-15 DIAGNOSIS — M25551 Pain in right hip: Secondary | ICD-10-CM | POA: Insufficient documentation

## 2016-12-15 HISTORY — DX: Pain in right hip: M25.551

## 2016-12-20 DIAGNOSIS — I639 Cerebral infarction, unspecified: Secondary | ICD-10-CM | POA: Diagnosis not present

## 2016-12-20 DIAGNOSIS — I10 Essential (primary) hypertension: Secondary | ICD-10-CM | POA: Diagnosis not present

## 2016-12-20 DIAGNOSIS — H6121 Impacted cerumen, right ear: Secondary | ICD-10-CM | POA: Diagnosis not present

## 2016-12-20 DIAGNOSIS — D638 Anemia in other chronic diseases classified elsewhere: Secondary | ICD-10-CM | POA: Diagnosis not present

## 2016-12-20 DIAGNOSIS — M25551 Pain in right hip: Secondary | ICD-10-CM | POA: Diagnosis not present

## 2016-12-20 DIAGNOSIS — E785 Hyperlipidemia, unspecified: Secondary | ICD-10-CM | POA: Diagnosis not present

## 2016-12-20 DIAGNOSIS — R600 Localized edema: Secondary | ICD-10-CM | POA: Diagnosis not present

## 2016-12-20 DIAGNOSIS — Z23 Encounter for immunization: Secondary | ICD-10-CM | POA: Diagnosis not present

## 2016-12-20 DIAGNOSIS — E1129 Type 2 diabetes mellitus with other diabetic kidney complication: Secondary | ICD-10-CM | POA: Diagnosis not present

## 2016-12-29 DIAGNOSIS — M25551 Pain in right hip: Secondary | ICD-10-CM | POA: Diagnosis not present

## 2017-01-12 DIAGNOSIS — M792 Neuralgia and neuritis, unspecified: Secondary | ICD-10-CM | POA: Diagnosis not present

## 2017-01-12 DIAGNOSIS — M25551 Pain in right hip: Secondary | ICD-10-CM | POA: Diagnosis not present

## 2017-02-02 DIAGNOSIS — M792 Neuralgia and neuritis, unspecified: Secondary | ICD-10-CM | POA: Diagnosis not present

## 2017-02-02 DIAGNOSIS — G8929 Other chronic pain: Secondary | ICD-10-CM | POA: Diagnosis not present

## 2017-02-02 DIAGNOSIS — M25551 Pain in right hip: Secondary | ICD-10-CM | POA: Diagnosis not present

## 2017-03-13 DIAGNOSIS — J0100 Acute maxillary sinusitis, unspecified: Secondary | ICD-10-CM | POA: Diagnosis not present

## 2017-03-14 DIAGNOSIS — C61 Malignant neoplasm of prostate: Secondary | ICD-10-CM | POA: Diagnosis not present

## 2017-03-14 DIAGNOSIS — N3289 Other specified disorders of bladder: Secondary | ICD-10-CM | POA: Diagnosis not present

## 2017-03-22 DIAGNOSIS — R600 Localized edema: Secondary | ICD-10-CM | POA: Diagnosis not present

## 2017-03-22 DIAGNOSIS — E1129 Type 2 diabetes mellitus with other diabetic kidney complication: Secondary | ICD-10-CM | POA: Diagnosis not present

## 2017-03-22 DIAGNOSIS — I639 Cerebral infarction, unspecified: Secondary | ICD-10-CM | POA: Diagnosis not present

## 2017-03-22 DIAGNOSIS — D638 Anemia in other chronic diseases classified elsewhere: Secondary | ICD-10-CM | POA: Diagnosis not present

## 2017-03-22 DIAGNOSIS — I10 Essential (primary) hypertension: Secondary | ICD-10-CM | POA: Diagnosis not present

## 2017-03-22 DIAGNOSIS — M1611 Unilateral primary osteoarthritis, right hip: Secondary | ICD-10-CM | POA: Diagnosis not present

## 2017-03-22 DIAGNOSIS — E785 Hyperlipidemia, unspecified: Secondary | ICD-10-CM | POA: Diagnosis not present

## 2017-04-23 DIAGNOSIS — R0602 Shortness of breath: Secondary | ICD-10-CM | POA: Diagnosis not present

## 2017-04-23 DIAGNOSIS — R05 Cough: Secondary | ICD-10-CM | POA: Diagnosis not present

## 2017-05-05 DIAGNOSIS — I5032 Chronic diastolic (congestive) heart failure: Secondary | ICD-10-CM

## 2017-05-05 HISTORY — DX: Chronic diastolic (congestive) heart failure: I50.32

## 2017-05-05 NOTE — Progress Notes (Signed)
Cardiology Office Note:    Date:  05/06/2017   ID:  [name], DOB [birth year], MRN [id number]  PCP:  Nicoletta Dress, MD  Cardiologist:  Shirlee More, MD    Referring MD: Nicoletta Dress, MD    ASSESSMENT:    1. Coronary artery disease involving native coronary artery of native heart without angina pectoris   2. Thoracic aortic aneurysm without rupture ([place])   3. Chronic diastolic heart failure ([place])   4. S/P aortic valve replacement with bioprosthetic valve    PLAN:    In order of problems listed above:  1. He has known mild nonobstructive CAD from remote coronary angiography he is at risk for progressive CAD we will start an evaluation with a troponin if normal will address the need for myocardial perfusion study at his next visit. 2. At risk for symptoms expansion leak each dissection and will undergo CTA. 3. Appears decompensated with peripheral edema but his blood pressure is low today we will check a BMP level and at the moment continue his current diuretic. 4. I am concerned about valve dysfunction by physical examination echocardiogram ordered.   Next appointment: 2 weeks   Medication Adjustments/Labs and Tests Ordered: Current medicines are reviewed at length with the patient today.  Concerns regarding medicines are outlined above.  Orders Placed This Encounter  Procedures  . CT ANGIO CHEST AORTA W &/OR WO CONTRAST  . Basic metabolic panel  . Pro b natriuretic peptide (BNP)  . Troponin I  . EKG 12-Lead  . ECHOCARDIOGRAM COMPLETE   No orders of the defined types were placed in this encounter.   Chief Complaint  Patient presents with  . New Patient (Initial Visit)    self referral to evaluate CP  . Chest Pain    History of Present Illness:    Jonathan Ryan is a 84 y.o. male with a hx of non-obstructive CAD, AS s/p AVR (2013), hyperlipidemia, type II DM, and thoracic aneurysmal disease last seen by Dr Geraldo Pitter 12/17/14. A TTE in April 2017 at  [facility] showed EF 55% and normal AVR function P/M 22/12 mmHg. He was evaluated by Dr Curt Bears EP July 2017 for presyncope with a normal 30 day event monitor. Compliance with diet, lifestyle and medications: Yes I knew Mr. Gallery from caring for his wife who had severe refractory end-stage heart failure he is dramatically deteriorated from that time and because of hip pain is now relegated to a wheelchair.  He has been followed at [facility] for a very large thoracic aortic aneurysm he is missed his last CT scan.  He presents to my office today because he has had several episodes that have caused him to be apprehensive he has been seen in urgent care in the last few weeks records are requested where he had predominant back pain and weakness but also shortness of breath.  Has a background history of heart failure and takes a loop diuretic.  His last episode he describes as a spell on a Saturday night had some element of chest discomfort and he did not seek attention.  He has had no recurrent chest pain.  He tells me he has diffuse thoracic and lumbosacral disc disease.  He is not having typical angina no palpitations syncope TIA and has had no fever or chills. Past Medical History:  Diagnosis Date  . Aortic valve disorder 06/11/2015  . Arthritis   . Blood transfusion without reported diagnosis   . Carotid artery  disease ([place]) 06/11/2015  . Cataract   . CHF (congestive heart failure) ([place])   . Colostomy in place [facility]) 06/11/2015  . Coronary artery disease   . Coronary artery disease involving native coronary artery of native heart without angina pectoris 01/06/2015  . Descending thoracic aortic aneurysm ([place])   . Diabetes mellitus without complication ([place])   . Diverticular disease of left colon   . Emphysema of lung ([place])    as a child  . GERD (gastroesophageal reflux disease)   . HTN (hypertension) 06/11/2015  . Hyperlipidemia 01/06/2015  . Hypertension   . Near syncope 06/10/2015  . Pre-syncope 06/10/2015    . Right hip pain 12/15/2016   Overview:  Added automatically from request for surgery [id number] Overview:  Added automatically from request for surgery [phone]  . S/P aortic valve replacement with bioprosthetic valve 01/06/2015  . Statin intolerance 01/06/2015  . Stroke ([place])    08/01/2015  . Thoracic aortic aneurysm without rupture ([place]) 01/06/2015  . Thoracic ascending aortic aneurysm ([place])   . Thyroid disease    reports nodules  . Type 2 diabetes mellitus with complication ([place]) 11/11/4233    Past Surgical History:  Procedure Laterality Date  . AORTIC VALVE REPAIR    . COLON SURGERY    . HERNIA REPAIR    . SMALL INTESTINE SURGERY      Current Medications: Current Meds  Medication Sig  . aspirin 325 MG tablet Take 325 mg by mouth daily.  Marland Kitchen atorvastatin (LIPITOR) 20 MG tablet Take 20 mg by mouth daily.   . Cholecalciferol (VITAMIN D3) 1000 units CAPS Take 1 capsule by mouth daily.  . finasteride (PROSCAR) 5 MG tablet Take 5 mg by mouth daily.  . furosemide (LASIX) 40 MG tablet Take 40 mg by mouth daily.   Marland Kitchen glimepiride (AMARYL) 2 MG tablet Take 2 mg by mouth 2 (two) times daily.   . insulin detemir (LEVEMIR) 100 UNIT/ML injection Inject 25 Units into the skin at bedtime.   . metFORMIN (GLUCOPHAGE) 500 MG tablet Take 1,000 mg by mouth 2 (two) times daily.  . metoprolol tartrate (LOPRESSOR) 25 MG tablet Take 12.5 mg by mouth 2 (two) times daily.  . Multiple Vitamin (MULTI-VITAMINS) TABS Take 2 tablets by mouth daily.  Marland Kitchen zinc gluconate 50 MG tablet Take 50 mg by mouth daily.     Allergies:   Penicillins; Vancomycin; and Ramipril   Social History   Socioeconomic History  . Marital status: Single    Spouse name: None  . Number of children: None  . Years of education: None  . Highest education level: None  Social Needs  . Financial resource strain: None  . Food insecurity - worry: None  . Food insecurity - inability: None  . Transportation needs - medical: None  . Transportation  needs - non-medical: None  Occupational History  . None  Tobacco Use  . Smoking status: Former Smoker    Types: Cigarettes  . Smokeless tobacco: Never Used  . Tobacco comment: Smoked 2 packs/ week as a teenager   Substance and Sexual Activity  . Alcohol use: No    Alcohol/week: 0.0 oz  . Drug use: No  . Sexual activity: None  Other Topics Concern  . None  Social History Narrative  . None     Family History: The patient's family history includes Heart attack in his father. ROS:   Please see the history of present illness.    All other systems reviewed and are  negative.  EKGs/Labs/Other Studies Reviewed:    The following studies were reviewed today:  EKG:  EKG ordered today.  The ekg ordered today demonstrates sinus rhythm normal EKG  CTA 06/10/15: Mediastinum: Normal heart size. Previous median sternotomy and aortic valve repair. Aortic atherosclerosis identified calcification involving the LAD and left circumflex coronary artery identified. The ascending thoracic aorta measures 4.7 cm in diameter. The transverse aortic arch measures 4.2 cm. The descending thoracic aorta measures 5 cm and maximum dimension. There is no aortic Dissection.   CTA April 2018:Three-vessel aortic arch. Ascending thoracic aorta remains dilated measuring up to 5.3 x 5.6 cm, previously 5.1 x 5.0 cm. Persistent aneurysmal dilatation of the aortic arch and proximal thoracic aorta, slightly tapering at the level of the hiatus. Proximal descending thoracic aorta measures 5.0 x 5.0 cm in maximal orthogonal dimension, previously 5.7 x 4.9 cm.  Recent Labs: No results found for requested labs within last 8760 hours.  Recent Lipid Panel No results found for: CHOL, TRIG, HDL, CHOLHDL, VLDL, LDLCALC, LDLDIRECT  Physical Exam:    VS:  BP (!) 92/56 (BP Location: Right Arm, Patient Position: Sitting, Cuff Size: Large)   Pulse 94   Ht 5\' 9"  (1.753 m)   Wt 187 lb (84.8 kg)   SpO2 96%   BMI 27.62 kg/m       Wt Readings from Last 3 Encounters:  05/06/17 187 lb (84.8 kg)  01/01/16 194 lb 9.6 oz (88.3 kg)  12/11/15 197 lb (89.4 kg)     GEN: he looks frail  in no acute distress HEENT: Normal NECK: No JVD; No carotid bruits LYMPHATICS: No lymphadenopathy CARDIAC: 2-3/6 SEM harsh to the right carotid mid peak but single s2 no AR RESPIRATORY:  Clear to auscultation without rales, wheezing or rhonchi  ABDOMEN: Soft, non-tender, non-distended MUSCULOSKELETAL:  1+ bilateral edema; No deformity  SKIN: Warm and dry NEUROLOGIC:  Alert and oriented x 3 PSYCHIATRIC:  Normal affect    Signed, Shirlee More, MD  05/06/2017 12:51 PM    [facility]

## 2017-05-06 ENCOUNTER — Ambulatory Visit: Payer: PPO | Admitting: Cardiology

## 2017-05-06 ENCOUNTER — Encounter: Payer: Self-pay | Admitting: Cardiology

## 2017-05-06 DIAGNOSIS — I712 Thoracic aortic aneurysm, without rupture, unspecified: Secondary | ICD-10-CM

## 2017-05-06 DIAGNOSIS — I5032 Chronic diastolic (congestive) heart failure: Secondary | ICD-10-CM

## 2017-05-06 DIAGNOSIS — I2510 Atherosclerotic heart disease of native coronary artery without angina pectoris: Secondary | ICD-10-CM | POA: Diagnosis not present

## 2017-05-06 DIAGNOSIS — Z953 Presence of xenogenic heart valve: Secondary | ICD-10-CM

## 2017-05-06 NOTE — Patient Instructions (Signed)
Medication Instructions:  Your physician recommends that you continue on your current medications as directed. Please refer to the Current Medication list given to you today.  Labwork: Your physician recommends that you return for lab work in: today. BMP, BNP, troponin  Testing/Procedures: You had an EKG today.  Your physician has requested that you have an echocardiogram. Echocardiography is a painless test that uses sound waves to create images of your heart. It provides your doctor with information about the size and shape of your heart and how well your heart's chambers and valves are working. This procedure takes approximately one hour. There are no restrictions for this procedure.  Non-Cardiac CT Angiography (CTA), is a special type of CT scan that uses a computer to produce multi-dimensional views of major blood vessels throughout the body. In CT angiography, a contrast material is injected through an IV to help visualize the blood vessels.  Follow-Up: Your physician recommends that you schedule a follow-up appointment in: 1 week.  Any Other Special Instructions Will Be Listed Below (If Applicable).     If you need a refill on your cardiac medications before your next appointment, please call your pharmacy.

## 2017-05-07 LAB — BASIC METABOLIC PANEL
BUN / CREAT RATIO: 20 (ref 10–24)
BUN: 17 mg/dL (ref 8–27)
CHLORIDE: 99 mmol/L (ref 96–106)
CO2: 27 mmol/L (ref 20–29)
CREATININE: 0.87 mg/dL (ref 0.76–1.27)
Calcium: 9.4 mg/dL (ref 8.6–10.2)
GFR calc Af Amer: 90 mL/min/{1.73_m2} (ref >59)
GFR calc non Af Amer: 78 mL/min/{1.73_m2} (ref >59)
GLUCOSE: 248 mg/dL — AB (ref 65–99)
POTASSIUM: 3.9 mmol/L (ref 3.5–5.2)
Sodium: 141 mmol/L (ref 134–144)

## 2017-05-07 LAB — PRO B NATRIURETIC PEPTIDE: NT-PRO BNP: 292 pg/mL (ref 0–486)

## 2017-05-07 LAB — TROPONIN I

## 2017-05-12 ENCOUNTER — Encounter (HOSPITAL_BASED_OUTPATIENT_CLINIC_OR_DEPARTMENT_OTHER): Payer: Self-pay

## 2017-05-12 ENCOUNTER — Ambulatory Visit (HOSPITAL_BASED_OUTPATIENT_CLINIC_OR_DEPARTMENT_OTHER)
Admission: RE | Admit: 2017-05-12 | Discharge: 2017-05-12 | Disposition: A | Discharge status: Home / Self Care | Payer: PPO | Source: Ambulatory Visit | Attending: Cardiology | Admitting: Cardiology

## 2017-05-12 DIAGNOSIS — I700 Atherosclerosis of aorta: Secondary | ICD-10-CM | POA: Diagnosis not present

## 2017-05-12 DIAGNOSIS — I5032 Chronic diastolic (congestive) heart failure: Secondary | ICD-10-CM

## 2017-05-12 DIAGNOSIS — Z953 Presence of xenogenic heart valve: Secondary | ICD-10-CM | POA: Diagnosis not present

## 2017-05-12 DIAGNOSIS — K8020 Calculus of gallbladder without cholecystitis without obstruction: Secondary | ICD-10-CM | POA: Diagnosis not present

## 2017-05-12 DIAGNOSIS — I712 Thoracic aortic aneurysm, without rupture, unspecified: Secondary | ICD-10-CM

## 2017-05-12 DIAGNOSIS — I2510 Atherosclerotic heart disease of native coronary artery without angina pectoris: Secondary | ICD-10-CM | POA: Diagnosis not present

## 2017-05-12 DIAGNOSIS — I082 Rheumatic disorders of both aortic and tricuspid valves: Secondary | ICD-10-CM | POA: Diagnosis not present

## 2017-05-12 DIAGNOSIS — Z952 Presence of prosthetic heart valve: Secondary | ICD-10-CM | POA: Diagnosis not present

## 2017-05-12 DIAGNOSIS — I719 Aortic aneurysm of unspecified site, without rupture: Secondary | ICD-10-CM | POA: Diagnosis not present

## 2017-05-12 MED ORDER — IOPAMIDOL (ISOVUE-370) INJECTION 76%
100.0000 mL | Freq: Once | INTRAVENOUS | Status: AC | PRN
  Administered 2017-05-12: 100 mL via INTRAVENOUS

## 2017-05-12 NOTE — Progress Notes (Signed)
Echocardiogram 2D Echocardiogram has been performed.  Jonathan Ryan 05/12/2017, 11:33 AM

## 2017-05-12 NOTE — Progress Notes (Signed)
Cardiology Office Note:    Date:  05/13/2017   ID:  [name], DOB [birth year], MRN [id number]  PCP:  Nicoletta Dress, MD  Cardiologist:  Shirlee More, MD    Referring MD: Nicoletta Dress, MD    ASSESSMENT:    1. Mild CAD   2. Thoracic aortic aneurysm without rupture ([place])   3. S/P aortic valve replacement with bioprosthetic valve   4. Chronic diastolic heart failure ([place])   5. Hypertensive heart disease with heart failure ([place])    PLAN:     In order of problems listed above:  1. With recent atypical chest pain he undergo myocardial perfusion study.  If he has high risk markers he would benefit from coronary angiography and revascularization.  He will continue treatment with aspirin blocker and high intensity statin 2. Appears stable I will write a copy of the report to vascular surgery [facility] who did not want to intervene for his posterior thoracic aortic aneurysm 3. Stable function clinically on recent echocardiogram 4. Improved no volume overload continue diuretic and sodium restriction 5. Stable continue current treatment including diuretic beta-blocker  6 weeks Next appointment 6 weeks    Medication Adjustments/Labs and Tests Ordered: Current medicines are reviewed at length with the patient today.  Concerns regarding medicines are outlined above.  Orders Placed This Encounter  Procedures  . Myocardial Perfusion Imaging   No orders of the defined types were placed in this encounter.   Chief Complaint  Patient presents with  . Follow-up    after CTA and echo     History of Present Illness:    Jonathan Ryan is a 84 y.o. male with a hx of non-obstructive CAD, AS s/p AVR (2013), Heart failure, hyperlipidemia, type II DM, and thoracic aneurysmal disease last seen by Dr Geraldo Pitter 12/17/14. A TTE in April 2017 at [facility] showed EF 55% and normal AVR function P/M 22/12 mmHg. He was evaluated by Dr Curt Bears EP July 2017 for presyncope with a normal 30 day event  monitor. last seen one week ago with chest pain and decompensated heart failure.  05/06/17:  ASSESSMENT:    1. Coronary artery disease involving native coronary artery of native heart without angina pectoris   2. Thoracic aortic aneurysm without rupture ([place])   3. Chronic diastolic heart failure ([place])   4. S/P aortic valve replacement with bioprosthetic valve    PLAN:    In order of problems listed above: 6. He has known mild nonobstructive CAD from remote coronary angiography he is at risk for progressive CAD we will start an evaluation with a troponin if normal will address the need for myocardial perfusion study at his next visit. 7. At risk for symptoms expansion leak each dissection and will undergo CTA. 8. Appears decompensated with peripheral edema but his blood pressure is low today we will check a BMP level and at the moment continue his current diuretic. 9. I am concerned about valve dysfunction by physical examination echocardiogram ordered.  Compliance with diet, lifestyle and medications: Yes  He is improved compared to last visit his biggest concern is hip pain in my opinion about him is a candidate for surgery.  When questioned he said he was deferred because of uncontrolled diabetes. Presently is not having edema shortness of breath chest pain palpitation or syncope. Echocardiogram shows stable normal valve function normal ejection fraction CTA shows stable posterior thoracic aortic aneurysm and some enlargement anteriorly but not at threshold for surgical intervention.  I will route a copy of the report to vascular surgery [facility] Past Medical History:  Diagnosis Date  . Aortic valve disorder 06/11/2015  . Arthritis   . Blood transfusion without reported diagnosis   . Carotid artery disease ([place]) 06/11/2015  . Cataract   . CHF (congestive heart failure) ([place])   . Colostomy in place [facility]) 06/11/2015  . Coronary artery disease   . Coronary artery disease involving native  coronary artery of native heart without angina pectoris 01/06/2015  . Descending thoracic aortic aneurysm ([place])   . Diabetes mellitus without complication ([place])   . Diverticular disease of left colon   . Emphysema of lung ([place])    as a child  . GERD (gastroesophageal reflux disease)   . HTN (hypertension) 06/11/2015  . Hyperlipidemia 01/06/2015  . Hypertension   . Near syncope 06/10/2015  . Pre-syncope 06/10/2015  . Right hip pain 12/15/2016   Overview:  Added automatically from request for surgery [id number] Overview:  Added automatically from request for surgery [id number]  . S/P aortic valve replacement with bioprosthetic valve 01/06/2015  . Statin intolerance 01/06/2015  . Stroke ([place])    08/01/2015  . Thoracic aortic aneurysm without rupture ([place]) 01/06/2015  . Thoracic ascending aortic aneurysm ([place])   . Thyroid disease    reports nodules  . Type 2 diabetes mellitus with complication ([place]) 10/16/8240    Past Surgical History:  Procedure Laterality Date  . AORTIC VALVE REPAIR    . COLON SURGERY    . HERNIA REPAIR    . SMALL INTESTINE SURGERY      Current Medications: No outpatient medications have been marked as taking for the 05/13/17 encounter (Office Visit) with Richardo Priest, MD.     Allergies:   Penicillins; Vancomycin; and Ramipril   Social History   Socioeconomic History  . Marital status: Widowed    Spouse name: None  . Number of children: None  . Years of education: None  . Highest education level: None  Social Needs  . Financial resource strain: None  . Food insecurity - worry: None  . Food insecurity - inability: None  . Transportation needs - medical: None  . Transportation needs - non-medical: None  Occupational History  . None  Tobacco Use  . Smoking status: Former Smoker    Types: Cigarettes  . Smokeless tobacco: Never Used  . Tobacco comment: Smoked 2 packs/ week as a teenager   Substance and Sexual Activity  . Alcohol use: No    Alcohol/week: 0.0 oz  .  Drug use: No  . Sexual activity: None  Other Topics Concern  . None  Social History Narrative  . None     Family History: The patient's family history includes Heart attack in his father. ROS:   Please see the history of present illness.    All other systems reviewed and are negative.  EKGs/Labs/Other Studies Reviewed:    The following studies were reviewed today:    Echo TTE: Impressions: - Normal LVEF.   Bioprostetic aortic valve with mild - moderate stenosis. (Peak/mean gradient - 25/13 - was 22/12 in Apr 2017) Vmax,3   Trace MR.   Moderate TR.   Mild pulmonary HTN.   Moderate LAE.   Moderate ascending aorte enlargement ( 51 mm by echo, 53x56 by CT)  Chest CTA: IMPRESSION: Aneurysmal dilatation of the ascending aorta has increased from a previous diameter of 4.7 cm to a present diameter of 5.2 cm. Maximal diameter of the aortic  arch is 4.2 cm. Maximal diameter of the proximal descending thoracic aorta is 5.7 cm. These findings are not significantly changed based on my direct measurements of the prior study. There is no evidence of intramural hematoma or aortic dissection Ascending thoracic aortic aneurysm. Recommend semi-annual imagingfollowup by CTA or MRA and referral to cardiothoracic surgery if not already obtained.  Recent Labs: troponin I , 0.01  05/06/2017: BUN 17; Creatinine, Ser 0.87; NT-Pro BNP 292; Potassium 3.9; Sodium 141 BNP 292, troponin I undetectable Recent Lipid Panel No results found for: CHOL, TRIG, HDL, CHOLHDL, VLDL, LDLCALC, LDLDIRECT  Physical Exam:    VS:  BP 112/68 (BP Location: Left Arm, Patient Position: Sitting, Cuff Size: Normal)   Pulse 77   Ht 5\' 9"  (1.753 m)   Wt 185 lb 1.9 oz (84 kg)   SpO2 98%   BMI 27.34 kg/m     Wt Readings from Last 3 Encounters:  05/13/17 185 lb 1.9 oz (84 kg)  05/06/17 187 lb (84.8 kg)  01/01/16 194 lb 9.6 oz (88.3 kg)     GEN: Lkks much approved  Well nourished, well developed in no acute  distress HEENT: Normal NECK: No JVD; No carotid bruits LYMPHATICS: No lymphadenopathy CARDIAC: 2/6 SEM in aortic areaRRR, no murmurs, rubs, gallops RESPIRATORY:  Clear to auscultation without rales, wheezing or rhonchi  ABDOMEN: Soft, non-tender, non-distended MUSCULOSKELETAL:  No edema; No deformity  SKIN: Warm and dry NEUROLOGIC:  Alert and oriented x 3 PSYCHIATRIC:  Normal affect    Signed, Shirlee More, MD  05/13/2017 3:00 PM    [facility]

## 2017-05-13 ENCOUNTER — Encounter: Payer: Self-pay | Admitting: Cardiology

## 2017-05-13 ENCOUNTER — Ambulatory Visit (INDEPENDENT_AMBULATORY_CARE_PROVIDER_SITE_OTHER): Payer: PPO | Admitting: Cardiology

## 2017-05-13 VITALS — BP 112/68 | HR 77 | Ht 69.0 in | Wt 185.1 lb

## 2017-05-13 DIAGNOSIS — Z953 Presence of xenogenic heart valve: Secondary | ICD-10-CM | POA: Diagnosis not present

## 2017-05-13 DIAGNOSIS — I712 Thoracic aortic aneurysm, without rupture, unspecified: Secondary | ICD-10-CM

## 2017-05-13 DIAGNOSIS — I2510 Atherosclerotic heart disease of native coronary artery without angina pectoris: Secondary | ICD-10-CM

## 2017-05-13 DIAGNOSIS — I110 Hypertensive heart disease with heart failure: Secondary | ICD-10-CM | POA: Diagnosis not present

## 2017-05-13 DIAGNOSIS — I5032 Chronic diastolic (congestive) heart failure: Secondary | ICD-10-CM

## 2017-05-13 NOTE — Patient Instructions (Addendum)
Mediterranean Diet  Why follow it? Research shows. . Those who follow the Mediterranean diet have a reduced risk of heart disease  . The diet is associated with a reduced incidence of Parkinson's and Alzheimer's diseases . People following the diet may have longer life expectancies and lower rates of chronic diseases  . The Dietary Guidelines for Americans recommends the Mediterranean diet as an eating plan to promote health and prevent disease  What Is the Mediterranean Diet?  . Healthy eating plan based on typical foods and recipes of Mediterranean-style cooking . The diet is primarily a plant based diet; these foods should make up a majority of meals   Starches - Plant based foods should make up a majority of meals - They are an important sources of vitamins, minerals, energy, antioxidants, and fiber - Choose whole grains, foods high in fiber and minimally processed items  - Typical grain sources include wheat, oats, barley, corn, [place] rice, bulgar, farro, millet, polenta, couscous  - Various types of beans include chickpeas, lentils, fava beans, black beans, white beans   Fruits  Veggies - Large quantities of antioxidant rich fruits & veggies; 6 or more servings  - Vegetables can be eaten raw or lightly drizzled with oil and cooked  - Vegetables common to the traditional Mediterranean Diet include: artichokes, arugula, beets, broccoli, brussel sprouts, cabbage, carrots, celery, collard greens, cucumbers, eggplant, kale, leeks, lemons, lettuce, mushrooms, okra, onions, peas, peppers, potatoes, pumpkin, radishes, rutabaga, shallots, spinach, sweet potatoes, turnips, zucchini - Fruits common to the Mediterranean Diet include: apples, apricots, avocados, cherries, clementines, dates, figs, grapefruits, grapes, melons, nectarines, oranges, peaches, pears, pomegranates, strawberries, tangerines  Fats - Replace butter and margarine with healthy oils, such as olive oil, canola oil, and tahini    - Limit nuts to no more than a handful a day  - Nuts include walnuts, almonds, pecans, pistachios, pine nuts  - Limit or avoid candied, honey roasted or heavily salted nuts - Olives are central to the [employer] - can be eaten whole or used in a variety of dishes   Meats Protein - Limiting red meat: no more than a few times a month - When eating red meat: choose lean cuts and keep the portion to the size of deck of cards - Eggs: approx. 0 to 4 times a week  - Fish and lean poultry: at least 2 a week  - Healthy protein sources include, chicken, [place], lean beef, lamb - Increase intake of seafood such as tuna, salmon, trout, mackerel, shrimp, scallops - Avoid or limit high fat processed meats such as sausage and bacon  Dairy - Include moderate amounts of low fat dairy products  - Focus on healthy dairy such as fat free yogurt, skim milk, low or reduced fat cheese - Limit dairy products higher in fat such as whole or 2% milk, cheese, ice cream  Alcohol - Moderate amounts of red wine is ok  - No more than 5 oz daily for women (all ages) and men older than age 61  - No more than 10 oz of wine daily for men younger than 29  Other - Limit sweets and other desserts  - Use herbs and spices instead of salt to flavor foods  - Herbs and spices common to the traditional Mediterranean Diet include: basil, bay leaves, chives, cloves, cumin, fennel, garlic, lavender, marjoram, mint, oregano, parsley, pepper, rosemary, sage, savory, sumac, tarragon, thyme   It's not just a diet, it's a lifestyle:  .  The Mediterranean diet includes lifestyle factors typical of those in the region  . Foods, drinks and meals are best eaten with others and savored . Daily physical activity is important for overall good health . This could be strenuous exercise like running and aerobics . This could also be more leisurely activities such as walking, housework, yard-work, or taking the stairs . Moderation is the key;  a balanced and healthy diet accommodates most foods and drinks . Consider portion sizes and frequency of consumption of certain foods   Meal Ideas & Options:  . Breakfast:  o Whole wheat toast or whole wheat English muffins with peanut butter & hard boiled egg o Steel cut oats topped with apples & cinnamon and skim milk  o Fresh fruit: banana, strawberries, melon, berries, peaches  o Smoothies: strawberries, bananas, greek yogurt, peanut butter o Low fat greek yogurt with blueberries and granola  o Egg white omelet with spinach and mushrooms o Breakfast couscous: whole wheat couscous, apricots, skim milk, cranberries  . Sandwiches:  o Hummus and grilled vegetables (peppers, zucchini, squash) on whole wheat bread   o Grilled chicken on whole wheat pita with lettuce, tomatoes, cucumbers or tzatziki  o Tuna salad on whole wheat bread: tuna salad made with greek yogurt, olives, red peppers, capers, green onions o Garlic rosemary lamb pita: lamb sauted with garlic, rosemary, salt & pepper; add lettuce, cucumber, greek yogurt to pita - flavor with lemon juice and black pepper  . Seafood:  o Mediterranean grilled salmon, seasoned with garlic, basil, parsley, lemon juice and black pepper o Shrimp, lemon, and spinach whole-grain pasta salad made with low fat greek yogurt  o Seared scallops with lemon orzo  o Seared tuna steaks seasoned salt, pepper, coriander topped with tomato mixture of olives, tomatoes, olive oil, minced garlic, parsley, green onions and cappers  . Meats:  o Herbed greek chicken salad with kalamata olives, cucumber, feta  o Red bell peppers stuffed with spinach, bulgur, lean ground beef (or lentils) & topped with feta   o Kebabs: skewers of chicken, tomatoes, onions, zucchini, squash  o [place] burgers: made with red onions, mint, dill, lemon juice, feta cheese topped with roasted red peppers . Vegetarian o Cucumber salad: cucumbers, artichoke hearts, celery, red onion, feta  cheese, tossed in olive oil & lemon juice  o Hummus and whole grain pita points with a greek salad (lettuce, tomato, feta, olives, cucumbers, red onion) o Lentil soup with celery, carrots made with vegetable broth, garlic, salt and pepper  o Tabouli salad: parsley, bulgur, mint, scallions, cucumbers, tomato, radishes, lemon juice, olive oil, salt and pepper.   Medication Instructions:  Your physician recommends that you continue on your current medications as directed. Please refer to the Current Medication list given to you today.  Labwork: None  Testing/Procedures: Your physician has requested that you have a lexiscan myoview. For further information please visit [email or web address]. Please follow instruction sheet, as given.  Follow-Up: Your physician recommends that you schedule a follow-up appointment in: 6 weeks  Any Other Special Instructions Will Be Listed Below (If Applicable).     If you need a refill on your cardiac medications before your next appointment, please call your pharmacy.   [place], RN, BSN

## 2017-05-16 ENCOUNTER — Telehealth (HOSPITAL_COMMUNITY): Payer: Self-pay | Admitting: *Deleted

## 2017-05-16 NOTE — Telephone Encounter (Signed)
Patient given detailed instructions per Myocardial Perfusion Study Information Sheet for the test on 05/17/17 at 0745. Patient notified to arrive 15 minutes early and that it is imperative to arrive on time for appointment to keep from having the test rescheduled.  If you need to cancel or reschedule your appointment, please call the office within 24 hours of your appointment. . Patient verbalized understanding.Kadince Boxley, Ranae [place]

## 2017-05-17 ENCOUNTER — Ambulatory Visit (HOSPITAL_COMMUNITY): Payer: PPO | Attending: Cardiovascular Disease

## 2017-05-17 VITALS — Ht 69.0 in | Wt 185.0 lb

## 2017-05-17 DIAGNOSIS — R002 Palpitations: Secondary | ICD-10-CM

## 2017-05-17 DIAGNOSIS — I2510 Atherosclerotic heart disease of native coronary artery without angina pectoris: Secondary | ICD-10-CM | POA: Diagnosis not present

## 2017-05-17 LAB — MYOCARDIAL PERFUSION IMAGING
CHL CUP NUCLEAR SRS: 7
CHL CUP NUCLEAR SSS: 7
LHR: 0.31
LV dias vol: 76 mL (ref 62–150)
LV sys vol: 30 mL
NUC STRESS TID: 0.91
Peak HR: 87 {beats}/min
Rest HR: 69 {beats}/min
SDS: 0

## 2017-05-17 MED ORDER — TECHNETIUM TC 99M TETROFOSMIN IV KIT
31.2000 | PACK | Freq: Once | INTRAVENOUS | Status: AC | PRN
  Administered 2017-05-17: 31.2 via INTRAVENOUS
  Filled 2017-05-17: qty 32

## 2017-05-17 MED ORDER — REGADENOSON 0.4 MG/5ML IV SOLN
0.4000 mg | Freq: Once | INTRAVENOUS | Status: AC
  Administered 2017-05-17: 0.4 mg via INTRAVENOUS

## 2017-05-17 MED ORDER — TECHNETIUM TC 99M TETROFOSMIN IV KIT
10.7000 | PACK | Freq: Once | INTRAVENOUS | Status: AC | PRN
  Administered 2017-05-17: 10.7 via INTRAVENOUS
  Filled 2017-05-17: qty 11

## 2017-06-20 DIAGNOSIS — I639 Cerebral infarction, unspecified: Secondary | ICD-10-CM | POA: Diagnosis not present

## 2017-06-20 DIAGNOSIS — Z1211 Encounter for screening for malignant neoplasm of colon: Secondary | ICD-10-CM | POA: Diagnosis not present

## 2017-06-20 DIAGNOSIS — Z1331 Encounter for screening for depression: Secondary | ICD-10-CM | POA: Diagnosis not present

## 2017-06-20 DIAGNOSIS — D638 Anemia in other chronic diseases classified elsewhere: Secondary | ICD-10-CM | POA: Diagnosis not present

## 2017-06-20 DIAGNOSIS — Z139 Encounter for screening, unspecified: Secondary | ICD-10-CM | POA: Diagnosis not present

## 2017-06-20 DIAGNOSIS — R600 Localized edema: Secondary | ICD-10-CM | POA: Diagnosis not present

## 2017-06-20 DIAGNOSIS — E785 Hyperlipidemia, unspecified: Secondary | ICD-10-CM | POA: Diagnosis not present

## 2017-06-20 DIAGNOSIS — Z6827 Body mass index (BMI) 27.0-27.9, adult: Secondary | ICD-10-CM | POA: Diagnosis not present

## 2017-06-20 DIAGNOSIS — I10 Essential (primary) hypertension: Secondary | ICD-10-CM | POA: Diagnosis not present

## 2017-06-20 DIAGNOSIS — E1129 Type 2 diabetes mellitus with other diabetic kidney complication: Secondary | ICD-10-CM | POA: Diagnosis not present

## 2017-06-26 NOTE — Progress Notes (Deleted)
Cardiology Office Note:    Date:  06/26/2017   ID:  Jonathan Ryan, DOB 11/14/1928, MRN [id number]  PCP:  Nicoletta Dress, MD  Cardiologist:  Shirlee More, MD    Referring MD: Nicoletta Dress, MD    ASSESSMENT:    1. Mild CAD   2. Chronic diastolic heart failure ([place])   3. Hypertensive heart disease with heart failure ([place])   4. S/P aortic valve replacement with bioprosthetic valve   5. Mixed hyperlipidemia    PLAN:    In order of problems listed above:  1. ***   Next appointment: ***   Medication Adjustments/Labs and Tests Ordered: Current medicines are reviewed at length with the patient today.  Concerns regarding medicines are outlined above.  No orders of the defined types were placed in this encounter.  No orders of the defined types were placed in this encounter.   No chief complaint on file.   History of Present Illness:    Jonathan Ryan is a 84 y.o. male with a hx of non-obstructive CAD, AS s/p AVR (2013), Heart failure, hyperlipidemia, type II DM, and thoracic aortic aneurysm last seen 05/13/17..  ASSESSMENT:    05/13/17   1. Mild CAD   2. Thoracic aortic aneurysm without rupture ([place])   3. S/P aortic valve replacement with bioprosthetic valve   4. Chronic diastolic heart failure ([place])   5. Hypertensive heart disease with heart failure ([place])    PLAN:    1. With recent atypical chest pain he undergo myocardial perfusion study.  If he has high risk markers he would benefit from coronary angiography and revascularization.  He will continue treatment with aspirin blocker and high intensity statin 2. Appears stable I will write a copy of the report to vascular surgery [facility] who did not want to intervene for his posterior thoracic aortic aneurysm 3. Stable function clinically on recent echocardiogram 4. Improved no volume overload continue diuretic and sodium restriction 5. Stable continue current treatment including diuretic  beta-blocker   Compliance with diet, lifestyle and medications: *** Past Medical History:  Diagnosis Date  . Aortic valve disorder 06/11/2015  . Arthritis   . Blood transfusion without reported diagnosis   . Carotid artery disease ([place]) 06/11/2015  . Cataract   . CHF (congestive heart failure) ([place])   . Colostomy in place [facility]) 06/11/2015  . Coronary artery disease   . Coronary artery disease involving native coronary artery of native heart without angina pectoris 01/06/2015  . Descending thoracic aortic aneurysm ([place])   . Diabetes mellitus without complication ([place])   . Diverticular disease of left colon   . Emphysema of lung ([place])    as a child  . GERD (gastroesophageal reflux disease)   . HTN (hypertension) 06/11/2015  . Hyperlipidemia 01/06/2015  . Hypertension   . Near syncope 06/10/2015  . Pre-syncope 06/10/2015  . Right hip pain 12/15/2016   Overview:  Added automatically from request for surgery [id number] Overview:  Added automatically from request for surgery [id number]  . S/P aortic valve replacement with bioprosthetic valve 01/06/2015  . Statin intolerance 01/06/2015  . Stroke ([place])    08/01/2015  . Thoracic aortic aneurysm without rupture ([place]) 01/06/2015  . Thoracic ascending aortic aneurysm ([place])   . Thyroid disease    reports nodules  . Type 2 diabetes mellitus with complication ([place]) 12/14/7652    Past Surgical History:  Procedure Laterality Date  . AORTIC VALVE REPAIR    . COLON SURGERY    .  HERNIA REPAIR    . SMALL INTESTINE SURGERY      Current Medications: No outpatient medications have been marked as taking for the 06/27/17 encounter (Appointment) with Richardo Priest, MD.     Allergies:   Penicillins; Vancomycin; and Ramipril   Social History   Socioeconomic History  . Marital status: Widowed    Spouse name: Not on file  . Number of children: Not on file  . Years of education: Not on file  . Highest education level: Not on file  Social Needs  . Financial  resource strain: Not on file  . Food insecurity - worry: Not on file  . Food insecurity - inability: Not on file  . Transportation needs - medical: Not on file  . Transportation needs - non-medical: Not on file  Occupational History  . Not on file  Tobacco Use  . Smoking status: Former Smoker    Types: Cigarettes  . Smokeless tobacco: Never Used  . Tobacco comment: Smoked 2 packs/ week as a teenager   Substance and Sexual Activity  . Alcohol use: No    Alcohol/week: 0.0 oz  . Drug use: No  . Sexual activity: Not on file  Other Topics Concern  . Not on file  Social History Narrative  . Not on file     Family History: The patient's ***family history includes Heart attack in his father. ROS:   Please see the history of present illness.    All other systems reviewed and are negative.  EKGs/Labs/Other Studies Reviewed:    The following studies were reviewed today:  MPI 05/17/16: Study Highlights    Nuclear stress EF: 60%.  There was no ST segment deviation noted during stress.  The study is normal.  This is a low risk study.  The left ventricular ejection fraction is normal (55-65%). Normal pharmacologic nuclear stress test with no evidence for prior infarct or ischemia. Normal LVEF.    Recent Labs:  06/20/17: Cbc normal, CMP normal Cr 1.0 K 3.8  05/06/2017: BUN 17; Creatinine, Ser 0.87; NT-Pro BNP 292; Potassium 3.9; Sodium 141  Recent Lipid Panel No results found for: CHOL, TRIG, HDL, CHOLHDL, VLDL, LDLCALC, LDLDIRECT  Physical Exam:    VS:  There were no vitals taken for this visit.    Wt Readings from Last 3 Encounters:  05/17/17 185 lb (83.9 kg)  05/13/17 185 lb 1.9 oz (84 kg)  05/06/17 187 lb (84.8 kg)     GEN: *** Well nourished, well developed in no acute distress HEENT: Normal NECK: No JVD; No carotid bruits LYMPHATICS: No lymphadenopathy CARDIAC: ***RRR, no murmurs, rubs, gallops RESPIRATORY:  Clear to auscultation without rales, wheezing or  rhonchi  ABDOMEN: Soft, non-tender, non-distended MUSCULOSKELETAL:  No edema; No deformity  SKIN: Warm and dry NEUROLOGIC:  Alert and oriented x 3 PSYCHIATRIC:  Normal affect    Signed, Shirlee More, MD  06/26/2017 6:45 PM    [facility]

## 2017-06-27 ENCOUNTER — Ambulatory Visit: Payer: PPO | Admitting: Cardiology

## 2017-06-27 DIAGNOSIS — R0989 Other specified symptoms and signs involving the circulatory and respiratory systems: Secondary | ICD-10-CM

## 2017-06-28 ENCOUNTER — Encounter: Payer: Self-pay | Admitting: Cardiology

## 2017-07-04 DIAGNOSIS — E113293 Type 2 diabetes mellitus with mild nonproliferative diabetic retinopathy without macular edema, bilateral: Secondary | ICD-10-CM | POA: Diagnosis not present

## 2017-07-04 DIAGNOSIS — H02403 Unspecified ptosis of bilateral eyelids: Secondary | ICD-10-CM | POA: Diagnosis not present

## 2017-07-04 DIAGNOSIS — H2513 Age-related nuclear cataract, bilateral: Secondary | ICD-10-CM | POA: Diagnosis not present

## 2017-07-05 DIAGNOSIS — Z933 Colostomy status: Secondary | ICD-10-CM | POA: Diagnosis not present

## 2017-07-08 DIAGNOSIS — E119 Type 2 diabetes mellitus without complications: Secondary | ICD-10-CM | POA: Diagnosis not present

## 2017-07-13 DIAGNOSIS — C61 Malignant neoplasm of prostate: Secondary | ICD-10-CM | POA: Diagnosis not present

## 2017-08-03 DIAGNOSIS — E1129 Type 2 diabetes mellitus with other diabetic kidney complication: Secondary | ICD-10-CM | POA: Diagnosis not present

## 2017-08-09 DIAGNOSIS — Z136 Encounter for screening for cardiovascular disorders: Secondary | ICD-10-CM | POA: Diagnosis not present

## 2017-08-09 DIAGNOSIS — Z0000 Encounter for general adult medical examination without abnormal findings: Secondary | ICD-10-CM | POA: Diagnosis not present

## 2017-08-09 DIAGNOSIS — Z1331 Encounter for screening for depression: Secondary | ICD-10-CM | POA: Diagnosis not present

## 2017-08-09 DIAGNOSIS — E1129 Type 2 diabetes mellitus with other diabetic kidney complication: Secondary | ICD-10-CM | POA: Diagnosis not present

## 2017-08-09 DIAGNOSIS — Z9181 History of falling: Secondary | ICD-10-CM | POA: Diagnosis not present

## 2017-08-09 DIAGNOSIS — Z125 Encounter for screening for malignant neoplasm of prostate: Secondary | ICD-10-CM | POA: Diagnosis not present

## 2017-08-09 DIAGNOSIS — E785 Hyperlipidemia, unspecified: Secondary | ICD-10-CM | POA: Diagnosis not present

## 2017-08-31 DIAGNOSIS — M9905 Segmental and somatic dysfunction of pelvic region: Secondary | ICD-10-CM | POA: Diagnosis not present

## 2017-08-31 DIAGNOSIS — M9902 Segmental and somatic dysfunction of thoracic region: Secondary | ICD-10-CM | POA: Diagnosis not present

## 2017-08-31 DIAGNOSIS — M545 Low back pain: Secondary | ICD-10-CM | POA: Diagnosis not present

## 2017-08-31 DIAGNOSIS — M9903 Segmental and somatic dysfunction of lumbar region: Secondary | ICD-10-CM | POA: Diagnosis not present

## 2017-09-01 DIAGNOSIS — E1129 Type 2 diabetes mellitus with other diabetic kidney complication: Secondary | ICD-10-CM | POA: Diagnosis not present

## 2017-09-20 DIAGNOSIS — I639 Cerebral infarction, unspecified: Secondary | ICD-10-CM | POA: Diagnosis not present

## 2017-09-20 DIAGNOSIS — Z6828 Body mass index (BMI) 28.0-28.9, adult: Secondary | ICD-10-CM | POA: Diagnosis not present

## 2017-09-20 DIAGNOSIS — I10 Essential (primary) hypertension: Secondary | ICD-10-CM | POA: Diagnosis not present

## 2017-09-20 DIAGNOSIS — E785 Hyperlipidemia, unspecified: Secondary | ICD-10-CM | POA: Diagnosis not present

## 2017-09-20 DIAGNOSIS — R600 Localized edema: Secondary | ICD-10-CM | POA: Diagnosis not present

## 2017-09-20 DIAGNOSIS — D638 Anemia in other chronic diseases classified elsewhere: Secondary | ICD-10-CM | POA: Diagnosis not present

## 2017-09-20 DIAGNOSIS — E1129 Type 2 diabetes mellitus with other diabetic kidney complication: Secondary | ICD-10-CM | POA: Diagnosis not present

## 2017-09-22 DIAGNOSIS — E1129 Type 2 diabetes mellitus with other diabetic kidney complication: Secondary | ICD-10-CM | POA: Diagnosis not present

## 2017-09-22 DIAGNOSIS — E785 Hyperlipidemia, unspecified: Secondary | ICD-10-CM | POA: Diagnosis not present

## 2017-09-22 DIAGNOSIS — D638 Anemia in other chronic diseases classified elsewhere: Secondary | ICD-10-CM | POA: Diagnosis not present

## 2017-10-12 DIAGNOSIS — M1611 Unilateral primary osteoarthritis, right hip: Secondary | ICD-10-CM | POA: Diagnosis not present

## 2017-10-12 DIAGNOSIS — Z01810 Encounter for preprocedural cardiovascular examination: Secondary | ICD-10-CM | POA: Diagnosis not present

## 2017-10-12 DIAGNOSIS — M259 Joint disorder, unspecified: Secondary | ICD-10-CM | POA: Diagnosis not present

## 2017-10-12 DIAGNOSIS — I110 Hypertensive heart disease with heart failure: Secondary | ICD-10-CM | POA: Diagnosis not present

## 2017-10-12 DIAGNOSIS — I2510 Atherosclerotic heart disease of native coronary artery without angina pectoris: Secondary | ICD-10-CM | POA: Diagnosis not present

## 2017-10-12 DIAGNOSIS — I509 Heart failure, unspecified: Secondary | ICD-10-CM | POA: Diagnosis not present

## 2017-10-12 DIAGNOSIS — G894 Chronic pain syndrome: Secondary | ICD-10-CM | POA: Diagnosis not present

## 2017-10-12 DIAGNOSIS — I712 Thoracic aortic aneurysm, without rupture: Secondary | ICD-10-CM | POA: Diagnosis not present

## 2017-10-12 DIAGNOSIS — I4891 Unspecified atrial fibrillation: Secondary | ICD-10-CM | POA: Diagnosis not present

## 2017-10-12 DIAGNOSIS — I951 Orthostatic hypotension: Secondary | ICD-10-CM | POA: Diagnosis not present

## 2017-10-12 DIAGNOSIS — E119 Type 2 diabetes mellitus without complications: Secondary | ICD-10-CM | POA: Diagnosis not present

## 2017-10-12 DIAGNOSIS — Z01818 Encounter for other preprocedural examination: Secondary | ICD-10-CM | POA: Diagnosis not present

## 2017-10-12 DIAGNOSIS — Z01812 Encounter for preprocedural laboratory examination: Secondary | ICD-10-CM | POA: Diagnosis not present

## 2017-10-19 DIAGNOSIS — I2510 Atherosclerotic heart disease of native coronary artery without angina pectoris: Secondary | ICD-10-CM | POA: Diagnosis not present

## 2017-10-19 DIAGNOSIS — E119 Type 2 diabetes mellitus without complications: Secondary | ICD-10-CM | POA: Diagnosis not present

## 2017-10-19 DIAGNOSIS — L0592 Pilonidal sinus without abscess: Secondary | ICD-10-CM | POA: Diagnosis not present

## 2017-10-20 DIAGNOSIS — I2510 Atherosclerotic heart disease of native coronary artery without angina pectoris: Secondary | ICD-10-CM | POA: Diagnosis not present

## 2017-10-20 DIAGNOSIS — Z471 Aftercare following joint replacement surgery: Secondary | ICD-10-CM | POA: Diagnosis not present

## 2017-10-20 DIAGNOSIS — Z794 Long term (current) use of insulin: Secondary | ICD-10-CM | POA: Diagnosis not present

## 2017-10-20 DIAGNOSIS — G894 Chronic pain syndrome: Secondary | ICD-10-CM | POA: Diagnosis not present

## 2017-10-20 DIAGNOSIS — I5030 Unspecified diastolic (congestive) heart failure: Secondary | ICD-10-CM | POA: Diagnosis not present

## 2017-10-20 DIAGNOSIS — M1990 Unspecified osteoarthritis, unspecified site: Secondary | ICD-10-CM | POA: Diagnosis not present

## 2017-10-20 DIAGNOSIS — Z953 Presence of xenogenic heart valve: Secondary | ICD-10-CM | POA: Diagnosis not present

## 2017-10-20 DIAGNOSIS — R05 Cough: Secondary | ICD-10-CM | POA: Diagnosis not present

## 2017-10-20 DIAGNOSIS — Z87891 Personal history of nicotine dependence: Secondary | ICD-10-CM | POA: Diagnosis not present

## 2017-10-20 DIAGNOSIS — N400 Enlarged prostate without lower urinary tract symptoms: Secondary | ICD-10-CM | POA: Diagnosis not present

## 2017-10-20 DIAGNOSIS — Z8679 Personal history of other diseases of the circulatory system: Secondary | ICD-10-CM | POA: Diagnosis not present

## 2017-10-20 DIAGNOSIS — I4891 Unspecified atrial fibrillation: Secondary | ICD-10-CM | POA: Diagnosis not present

## 2017-10-20 DIAGNOSIS — M1611 Unilateral primary osteoarthritis, right hip: Secondary | ICD-10-CM | POA: Diagnosis not present

## 2017-10-20 DIAGNOSIS — I5032 Chronic diastolic (congestive) heart failure: Secondary | ICD-10-CM | POA: Diagnosis not present

## 2017-10-20 DIAGNOSIS — I951 Orthostatic hypotension: Secondary | ICD-10-CM | POA: Diagnosis not present

## 2017-10-20 DIAGNOSIS — E785 Hyperlipidemia, unspecified: Secondary | ICD-10-CM | POA: Diagnosis not present

## 2017-10-20 DIAGNOSIS — I712 Thoracic aortic aneurysm, without rupture: Secondary | ICD-10-CM | POA: Diagnosis not present

## 2017-10-20 DIAGNOSIS — Z96641 Presence of right artificial hip joint: Secondary | ICD-10-CM | POA: Diagnosis not present

## 2017-10-20 DIAGNOSIS — E119 Type 2 diabetes mellitus without complications: Secondary | ICD-10-CM | POA: Diagnosis not present

## 2017-10-20 DIAGNOSIS — Z8673 Personal history of transient ischemic attack (TIA), and cerebral infarction without residual deficits: Secondary | ICD-10-CM | POA: Diagnosis not present

## 2017-10-20 DIAGNOSIS — E118 Type 2 diabetes mellitus with unspecified complications: Secondary | ICD-10-CM | POA: Diagnosis not present

## 2017-10-20 DIAGNOSIS — I110 Hypertensive heart disease with heart failure: Secondary | ICD-10-CM | POA: Diagnosis not present

## 2017-10-20 DIAGNOSIS — Z791 Long term (current) use of non-steroidal anti-inflammatories (NSAID): Secondary | ICD-10-CM | POA: Diagnosis not present

## 2017-10-20 DIAGNOSIS — Z880 Allergy status to penicillin: Secondary | ICD-10-CM | POA: Diagnosis not present

## 2017-10-20 DIAGNOSIS — I10 Essential (primary) hypertension: Secondary | ICD-10-CM | POA: Diagnosis not present

## 2017-10-20 DIAGNOSIS — R0989 Other specified symptoms and signs involving the circulatory and respiratory systems: Secondary | ICD-10-CM | POA: Diagnosis not present

## 2017-10-20 DIAGNOSIS — Z7982 Long term (current) use of aspirin: Secondary | ICD-10-CM | POA: Diagnosis not present

## 2017-10-20 DIAGNOSIS — F05 Delirium due to known physiological condition: Secondary | ICD-10-CM | POA: Diagnosis not present

## 2017-10-20 DIAGNOSIS — Z8546 Personal history of malignant neoplasm of prostate: Secondary | ICD-10-CM | POA: Diagnosis not present

## 2017-10-24 DIAGNOSIS — I110 Hypertensive heart disease with heart failure: Secondary | ICD-10-CM | POA: Diagnosis not present

## 2017-10-24 DIAGNOSIS — E119 Type 2 diabetes mellitus without complications: Secondary | ICD-10-CM | POA: Diagnosis not present

## 2017-10-24 DIAGNOSIS — Z87891 Personal history of nicotine dependence: Secondary | ICD-10-CM | POA: Diagnosis not present

## 2017-10-24 DIAGNOSIS — M1990 Unspecified osteoarthritis, unspecified site: Secondary | ICD-10-CM | POA: Diagnosis not present

## 2017-10-24 DIAGNOSIS — Z96641 Presence of right artificial hip joint: Secondary | ICD-10-CM | POA: Diagnosis not present

## 2017-10-24 DIAGNOSIS — E785 Hyperlipidemia, unspecified: Secondary | ICD-10-CM | POA: Diagnosis not present

## 2017-10-24 DIAGNOSIS — Z794 Long term (current) use of insulin: Secondary | ICD-10-CM | POA: Diagnosis not present

## 2017-10-24 DIAGNOSIS — I5032 Chronic diastolic (congestive) heart failure: Secondary | ICD-10-CM | POA: Diagnosis not present

## 2017-10-24 DIAGNOSIS — R05 Cough: Secondary | ICD-10-CM | POA: Diagnosis not present

## 2017-10-24 DIAGNOSIS — E118 Type 2 diabetes mellitus with unspecified complications: Secondary | ICD-10-CM | POA: Diagnosis not present

## 2017-10-24 DIAGNOSIS — I4891 Unspecified atrial fibrillation: Secondary | ICD-10-CM | POA: Diagnosis not present

## 2017-10-24 DIAGNOSIS — M1611 Unilateral primary osteoarthritis, right hip: Secondary | ICD-10-CM | POA: Diagnosis not present

## 2017-10-24 DIAGNOSIS — Z471 Aftercare following joint replacement surgery: Secondary | ICD-10-CM | POA: Diagnosis not present

## 2017-10-24 DIAGNOSIS — I10 Essential (primary) hypertension: Secondary | ICD-10-CM | POA: Diagnosis not present

## 2017-10-24 DIAGNOSIS — I2510 Atherosclerotic heart disease of native coronary artery without angina pectoris: Secondary | ICD-10-CM | POA: Diagnosis not present

## 2017-10-24 DIAGNOSIS — I712 Thoracic aortic aneurysm, without rupture: Secondary | ICD-10-CM | POA: Diagnosis not present

## 2017-10-24 DIAGNOSIS — I5030 Unspecified diastolic (congestive) heart failure: Secondary | ICD-10-CM | POA: Diagnosis not present

## 2017-10-24 DIAGNOSIS — F05 Delirium due to known physiological condition: Secondary | ICD-10-CM | POA: Diagnosis not present

## 2017-10-25 DIAGNOSIS — M1611 Unilateral primary osteoarthritis, right hip: Secondary | ICD-10-CM | POA: Diagnosis not present

## 2017-10-25 DIAGNOSIS — I5030 Unspecified diastolic (congestive) heart failure: Secondary | ICD-10-CM | POA: Diagnosis not present

## 2017-10-25 DIAGNOSIS — R05 Cough: Secondary | ICD-10-CM | POA: Diagnosis not present

## 2017-10-25 DIAGNOSIS — Z96641 Presence of right artificial hip joint: Secondary | ICD-10-CM | POA: Diagnosis not present

## 2017-11-01 DIAGNOSIS — Z96641 Presence of right artificial hip joint: Secondary | ICD-10-CM | POA: Diagnosis not present

## 2017-11-01 DIAGNOSIS — M1611 Unilateral primary osteoarthritis, right hip: Secondary | ICD-10-CM | POA: Diagnosis not present

## 2017-11-01 DIAGNOSIS — I5030 Unspecified diastolic (congestive) heart failure: Secondary | ICD-10-CM | POA: Diagnosis not present

## 2017-11-01 DIAGNOSIS — R05 Cough: Secondary | ICD-10-CM | POA: Diagnosis not present

## 2017-11-02 DIAGNOSIS — Z471 Aftercare following joint replacement surgery: Secondary | ICD-10-CM | POA: Diagnosis not present

## 2017-11-02 DIAGNOSIS — M1611 Unilateral primary osteoarthritis, right hip: Secondary | ICD-10-CM | POA: Diagnosis not present

## 2017-11-02 DIAGNOSIS — R05 Cough: Secondary | ICD-10-CM | POA: Diagnosis not present

## 2017-11-02 DIAGNOSIS — Z96641 Presence of right artificial hip joint: Secondary | ICD-10-CM | POA: Diagnosis not present

## 2017-11-02 DIAGNOSIS — I5030 Unspecified diastolic (congestive) heart failure: Secondary | ICD-10-CM | POA: Diagnosis not present

## 2017-11-03 DIAGNOSIS — Z8673 Personal history of transient ischemic attack (TIA), and cerebral infarction without residual deficits: Secondary | ICD-10-CM | POA: Diagnosis not present

## 2017-11-03 DIAGNOSIS — Z933 Colostomy status: Secondary | ICD-10-CM | POA: Diagnosis not present

## 2017-11-03 DIAGNOSIS — I110 Hypertensive heart disease with heart failure: Secondary | ICD-10-CM | POA: Diagnosis not present

## 2017-11-03 DIAGNOSIS — N400 Enlarged prostate without lower urinary tract symptoms: Secondary | ICD-10-CM | POA: Diagnosis not present

## 2017-11-03 DIAGNOSIS — E119 Type 2 diabetes mellitus without complications: Secondary | ICD-10-CM | POA: Diagnosis not present

## 2017-11-03 DIAGNOSIS — I712 Thoracic aortic aneurysm, without rupture: Secondary | ICD-10-CM | POA: Diagnosis not present

## 2017-11-03 DIAGNOSIS — I2510 Atherosclerotic heart disease of native coronary artery without angina pectoris: Secondary | ICD-10-CM | POA: Diagnosis not present

## 2017-11-03 DIAGNOSIS — I350 Nonrheumatic aortic (valve) stenosis: Secondary | ICD-10-CM | POA: Diagnosis not present

## 2017-11-03 DIAGNOSIS — M1991 Primary osteoarthritis, unspecified site: Secondary | ICD-10-CM | POA: Diagnosis not present

## 2017-11-03 DIAGNOSIS — Z7984 Long term (current) use of oral hypoglycemic drugs: Secondary | ICD-10-CM | POA: Diagnosis not present

## 2017-11-03 DIAGNOSIS — Z602 Problems related to living alone: Secondary | ICD-10-CM | POA: Diagnosis not present

## 2017-11-03 DIAGNOSIS — Z8546 Personal history of malignant neoplasm of prostate: Secondary | ICD-10-CM | POA: Diagnosis not present

## 2017-11-03 DIAGNOSIS — Z791 Long term (current) use of non-steroidal anti-inflammatories (NSAID): Secondary | ICD-10-CM | POA: Diagnosis not present

## 2017-11-03 DIAGNOSIS — Z7982 Long term (current) use of aspirin: Secondary | ICD-10-CM | POA: Diagnosis not present

## 2017-11-03 DIAGNOSIS — Z471 Aftercare following joint replacement surgery: Secondary | ICD-10-CM | POA: Diagnosis not present

## 2017-11-03 DIAGNOSIS — I5030 Unspecified diastolic (congestive) heart failure: Secondary | ICD-10-CM | POA: Diagnosis not present

## 2017-11-03 DIAGNOSIS — Z96641 Presence of right artificial hip joint: Secondary | ICD-10-CM | POA: Diagnosis not present

## 2017-11-03 DIAGNOSIS — Z952 Presence of prosthetic heart valve: Secondary | ICD-10-CM | POA: Diagnosis not present

## 2017-11-07 DIAGNOSIS — Z933 Colostomy status: Secondary | ICD-10-CM | POA: Diagnosis not present

## 2017-11-09 DIAGNOSIS — M1611 Unilateral primary osteoarthritis, right hip: Secondary | ICD-10-CM | POA: Diagnosis not present

## 2017-11-09 DIAGNOSIS — E1129 Type 2 diabetes mellitus with other diabetic kidney complication: Secondary | ICD-10-CM | POA: Diagnosis not present

## 2017-11-09 DIAGNOSIS — Z1339 Encounter for screening examination for other mental health and behavioral disorders: Secondary | ICD-10-CM | POA: Diagnosis not present

## 2017-11-09 DIAGNOSIS — J208 Acute bronchitis due to other specified organisms: Secondary | ICD-10-CM | POA: Diagnosis not present

## 2017-11-15 ENCOUNTER — Other Ambulatory Visit: Payer: Self-pay | Admitting: *Deleted

## 2017-11-15 NOTE — Patient Outreach (Signed)
[facility]) Care Management  11/15/2017  DAVIN ARCHULETTA [birth year] [id number]  Referral via [place]; member discharged from [facility] 11/02/2017.  :"TOC will be completed by primary care provider office who will refer to [facility] care management if needed."  Plan: Case closure.  Sherrin Daisy, RN BSN [place] Management Coordinator [facility] Care Management  [phone]

## 2017-11-17 DIAGNOSIS — R531 Weakness: Secondary | ICD-10-CM | POA: Diagnosis not present

## 2017-11-17 DIAGNOSIS — Z96641 Presence of right artificial hip joint: Secondary | ICD-10-CM | POA: Diagnosis not present

## 2017-11-17 DIAGNOSIS — R262 Difficulty in walking, not elsewhere classified: Secondary | ICD-10-CM | POA: Diagnosis not present

## 2017-11-23 DIAGNOSIS — N3289 Other specified disorders of bladder: Secondary | ICD-10-CM | POA: Diagnosis not present

## 2017-11-23 DIAGNOSIS — Z7689 Persons encountering health services in other specified circumstances: Secondary | ICD-10-CM | POA: Diagnosis not present

## 2017-11-23 DIAGNOSIS — C61 Malignant neoplasm of prostate: Secondary | ICD-10-CM | POA: Diagnosis not present

## 2017-11-28 ENCOUNTER — Telehealth: Payer: Self-pay

## 2017-11-28 DIAGNOSIS — I712 Thoracic aortic aneurysm, without rupture: Secondary | ICD-10-CM | POA: Diagnosis not present

## 2017-11-28 DIAGNOSIS — I2510 Atherosclerotic heart disease of native coronary artery without angina pectoris: Secondary | ICD-10-CM | POA: Diagnosis not present

## 2017-11-28 DIAGNOSIS — I5032 Chronic diastolic (congestive) heart failure: Secondary | ICD-10-CM | POA: Diagnosis not present

## 2017-11-28 NOTE — Telephone Encounter (Signed)
-----   Message from Isaiah Blakes sent at 11/28/2017 11:48 AM EDT ----- Pt cancelled upcoming office visit/states he's switching back to [facility] office-didn't know if there was some way to note that in the chart

## 2017-11-28 NOTE — Telephone Encounter (Signed)
Noted  

## 2017-12-07 DIAGNOSIS — Z471 Aftercare following joint replacement surgery: Secondary | ICD-10-CM | POA: Diagnosis not present

## 2017-12-07 DIAGNOSIS — Z09 Encounter for follow-up examination after completed treatment for conditions other than malignant neoplasm: Secondary | ICD-10-CM | POA: Diagnosis not present

## 2017-12-07 DIAGNOSIS — Z96641 Presence of right artificial hip joint: Secondary | ICD-10-CM | POA: Diagnosis not present

## 2017-12-20 ENCOUNTER — Ambulatory Visit: Payer: PPO | Admitting: Cardiology

## 2017-12-26 DIAGNOSIS — E785 Hyperlipidemia, unspecified: Secondary | ICD-10-CM | POA: Diagnosis not present

## 2017-12-26 DIAGNOSIS — I10 Essential (primary) hypertension: Secondary | ICD-10-CM | POA: Diagnosis not present

## 2017-12-26 DIAGNOSIS — D638 Anemia in other chronic diseases classified elsewhere: Secondary | ICD-10-CM | POA: Diagnosis not present

## 2017-12-26 DIAGNOSIS — I639 Cerebral infarction, unspecified: Secondary | ICD-10-CM | POA: Diagnosis not present

## 2017-12-26 DIAGNOSIS — E1129 Type 2 diabetes mellitus with other diabetic kidney complication: Secondary | ICD-10-CM | POA: Diagnosis not present

## 2017-12-26 DIAGNOSIS — Z6826 Body mass index (BMI) 26.0-26.9, adult: Secondary | ICD-10-CM | POA: Diagnosis not present

## 2018-01-09 DIAGNOSIS — Z9109 Other allergy status, other than to drugs and biological substances: Secondary | ICD-10-CM | POA: Diagnosis not present

## 2018-01-09 DIAGNOSIS — Z96641 Presence of right artificial hip joint: Secondary | ICD-10-CM | POA: Diagnosis not present

## 2018-01-09 DIAGNOSIS — Z7982 Long term (current) use of aspirin: Secondary | ICD-10-CM | POA: Diagnosis not present

## 2018-01-09 DIAGNOSIS — Z880 Allergy status to penicillin: Secondary | ICD-10-CM | POA: Diagnosis not present

## 2018-01-09 DIAGNOSIS — I712 Thoracic aortic aneurysm, without rupture: Secondary | ICD-10-CM | POA: Diagnosis not present

## 2018-01-09 DIAGNOSIS — M7989 Other specified soft tissue disorders: Secondary | ICD-10-CM | POA: Diagnosis not present

## 2018-01-09 DIAGNOSIS — E785 Hyperlipidemia, unspecified: Secondary | ICD-10-CM | POA: Diagnosis not present

## 2018-01-09 DIAGNOSIS — Z8546 Personal history of malignant neoplasm of prostate: Secondary | ICD-10-CM | POA: Diagnosis not present

## 2018-01-09 DIAGNOSIS — I361 Nonrheumatic tricuspid (valve) insufficiency: Secondary | ICD-10-CM | POA: Diagnosis not present

## 2018-01-09 DIAGNOSIS — R55 Syncope and collapse: Secondary | ICD-10-CM | POA: Diagnosis not present

## 2018-01-09 DIAGNOSIS — R931 Abnormal findings on diagnostic imaging of heart and coronary circulation: Secondary | ICD-10-CM | POA: Diagnosis not present

## 2018-01-09 DIAGNOSIS — I2510 Atherosclerotic heart disease of native coronary artery without angina pectoris: Secondary | ICD-10-CM | POA: Diagnosis not present

## 2018-01-09 DIAGNOSIS — G4700 Insomnia, unspecified: Secondary | ICD-10-CM | POA: Diagnosis not present

## 2018-01-09 DIAGNOSIS — Z952 Presence of prosthetic heart valve: Secondary | ICD-10-CM | POA: Diagnosis not present

## 2018-01-09 DIAGNOSIS — I340 Nonrheumatic mitral (valve) insufficiency: Secondary | ICD-10-CM | POA: Diagnosis not present

## 2018-01-09 DIAGNOSIS — I517 Cardiomegaly: Secondary | ICD-10-CM | POA: Diagnosis not present

## 2018-01-09 DIAGNOSIS — I5032 Chronic diastolic (congestive) heart failure: Secondary | ICD-10-CM | POA: Diagnosis not present

## 2018-01-09 DIAGNOSIS — E119 Type 2 diabetes mellitus without complications: Secondary | ICD-10-CM | POA: Diagnosis not present

## 2018-01-25 DIAGNOSIS — E119 Type 2 diabetes mellitus without complications: Secondary | ICD-10-CM | POA: Diagnosis not present

## 2018-02-12 DIAGNOSIS — I6350 Cerebral infarction due to unspecified occlusion or stenosis of unspecified cerebral artery: Secondary | ICD-10-CM | POA: Diagnosis not present

## 2018-02-12 DIAGNOSIS — I10 Essential (primary) hypertension: Secondary | ICD-10-CM

## 2018-02-12 DIAGNOSIS — E119 Type 2 diabetes mellitus without complications: Secondary | ICD-10-CM

## 2018-02-12 DIAGNOSIS — R531 Weakness: Secondary | ICD-10-CM | POA: Diagnosis not present

## 2018-02-12 DIAGNOSIS — I6509 Occlusion and stenosis of unspecified vertebral artery: Secondary | ICD-10-CM

## 2018-02-12 DIAGNOSIS — I712 Thoracic aortic aneurysm, without rupture: Secondary | ICD-10-CM | POA: Diagnosis not present

## 2018-02-12 DIAGNOSIS — Z952 Presence of prosthetic heart valve: Secondary | ICD-10-CM

## 2018-02-12 DIAGNOSIS — I2510 Atherosclerotic heart disease of native coronary artery without angina pectoris: Secondary | ICD-10-CM

## 2018-02-12 DIAGNOSIS — I2699 Other pulmonary embolism without acute cor pulmonale: Secondary | ICD-10-CM | POA: Diagnosis not present

## 2018-02-12 DIAGNOSIS — R55 Syncope and collapse: Secondary | ICD-10-CM | POA: Diagnosis not present

## 2018-02-13 DIAGNOSIS — Z888 Allergy status to other drugs, medicaments and biological substances status: Secondary | ICD-10-CM | POA: Diagnosis not present

## 2018-02-13 DIAGNOSIS — R55 Syncope and collapse: Secondary | ICD-10-CM | POA: Diagnosis not present

## 2018-02-13 DIAGNOSIS — I712 Thoracic aortic aneurysm, without rupture: Secondary | ICD-10-CM | POA: Diagnosis not present

## 2018-02-13 DIAGNOSIS — Z880 Allergy status to penicillin: Secondary | ICD-10-CM | POA: Diagnosis not present

## 2018-02-13 DIAGNOSIS — I6509 Occlusion and stenosis of unspecified vertebral artery: Secondary | ICD-10-CM | POA: Diagnosis not present

## 2018-02-13 DIAGNOSIS — R29700 NIHSS score 0: Secondary | ICD-10-CM | POA: Diagnosis not present

## 2018-02-13 DIAGNOSIS — I5032 Chronic diastolic (congestive) heart failure: Secondary | ICD-10-CM | POA: Diagnosis not present

## 2018-02-13 DIAGNOSIS — E11649 Type 2 diabetes mellitus with hypoglycemia without coma: Secondary | ICD-10-CM | POA: Diagnosis not present

## 2018-02-13 DIAGNOSIS — Z952 Presence of prosthetic heart valve: Secondary | ICD-10-CM | POA: Diagnosis not present

## 2018-02-13 DIAGNOSIS — I10 Essential (primary) hypertension: Secondary | ICD-10-CM | POA: Diagnosis not present

## 2018-02-13 DIAGNOSIS — I2510 Atherosclerotic heart disease of native coronary artery without angina pectoris: Secondary | ICD-10-CM | POA: Diagnosis not present

## 2018-02-13 DIAGNOSIS — I350 Nonrheumatic aortic (valve) stenosis: Secondary | ICD-10-CM

## 2018-02-13 DIAGNOSIS — E876 Hypokalemia: Secondary | ICD-10-CM | POA: Diagnosis not present

## 2018-02-13 DIAGNOSIS — Z87891 Personal history of nicotine dependence: Secondary | ICD-10-CM | POA: Diagnosis not present

## 2018-02-13 DIAGNOSIS — M1990 Unspecified osteoarthritis, unspecified site: Secondary | ICD-10-CM | POA: Diagnosis not present

## 2018-02-13 DIAGNOSIS — I951 Orthostatic hypotension: Secondary | ICD-10-CM | POA: Diagnosis not present

## 2018-02-13 DIAGNOSIS — E1142 Type 2 diabetes mellitus with diabetic polyneuropathy: Secondary | ICD-10-CM | POA: Diagnosis not present

## 2018-02-13 DIAGNOSIS — I6502 Occlusion and stenosis of left vertebral artery: Secondary | ICD-10-CM | POA: Diagnosis not present

## 2018-02-13 DIAGNOSIS — Z881 Allergy status to other antibiotic agents status: Secondary | ICD-10-CM | POA: Diagnosis not present

## 2018-02-13 DIAGNOSIS — E1165 Type 2 diabetes mellitus with hyperglycemia: Secondary | ICD-10-CM | POA: Diagnosis not present

## 2018-02-13 DIAGNOSIS — I2699 Other pulmonary embolism without acute cor pulmonale: Secondary | ICD-10-CM | POA: Diagnosis not present

## 2018-02-13 DIAGNOSIS — Z7982 Long term (current) use of aspirin: Secondary | ICD-10-CM | POA: Diagnosis not present

## 2018-02-13 DIAGNOSIS — I639 Cerebral infarction, unspecified: Secondary | ICD-10-CM | POA: Diagnosis not present

## 2018-02-13 DIAGNOSIS — Z8673 Personal history of transient ischemic attack (TIA), and cerebral infarction without residual deficits: Secondary | ICD-10-CM | POA: Diagnosis not present

## 2018-02-13 DIAGNOSIS — E860 Dehydration: Secondary | ICD-10-CM | POA: Diagnosis not present

## 2018-02-13 DIAGNOSIS — R531 Weakness: Secondary | ICD-10-CM | POA: Diagnosis not present

## 2018-02-13 DIAGNOSIS — Z79899 Other long term (current) drug therapy: Secondary | ICD-10-CM | POA: Diagnosis not present

## 2018-02-13 DIAGNOSIS — E119 Type 2 diabetes mellitus without complications: Secondary | ICD-10-CM | POA: Diagnosis not present

## 2018-02-13 DIAGNOSIS — I361 Nonrheumatic tricuspid (valve) insufficiency: Secondary | ICD-10-CM

## 2018-02-13 DIAGNOSIS — E785 Hyperlipidemia, unspecified: Secondary | ICD-10-CM | POA: Diagnosis not present

## 2018-02-13 DIAGNOSIS — I110 Hypertensive heart disease with heart failure: Secondary | ICD-10-CM | POA: Diagnosis not present

## 2018-02-13 DIAGNOSIS — I517 Cardiomegaly: Secondary | ICD-10-CM

## 2018-02-15 DIAGNOSIS — I10 Essential (primary) hypertension: Secondary | ICD-10-CM | POA: Diagnosis not present

## 2018-02-15 DIAGNOSIS — I639 Cerebral infarction, unspecified: Secondary | ICD-10-CM | POA: Diagnosis not present

## 2018-02-15 DIAGNOSIS — I2699 Other pulmonary embolism without acute cor pulmonale: Secondary | ICD-10-CM | POA: Diagnosis not present

## 2018-02-16 ENCOUNTER — Other Ambulatory Visit: Payer: Self-pay

## 2018-02-16 NOTE — Patient Outreach (Signed)
[facility]) Care Management  02/16/2018  Jonathan Ryan 1929-03-27 [id number]   Patient discharged from an inpatient admission from [facility] on 02/14/18.   Referral received. No outreach warranted at this time. Transition of Care  will be completed by primary care provider office who will refer to [name] care management if needed.  Plan: RN CM will close case.  Jone Baseman, RN, MSN [place] Management Care Management Coordinator Direct Line [phone] Cell [phone] Toll Free: [phone]  Fax: [phone]

## 2018-02-20 DIAGNOSIS — H8113 Benign paroxysmal vertigo, bilateral: Secondary | ICD-10-CM | POA: Diagnosis not present

## 2018-02-20 DIAGNOSIS — Z6826 Body mass index (BMI) 26.0-26.9, adult: Secondary | ICD-10-CM | POA: Diagnosis not present

## 2018-03-02 DIAGNOSIS — H903 Sensorineural hearing loss, bilateral: Secondary | ICD-10-CM | POA: Diagnosis not present

## 2018-03-02 DIAGNOSIS — J342 Deviated nasal septum: Secondary | ICD-10-CM | POA: Diagnosis not present

## 2018-03-02 DIAGNOSIS — E041 Nontoxic single thyroid nodule: Secondary | ICD-10-CM | POA: Diagnosis not present

## 2018-03-02 DIAGNOSIS — Z87898 Personal history of other specified conditions: Secondary | ICD-10-CM | POA: Diagnosis not present

## 2018-03-02 DIAGNOSIS — H9319 Tinnitus, unspecified ear: Secondary | ICD-10-CM | POA: Diagnosis not present

## 2018-03-02 DIAGNOSIS — I639 Cerebral infarction, unspecified: Secondary | ICD-10-CM | POA: Diagnosis not present

## 2018-03-16 ENCOUNTER — Ambulatory Visit (INDEPENDENT_AMBULATORY_CARE_PROVIDER_SITE_OTHER): Payer: PPO | Admitting: Neurology

## 2018-03-16 VITALS — BP 141/75 | HR 75 | Ht 66.0 in | Wt 179.0 lb

## 2018-03-16 DIAGNOSIS — R42 Dizziness and giddiness: Secondary | ICD-10-CM

## 2018-03-16 DIAGNOSIS — G629 Polyneuropathy, unspecified: Secondary | ICD-10-CM

## 2018-03-16 NOTE — Patient Instructions (Signed)
I had a long discussion with the patient and his girlfriend regarding his dizziness which is longstanding but appears to have gotten worse recently following hospital admission for hypotension and presyncope in the setting of pulmonary embolism and small left cerebellar infarct.  He also has diabetic neuropathy and spinal stenosis and history of prior stroke and cerebrovascular disease which could contribute to his dizziness.  I advised him to do orthostatic tolerance exercises and to walk with a cane at all times and avoid sudden movements.  Check EMG nerve conduction study.  We also discussed fall and safety precautions.  Continue Eliquis for his pulmonary embolism as well as stroke prevention and maintain strict control of hypertension with blood pressure goal below 130/90, lipids with LDL cholesterol goal below 70 mg percent and diabetes with hemoglobin A1c goal below 6.5.  Eat a healthy diet and be active. He will return for follow-up in the future in 3 months with my nurse practitioner Janett Billow or call earlier if necessary.  Dizziness Dizziness is a common problem. It makes you feel unsteady or light-headed. You may feel like you are about to pass out (faint). Dizziness can lead to getting hurt if you stumble or fall. Dizziness can be caused by many things, including:  Medicines.  Not having enough water in your body (dehydration).  Illness.  Follow these instructions at home: Eating and drinking  Drink enough fluid to keep your pee (urine) clear or pale yellow. This helps to keep you from getting dehydrated. Try to drink more clear fluids, such as water.  Do not drink alcohol.  Limit how much caffeine you drink or eat, if your doctor tells you to do that.  Limit how much salt (sodium) you drink or eat, if your doctor tells you to do that. Activity  Avoid making quick movements. ? When you stand up from sitting in a chair, steady yourself until you feel okay. ? In the morning, first sit  up on the side of the bed. When you feel okay, stand slowly while you hold onto something. Do this until you know that your balance is fine.  If you need to stand in one place for a long time, move your legs often. Tighten and relax the muscles in your legs while you are standing.  Do not drive or use heavy machinery if you feel dizzy.  Avoid bending down if you feel dizzy. Place items in your home so you can reach them easily without leaning over. Lifestyle  Do not use any products that contain nicotine or tobacco, such as cigarettes and e-cigarettes. If you need help quitting, ask your doctor.  Try to lower your stress level. You can do this by using methods such as yoga or meditation. Talk with your doctor if you need help. General instructions  Watch your dizziness for any changes.  Take over-the-counter and prescription medicines only as told by your doctor. Talk with your doctor if you think that you are dizzy because of a medicine that you are taking.  Tell a friend or a family member that you are feeling dizzy. If he or she notices any changes in your behavior, have this person call your doctor.  Keep all follow-up visits as told by your doctor. This is important. Contact a doctor if:  Your dizziness does not go away.  Your dizziness or light-headedness gets worse.  You feel sick to your stomach (nauseous).  You have trouble hearing.  You have new symptoms.  You are  unsteady on your feet.  You feel like the room is spinning. Get help right away if:  You throw up (vomit) or have watery poop (diarrhea), and you cannot eat or drink anything.  You have trouble: ? Talking. ? Walking. ? Swallowing. ? Using your arms, hands, or legs.  You feel generally weak.  You are not thinking clearly, or you have trouble forming sentences. A friend or family member may notice this.  You have: ? Chest pain. ? Pain in your belly (abdomen). ? Shortness of  breath. ? Sweating.  Your vision changes.  You are bleeding.  You have a very bad headache.  You have neck pain or a stiff neck.  You have a fever. These symptoms may be an emergency. Do not wait to see if the symptoms will go away. Get medical help right away. Call your local emergency services (911 in the [place] Do not drive yourself to the hospital. Summary  Dizziness makes you feel unsteady or light-headed. You may feel like you are about to pass out (faint).  Drink enough fluid to keep your pee (urine) clear or pale yellow. Do not drink alcohol.  Avoid making quick movements if you feel dizzy.  Watch your dizziness for any changes. This information is not intended to replace advice given to you by your health care provider. Make sure you discuss any questions you have with your health care provider. Document Released: 03/18/2011 Document Revised: 04/15/2016 Document Reviewed: 04/15/2016 Elsevier Interactive Patient Education  2017 [employer].

## 2018-03-16 NOTE — Progress Notes (Signed)
[facility] [address] [place]. [place] [address] [phone]       OFFICE CONSULT NOTE  Mr. AUSTIN HERD Date of Birth:  18-Jan-1929 Medical Record Number:  [id number]   Referring MD:   Erin Hearing, NP Reason for Referral:  dizziness  HPI: Jonathan Ryan is seen today for initial office consultation visit.  He is accompanied by his girlfriend.  History is obtained from them and review of electronic medical records and provided medical records.  I have personally reviewed imaging films in PACS.  He was admitted a month ago to [facility] with episodes of lightheadedness dizziness and near fainting episodes.  These episodes began in church when he almost passed out twice and once at the restaurant and 1 at home.  His symptoms appear to be worse when he was moving from lying down to sitting or standing position.  He describes them as feeling of near passing out lightheadedness and blurred vision.  When he checked his blood pressure at home it was low at 70/40.  He was seen in the ER.  He was found to have orthostatic hypotension.  However CT angiogram of the chest showed single left lower pulmonary artery embolus.  There was a 5.2 cm aneurysm of the descending thoracic aorta.  CT scan of the brain showed no acute abnormality.  Patient was started on therapeutic Lovenox.  Repeat orthostatic vital signs showed orthostasis.  He was given gentle fluid hydration.  Echocardiogram showed no significant abnormality.  MRI scan of the brain was obtained which showed a small left acute cerebellar infarct.  Telemetry neurology was consulted who recommended to continue Eliquis.  CT angiogram of the neck showed moderate atheromatous narrowing at origin of left and mild at right side which was unchanged from 08/02/15.  Patient states is done well since discharge.  He istolerating Eliquis without bleeding or bruising.  His blood pressure today is 141/75 and his orthostasis symptoms appear to be  improving.  He is learned to get up slowly.  He does have a remote history of stroke 2 years ago when he had developed diplopia.  At that time he was found to have terminal right vertebral artery occlusion.  He did well from that stroke and made full recovery.  He does complain of mild tingling and numbness in his toes and feet mostly at night.  He does have spinal stenosis and complains of some soreness in his back but denies any radicular pain.  He feels his balance is off at times but he is and is leaning to one side but is able to hold on and he has not had any falls or injuries.  ROS:   14 system review of systems is positive for weakness, dizziness, imbalance, numbness and all other systems negative PMH:  Past Medical History:  Diagnosis Date  . Aortic valve disorder 06/11/2015  . Arthritis   . Blood transfusion without reported diagnosis   . CAD (coronary artery disease)   . Carotid artery disease ([place]) 06/11/2015  . Cataract   . CHF (congestive heart failure) ([place])   . Colostomy in place [facility]) 06/11/2015  . Coronary artery disease   . Coronary artery disease involving native coronary artery of native heart without angina pectoris 01/06/2015  . Descending thoracic aortic aneurysm ([place])   . Diabetes mellitus without complication ([place])   . Diverticular disease of left colon   . Emphysema of lung ([place])    84  . GERD (  gastroesophageal reflux disease)   . HTN (hypertension) 06/11/2015  . Hyperlipidemia 01/06/2015  . Hypertension   . Near syncope 06/10/2015  . Pre-syncope 06/10/2015  . Pulmonary embolism ([place])   . Right hip pain 12/15/2016   Overview:  Added automatically from request for surgery [id number] Overview:  Added automatically from request for surgery [id number]  . S/P aortic valve replacement with bioprosthetic valve 01/06/2015  . Statin intolerance 01/06/2015  . Stroke ([place])    08/01/2015  . Thoracic aortic aneurysm without rupture ([place]) 01/06/2015  . Thoracic ascending aortic  aneurysm ([place])   . Thyroid disease    reports nodules  . Type 2 diabetes mellitus with complication ([place]) 07/14/1538  . Vertebral artery stenosis     Social History:  Social History   Socioeconomic History  . Marital status: Widowed    Spouse name: Not on file  . Number of children: Not on file  . Years of education: Not on file  . Highest education level: Not on file  Occupational History  . Not on file  Social Needs  . Financial resource strain: Not on file  . Food insecurity:    Worry: Not on file    Inability: Not on file  . Transportation needs:    Medical: Not on file    Non-medical: Not on file  Tobacco Use  . Smoking status: Former Smoker    Types: Cigarettes  . Smokeless tobacco: Never Used  . Tobacco comment: Smoked 2 packs/ week as a teenager   Substance and Sexual Activity  . Alcohol use: No    Alcohol/week: 0.0 standard drinks  . Drug use: No  . Sexual activity: Not on file  Lifestyle  . Physical activity:    Days per week: Not on file    Minutes per session: Not on file  . Stress: Not on file  Relationships  . Social connections:    Talks on phone: Not on file    Gets together: Not on file    Attends religious service: Not on file    Active member of club or organization: Not on file    Attends meetings of clubs or organizations: Not on file    Relationship status: Not on file  . Intimate partner violence:    Fear of current or ex partner: Not on file    Emotionally abused: Not on file    Physically abused: Not on file    Forced sexual activity: Not on file  Other Topics Concern  . Not on file  Social History Narrative  . Not on file    Medications:   Current Outpatient Medications on File Prior to Visit  Medication Sig Dispense Refill  . Apixaban (ELIQUIS PO) Take 5 mg by mouth 2 (two) times daily.     Marland Kitchen atorvastatin (LIPITOR) 20 MG tablet Take 20 mg by mouth daily.     . Cholecalciferol (VITAMIN D3) 1000 units CAPS Take 1 capsule by mouth  daily.    Marland Kitchen Co-Enzyme Q-10 30 MG CAPS Take by mouth.    . finasteride (PROSCAR) 5 MG tablet Take 5 mg by mouth daily.    . furosemide (LASIX) 40 MG tablet Take 20 mg by mouth daily.     . insulin detemir (LEVEMIR) 100 UNIT/ML injection Inject 25 Units into the skin at bedtime.     . metFORMIN (GLUCOPHAGE) 1000 MG tablet Take by mouth.    . Multiple Vitamin (MULTI-VITAMINS) TABS Take 2 tablets by mouth daily.    Marland Kitchen  zinc gluconate 50 MG tablet Take 50 mg by mouth daily.     No current facility-administered medications on file prior to visit.     Allergies:   Allergies  Allergen Reactions  . Penicillins Anaphylaxis  . Ramipril Rash and Anaphylaxis  . Vancomycin Anaphylaxis  . Oxycodone Other (See Comments)    Pt had confusion post op surgery and diaphoretic after this med     Physical Exam General: well developed, well nourished elderly Caucasian male, seated, in no evident distress Head: head normocephalic and atraumatic.   Neck: supple with soft bilateral carotid bruits Cardiovascular: regular rate and rhythm, soft ejection murmur Musculoskeletal: no deformity Skin:  no rash/petichiae Vascular:  Normal pulses all extremities  Neurologic Exam Mental Status: Awake and fully alert. Oriented to place and time. Recent and remote memory intact. Attention span, concentration and fund of knowledge appropriate. Mood and affect appropriate.  Cranial Nerves: Fundoscopic exam reveals sharp disc margins. Pupils equal, briskly reactive to light. Extraocular movements full without nystagmus. Visual fields full to confrontation. Hearing diminished bilaterallyFacial sensation intact. Face, tongue, palate moves normally and symmetrically.  Motor: Normal bulk and tone. Normal strength in all tested extremity muscles. Sensory.: intact to touch , pinprick , position except  vibratory sensation. Is diminished bilaterally  Coordination: Rapid alternating movements normal in all extremities. Finger-to-nose  and heel-to-shin performed accurately bilaterally. Gait and Station: Arises from chair without difficulty. Stance is broad based Gait is cautious and slow but demonstrates normal stride length and balance . Able to heel, toe and tandem walk without difficulty.  Reflexes: 1+ and symmetric except ankle jerks are depressed. Toes downgoing.      ASSESSMENT: 84 year old male with dizziness and gait imbalance which is likely multifactorial due to combination of underlying diabetic neuropathy, remote stroke, orthostasis and spinal stenosis.    PLAN: I had a long discussion with the patient and his girlfriend regarding his dizziness which is longstanding but appears to have gotten worse recently following hospital admission for hypotension and presyncope in the setting of pulmonary embolism and small left cerebellar infarct.  He also has diabetic neuropathy and spinal stenosis and history of prior stroke and cerebrovascular disease which could contribute to his dizziness.  I advised him to do orthostatic tolerance exercises and to walk with a cane at all times and avoid sudden movements.  Check EMG nerve conduction study.  We also discussed fall and safety precautions.  Continue Eliquis for his pulmonary embolism as well as stroke prevention and maintain strict control of hypertension with blood pressure goal below 130/90, lipids with LDL cholesterol goal below 70 mg percent and diabetes with hemoglobin A1c goal below 6.5.  Eat a healthy diet and be active.  Greater than 50% time during this 45-minute consultation visit was spent on counseling and coordination of care about his dizziness and gait difficulty and answering questions he will return for follow-up in the future in 3 months with my nurse practitioner Janett Billow or call earlier if necessary.  Antony Contras, MD  [facility] [address] [place], [place] [address]  Phone [phone] Fax [phone] Note: This  document was prepared with digital dictation and possible smart phrase technology. Any transcriptional errors that result from this process are unintentional.

## 2018-03-27 DIAGNOSIS — C61 Malignant neoplasm of prostate: Secondary | ICD-10-CM | POA: Diagnosis not present

## 2018-03-27 DIAGNOSIS — N3289 Other specified disorders of bladder: Secondary | ICD-10-CM | POA: Diagnosis not present

## 2018-03-31 DIAGNOSIS — E1129 Type 2 diabetes mellitus with other diabetic kidney complication: Secondary | ICD-10-CM | POA: Diagnosis not present

## 2018-03-31 DIAGNOSIS — I639 Cerebral infarction, unspecified: Secondary | ICD-10-CM | POA: Diagnosis not present

## 2018-03-31 DIAGNOSIS — I2699 Other pulmonary embolism without acute cor pulmonale: Secondary | ICD-10-CM | POA: Diagnosis not present

## 2018-03-31 DIAGNOSIS — I10 Essential (primary) hypertension: Secondary | ICD-10-CM | POA: Diagnosis not present

## 2018-03-31 DIAGNOSIS — E785 Hyperlipidemia, unspecified: Secondary | ICD-10-CM | POA: Diagnosis not present

## 2018-03-31 DIAGNOSIS — D638 Anemia in other chronic diseases classified elsewhere: Secondary | ICD-10-CM | POA: Diagnosis not present

## 2018-04-18 DIAGNOSIS — E041 Nontoxic single thyroid nodule: Secondary | ICD-10-CM | POA: Diagnosis not present

## 2018-04-18 DIAGNOSIS — E042 Nontoxic multinodular goiter: Secondary | ICD-10-CM | POA: Diagnosis not present

## 2018-04-24 DIAGNOSIS — E041 Nontoxic single thyroid nodule: Secondary | ICD-10-CM | POA: Diagnosis not present

## 2018-04-24 DIAGNOSIS — J342 Deviated nasal septum: Secondary | ICD-10-CM | POA: Diagnosis not present

## 2018-05-04 ENCOUNTER — Encounter (INDEPENDENT_AMBULATORY_CARE_PROVIDER_SITE_OTHER): Payer: PPO | Admitting: Diagnostic Neuroimaging

## 2018-05-04 ENCOUNTER — Ambulatory Visit (INDEPENDENT_AMBULATORY_CARE_PROVIDER_SITE_OTHER): Payer: PPO | Admitting: Diagnostic Neuroimaging

## 2018-05-04 DIAGNOSIS — R42 Dizziness and giddiness: Secondary | ICD-10-CM

## 2018-05-04 DIAGNOSIS — G629 Polyneuropathy, unspecified: Secondary | ICD-10-CM

## 2018-05-04 DIAGNOSIS — Z0289 Encounter for other administrative examinations: Secondary | ICD-10-CM

## 2018-05-11 NOTE — Procedures (Signed)
[facility]  NCS (NERVE CONDUCTION STUDY) WITH EMG (ELECTROMYOGRAPHY) REPORT   STUDY DATE: 05/04/18 PATIENT NAME: Jonathan Ryan DOB: [birth year] MRN: [id number]  ORDERING CLINICIAN: Antony Contras, MD   TECHNOLOGIST: Sherre Scarlet ELECTROMYOGRAPHER: Earlean Polka. Jossue Rubenstein, MD  CLINICAL INFORMATION: 85 year old male with gait difficulty, diabetic neuropathy, spinal stenosis.    FINDINGS: NERVE CONDUCTION STUDY: Bilateral tibial and right peroneal motor responses are normal.  Left peroneal motor response has response normal distal latency, decreased amplitude, borderline normal conduction velocity.  Bilateral sural sensory responses have normal peak latencies and decreased amplitudes.  Right superficial peroneal sensory response has prolonged peak latency and decreased amplitude.  Left superficial peroneal sensory response could not be obtained.  Bilateral tibial F wave latencies are normal.   NEEDLE ELECTROMYOGRAPHY:  Right vastus medialis, tibialis anterior, gastrocnemius muscles are normal.   IMPRESSION:   Abnormal study demonstrating: - Axonal sensorimotor polyneuropathy.  No definite electrodiagnostic evidence of underlying polyradiculopathy.    INTERPRETING PHYSICIAN:  Penni Bombard, MD Certified in Neurology, Neurophysiology and Neuroimaging  [facility] [address] [place], [place] [address] [phone]   [facility]    Nerve / Sites Muscle Latency Ref. Amplitude Ref. Rel Amp Segments Distance Velocity Ref. Area    ms ms mV mV %  cm m/s m/s mVms  R Peroneal - EDB     Ankle EDB 5.4 ?6.5 3.2 ?2.0 100 Ankle - EDB 9   10.7     Fib head EDB 12.7  2.7  84.6 Fib head - Ankle 32 44 ?44 9.7     Pop fossa EDB 14.9  2.3  86.6 Pop fossa - Fib head 10 44 ?44 8.5         Pop fossa - Ankle      L Peroneal - EDB     Ankle EDB 5.4 ?6.5 1.4 ?2.0 100 Ankle - EDB 9   4.5     Fib head EDB 12.9  1.0  72.3 Fib head - Ankle 32 43  ?44 3.7     Pop fossa EDB 15.2  0.6  55.6 Pop fossa - Fib head 10 43 ?44 1.9         Pop fossa - Ankle      R Tibial - AH     Ankle AH 4.1 ?5.8 8.9 ?4.0 100 Ankle - AH 9   24.8     Pop fossa AH 13.5  7.3  81.6 Pop fossa - Ankle 38 41 ?41 21.3  L Tibial - AH     Ankle AH 4.7 ?5.8 6.2 ?4.0 100 Ankle - AH 9   23.2     Pop fossa AH 14.1  4.3  69.6 Pop fossa - Ankle 38 41 ?41 18.5             SNC    Nerve / Sites Rec. Site Peak Lat Ref.  Amp Ref. Segments Distance    ms ms V V  cm  R Sural - Ankle (Calf)     Calf Ankle 3.9 ?4.4 3 ?6 Calf - Ankle 14  L Sural - Ankle (Calf)     Calf Ankle 3.5 ?4.4 5 ?6 Calf - Ankle 14  R Superficial peroneal - Ankle     Lat leg Ankle 6.6 ?4.4 4 ?6 Lat leg - Ankle 14  L Superficial peroneal - Ankle     Lat leg Ankle NR ?4.4 NR ?6 Lat leg - Ankle 14  F  Wave    Nerve F Lat Ref.   ms ms  R Tibial - AH 55.0 ?56.0  L Tibial - AH 55.7 ?56.0         EMG full       EMG Summary Table    Spontaneous MUAP Recruitment  Muscle IA Fib PSW Fasc Other Amp Dur. Poly Pattern  R. Vastus medialis Normal None None None _______ Normal Normal Normal Normal  R. Tibialis anterior Normal None None None _______ Normal Normal Normal Normal  R. Gastrocnemius (Medial head) Normal None None None _______ Normal Normal Normal Normal

## 2018-05-15 ENCOUNTER — Telehealth: Payer: Self-pay | Admitting: *Deleted

## 2018-05-15 NOTE — Telephone Encounter (Signed)
I spoke with the patient and discussed his EMG results from Dr. Leonie Man. Encouraged pt to continue managing his Diabetes. He verbalized understanding and appreciation and he had no questions.

## 2018-05-15 NOTE — Telephone Encounter (Signed)
-----   Message from Garvin Fila, MD sent at 05/11/2018  5:38 PM EST ----- Kindly inform the patient that nerve conduction EMG study confirms peripheral neuropathy likely related to his diabetes.  No unexpected or worrisome finding

## 2018-05-15 NOTE — Telephone Encounter (Signed)
Tried to call pt twice to review EMG results. Phone connected but no-one answered.

## 2018-05-26 DIAGNOSIS — R05 Cough: Secondary | ICD-10-CM | POA: Diagnosis not present

## 2018-05-26 DIAGNOSIS — J208 Acute bronchitis due to other specified organisms: Secondary | ICD-10-CM | POA: Diagnosis not present

## 2018-06-06 DIAGNOSIS — Z6826 Body mass index (BMI) 26.0-26.9, adult: Secondary | ICD-10-CM | POA: Diagnosis not present

## 2018-06-06 DIAGNOSIS — J208 Acute bronchitis due to other specified organisms: Secondary | ICD-10-CM | POA: Diagnosis not present

## 2018-06-23 DIAGNOSIS — R5381 Other malaise: Secondary | ICD-10-CM | POA: Diagnosis not present

## 2018-06-23 DIAGNOSIS — I2510 Atherosclerotic heart disease of native coronary artery without angina pectoris: Secondary | ICD-10-CM | POA: Diagnosis not present

## 2018-06-23 DIAGNOSIS — I5032 Chronic diastolic (congestive) heart failure: Secondary | ICD-10-CM | POA: Diagnosis not present

## 2018-07-03 DIAGNOSIS — E1129 Type 2 diabetes mellitus with other diabetic kidney complication: Secondary | ICD-10-CM | POA: Diagnosis not present

## 2018-07-03 DIAGNOSIS — I2699 Other pulmonary embolism without acute cor pulmonale: Secondary | ICD-10-CM | POA: Diagnosis not present

## 2018-07-03 DIAGNOSIS — Z139 Encounter for screening, unspecified: Secondary | ICD-10-CM | POA: Diagnosis not present

## 2018-07-03 DIAGNOSIS — E785 Hyperlipidemia, unspecified: Secondary | ICD-10-CM | POA: Diagnosis not present

## 2018-07-03 DIAGNOSIS — D638 Anemia in other chronic diseases classified elsewhere: Secondary | ICD-10-CM | POA: Diagnosis not present

## 2018-07-03 DIAGNOSIS — I10 Essential (primary) hypertension: Secondary | ICD-10-CM | POA: Diagnosis not present

## 2018-07-03 DIAGNOSIS — I6990 Unspecified sequelae of unspecified cerebrovascular disease: Secondary | ICD-10-CM | POA: Diagnosis not present

## 2018-07-06 DIAGNOSIS — E1129 Type 2 diabetes mellitus with other diabetic kidney complication: Secondary | ICD-10-CM | POA: Diagnosis not present

## 2018-07-12 DIAGNOSIS — E782 Mixed hyperlipidemia: Secondary | ICD-10-CM | POA: Diagnosis not present

## 2018-07-12 DIAGNOSIS — I10 Essential (primary) hypertension: Secondary | ICD-10-CM | POA: Diagnosis not present

## 2018-07-12 DIAGNOSIS — M85852 Other specified disorders of bone density and structure, left thigh: Secondary | ICD-10-CM | POA: Diagnosis not present

## 2018-07-19 ENCOUNTER — Ambulatory Visit: Payer: PPO | Admitting: Cardiology

## 2018-07-19 ENCOUNTER — Telehealth: Payer: Self-pay | Admitting: *Deleted

## 2018-07-19 ENCOUNTER — Telehealth: Payer: Self-pay | Admitting: Cardiology

## 2018-07-19 DIAGNOSIS — I10 Essential (primary) hypertension: Secondary | ICD-10-CM | POA: Diagnosis not present

## 2018-07-19 DIAGNOSIS — E785 Hyperlipidemia, unspecified: Secondary | ICD-10-CM | POA: Diagnosis not present

## 2018-07-19 DIAGNOSIS — R55 Syncope and collapse: Secondary | ICD-10-CM

## 2018-07-19 DIAGNOSIS — K580 Irritable bowel syndrome with diarrhea: Secondary | ICD-10-CM | POA: Diagnosis not present

## 2018-07-19 DIAGNOSIS — F419 Anxiety disorder, unspecified: Secondary | ICD-10-CM | POA: Diagnosis not present

## 2018-07-19 DIAGNOSIS — R5383 Other fatigue: Secondary | ICD-10-CM | POA: Diagnosis not present

## 2018-07-19 DIAGNOSIS — R42 Dizziness and giddiness: Secondary | ICD-10-CM | POA: Diagnosis not present

## 2018-07-19 DIAGNOSIS — Z0000 Encounter for general adult medical examination without abnormal findings: Secondary | ICD-10-CM | POA: Diagnosis not present

## 2018-07-19 DIAGNOSIS — E039 Hypothyroidism, unspecified: Secondary | ICD-10-CM | POA: Diagnosis not present

## 2018-07-19 DIAGNOSIS — I129 Hypertensive chronic kidney disease with stage 1 through stage 4 chronic kidney disease, or unspecified chronic kidney disease: Secondary | ICD-10-CM | POA: Diagnosis not present

## 2018-07-19 DIAGNOSIS — R000 Tachycardia, unspecified: Secondary | ICD-10-CM | POA: Diagnosis not present

## 2018-07-19 DIAGNOSIS — M5415 Radiculopathy, thoracolumbar region: Secondary | ICD-10-CM | POA: Diagnosis not present

## 2018-07-19 DIAGNOSIS — R002 Palpitations: Secondary | ICD-10-CM | POA: Diagnosis not present

## 2018-07-19 DIAGNOSIS — N951 Menopausal and female climacteric states: Secondary | ICD-10-CM | POA: Diagnosis not present

## 2018-07-19 NOTE — Telephone Encounter (Signed)
Monitor was mailed to pt per Dr. Agustin Cree

## 2018-07-19 NOTE — Telephone Encounter (Signed)
Left message for patient to return call.

## 2018-07-19 NOTE — Telephone Encounter (Signed)
Patient states he is so weak he is almost passing out. His BP is 141/91 with pulse of 76

## 2018-07-19 NOTE — Telephone Encounter (Signed)
Per Dr. Agustin Cree pt will have holter monitor mailed to him and cancel today's appt.

## 2018-07-19 NOTE — Telephone Encounter (Signed)
Quite complicated patient with coronary artery disease, cardiac catheterization 2013 showing no evidence of obstructive coronary artery disease.  Stress test done in 2019 showed no evidence of ischemia, presence of ascending/descending aortic aneurysm follow-up by Dr. Sammuel Hines with no plans to intervene, status post aortic valve replacement with bioprosthesis.  Also longstanding history of dizziness and passing out.   Today we received a urgent request from his primary care physician to see him as quickly as possible.  I spoke to his primary care physician asking what is the reason for the consult is.  Apparently he is having episodes of dizziness and passing out however after reviewing the chart I see this is a chronic problem that being investigated previously in detail and conclusion was that this is most likely orthostatic hypotension.  I called the gentleman home and I spoke to him over the phone.  He tells me that this is a chronic problem he said he got it for long time when he gets up to get dizzy he never fell down on the ground and injured himself he said he is always able to catch himself before hitting the floor.  His primary care physician told me today however when he came to his office his heart rate was 180 when eventually EKG was done and I do have a copy of this EKG he was in normal sinus rhythm with first-degree AV block at rate of 80.  There was left anterior fascicular block present.  Some PVCs.  Therefore, we decided to mail to him monitor that he will wear it for 7 days.  I told him if you have any symptoms of dizziness he need to press the button.  We will make arrangements for his visit to follow-up with [place].  Also told him if he passed out or did not get worse he need to go to the emergency room. I spoke to his primary care physician Dr. Carolanne Grumbling who concur with this plan.

## 2018-07-21 ENCOUNTER — Telehealth: Payer: Self-pay | Admitting: Emergency Medicine

## 2018-07-21 NOTE — Telephone Encounter (Signed)
Patient's wife called asking about the patient's monitor and why he was going to have this, I went over Dr. Wendy Poet last phone note with her and explained the rationale. Patient verbally understands. I informed her the monitor has been mailed to them. I also advised her if the patient passes out or gets worse to go to the emergency department per Dr. Agustin Cree.

## 2018-07-24 ENCOUNTER — Telehealth: Payer: Self-pay | Admitting: *Deleted

## 2018-07-24 NOTE — Telephone Encounter (Signed)
Spoke with Aurelio Jew (on dpr) and let her know pt can come in tomorrow to have monitor put on by [facility] per Dr. Agustin Cree. Best time would be mid morning.

## 2018-07-24 NOTE — Telephone Encounter (Signed)
Pt's emergency contact called to say they have received monitor and do not feel comfortable putting it on. She stated he had almost passed out twice in last couple of days. Needs someone to put monitor on for him. I let her know I would talk to you and get back with her. Please advise.

## 2018-07-25 ENCOUNTER — Encounter: Payer: Self-pay | Admitting: Cardiology

## 2018-07-25 ENCOUNTER — Other Ambulatory Visit: Payer: Self-pay

## 2018-07-25 ENCOUNTER — Ambulatory Visit (INDEPENDENT_AMBULATORY_CARE_PROVIDER_SITE_OTHER): Payer: PPO | Admitting: Cardiology

## 2018-07-25 ENCOUNTER — Telehealth: Payer: Self-pay

## 2018-07-25 ENCOUNTER — Ambulatory Visit (INDEPENDENT_AMBULATORY_CARE_PROVIDER_SITE_OTHER): Payer: PPO

## 2018-07-25 VITALS — BP 98/66 | HR 82 | Ht 70.0 in | Wt 170.0 lb

## 2018-07-25 DIAGNOSIS — I110 Hypertensive heart disease with heart failure: Secondary | ICD-10-CM | POA: Diagnosis not present

## 2018-07-25 DIAGNOSIS — R55 Syncope and collapse: Secondary | ICD-10-CM | POA: Diagnosis not present

## 2018-07-25 DIAGNOSIS — G5762 Lesion of plantar nerve, left lower limb: Secondary | ICD-10-CM | POA: Diagnosis not present

## 2018-07-25 DIAGNOSIS — G5761 Lesion of plantar nerve, right lower limb: Secondary | ICD-10-CM | POA: Diagnosis not present

## 2018-07-25 DIAGNOSIS — I712 Thoracic aortic aneurysm, without rupture, unspecified: Secondary | ICD-10-CM

## 2018-07-25 DIAGNOSIS — G603 Idiopathic progressive neuropathy: Secondary | ICD-10-CM | POA: Diagnosis not present

## 2018-07-25 DIAGNOSIS — Z953 Presence of xenogenic heart valve: Secondary | ICD-10-CM

## 2018-07-25 NOTE — Patient Instructions (Signed)
Medication Instructions:  Your physician recommends that you continue on your current medications as directed. Please refer to the Current Medication list given to you today.  If you need a refill on your cardiac medications before your next appointment, please call your pharmacy.   Lab work: NONE If you have labs (blood work) drawn today and your tests are completely normal, you will receive your results only by: Marland Kitchen MyChart Message (if you have MyChart) OR . A paper copy in the mail If you have any lab test that is abnormal or we need to change your treatment, we will call you to review the results.  Testing/Procedures: An EKG was performed today  Follow-Up: At [facility], you and your health needs are our priority.  As part of our continuing mission to provide you with exceptional heart care, we have created designated Provider Care Teams.  These Care Teams include your primary Cardiologist (physician) and Advanced Practice Providers (APPs -  Physician Assistants and Nurse Practitioners) who all work together to provide you with the care you need, when you need it. You will need a follow up appointment in 1 months.

## 2018-07-25 NOTE — Telephone Encounter (Signed)
Patient verbally consented to telehealth visit in person

## 2018-07-25 NOTE — Progress Notes (Addendum)
Cardiology Office Note:    Date:  07/25/2018   ID:  Jonathan Ryan, DOB April 02, 1929, MRN [id number]  PCP:  Nicoletta Dress, MD  Cardiologist:  Shirlee More, MD    Referring MD: Nicoletta Dress, MD    ASSESSMENT:    1. Near syncope   2. Hypertensive heart disease with heart failure ([place])   3. S/P aortic valve replacement with bioprosthetic valve   4. Thoracic aortic aneurysm without rupture ([place])    PLAN:    In order of problems listed above:  1. This appears to be very characteristic neurogenic orthostatic hypotension.  I offered him treatment he declined we will await the results of his monitor if willing to accept it midodrine can be quite helpful and related that he would be best served with the test of time as the episodes occur so frequently we be able to tell quickly if he responds.  I will readdress at his next visit 2. He is hypotensive in my office today I would not give him any vasodilators and will address the issue of beta-blocker next visit 3. No evidence of valve dysfunction clinically we will access that echocardiogram at [facility] for reassurance 4. Followed at [facility] felt to be a poor candidate for intervention   Next appointment: 1 month virtual   Medication Adjustments/Labs and Tests Ordered: Current medicines are reviewed at length with the patient today.  Concerns regarding medicines are outlined above.  Orders Placed This Encounter  Procedures  . EKG 12-Lead   No orders of the defined types were placed in this encounter.   No chief complaint on file.   History of Present Illness:    Jonathan Ryan is a 85 y.o. male with a hx of non-obstructive CAD, AS s/p AVR (2013), Heart failure, hyperlipidemia, type II DM, and thoracic aneurysmal disease last seen by Dr Geraldo Pitter 12/17/14. A TTE in April 2017 at [facility] showed EF 55% and normal AVR function P/M 22/12 mmHg. He was evaluated by Dr Curt Bears EP July 2017 for presyncope with a normal 30  day event monitor  He was last seen 05/31/17. Compliance with diet, lifestyle and medications: Yes  Dr. Agustin Cree interacted with the patient his primary care physician over the phone made a decision to repeat an extended ambulatory heart rhythm monitor and when the patient arrived at the office today he all but demanded to be seen.  His complaint is he has frequent daily episodes of near syncope overall postural in nature he had his beta-blocker discontinued because of low blood pressure and after the episodes are over he checks his blood pressure at times is elevated.  I had seen him in the past with symptomatic orthostatic hypotension.  His episodes are very characteristic.  He is not having syncope TIA chest pain edema shortness of breath.  I met with him in the office reviewed his EKG showing sinus rhythm normal conduction advised him to start treatment with midodrine to mitigate his symptoms he declined he told me it is his opinion that these are TIAs so we decided we would utilize the event monitor and do a virtual visit in 4 weeks.  He is using support hose.  None of these episodes are associated with focal neurologic events he has no palpitation has not lost consciousness and they are all orthostatic the episode that occurred today was while he was standing in the kitchen using the microwave.  His episodes have been present for years but  have worsened recently  I was able to access the echocardiogram done at [facility] 02/13/2018.  It showed the presence of mild concentric left ventricular hypertrophy normal ejection fraction 55 to 60% severe left atrial enlargement normally functioning aortic bioprosthesis trace mitral regurgitation mild tricuspid regurgitation and confirmed the presence of known aneurysm in the thoracic aorta which is being managed and followed at [facility] Past Medical History:  Diagnosis Date  . Aortic valve disorder 06/11/2015  . Arthritis   . Blood transfusion  without reported diagnosis   . CAD (coronary artery disease)   . Carotid artery disease ([place]) 06/11/2015  . Cataract   . CHF (congestive heart failure) ([place])   . Colostomy in place [facility]) 06/11/2015  . Coronary artery disease   . Coronary artery disease involving native coronary artery of native heart without angina pectoris 01/06/2015  . Descending thoracic aortic aneurysm ([place])   . Diabetes mellitus without complication ([place])   . Diverticular disease of left colon   . Emphysema of lung ([place])    as a child  . GERD (gastroesophageal reflux disease)   . HTN (hypertension) 06/11/2015  . Hyperlipidemia 01/06/2015  . Hypertension   . Near syncope 06/10/2015  . Pre-syncope 06/10/2015  . Pulmonary embolism ([place])   . Right hip pain 12/15/2016   Overview:  Added automatically from request for surgery [id number] Overview:  Added automatically from request for surgery [phone]  . S/P aortic valve replacement with bioprosthetic valve 01/06/2015  . Statin intolerance 01/06/2015  . Stroke ([place])    08/01/2015  . Thoracic aortic aneurysm without rupture ([place]) 01/06/2015  . Thoracic ascending aortic aneurysm ([place])   . Thyroid disease    reports nodules  . Type 2 diabetes mellitus with complication ([place]) 04/16/4006  . Vertebral artery stenosis     Past Surgical History:  Procedure Laterality Date  . AORTIC VALVE REPAIR    . COLON SURGERY    . HERNIA REPAIR    . SMALL INTESTINE SURGERY      Current Medications: Current Meds  Medication Sig  . apixaban (ELIQUIS) 5 MG TABS tablet Take 5 mg by mouth 2 (two) times daily.   . Cholecalciferol (VITAMIN D3) 1000 units CAPS Take 1 capsule by mouth 2 (two) times daily.   . finasteride (PROSCAR) 5 MG tablet Take 5 mg by mouth daily.  . furosemide (LASIX) 40 MG tablet Take 20 mg by mouth daily.   . insulin detemir (LEVEMIR) 100 UNIT/ML injection Inject 35 Units into the skin at bedtime.   . metFORMIN (GLUCOPHAGE) 1000 MG tablet Take 1,000 mg by mouth 2 (two) times daily  with a meal.   . metoprolol succinate (TOPROL-XL) 25 MG 24 hr tablet Take 1 tablet by mouth daily.  . Multiple Vitamin (MULTI-VITAMINS) TABS Take 1 tablet by mouth daily.   Marland Kitchen zinc gluconate 50 MG tablet Take 50 mg by mouth daily.     Allergies:   Penicillins; Ramipril; Vancomycin; and Oxycodone   Social History   Socioeconomic History  . Marital status: Widowed    Spouse name: Not on file  . Number of children: Not on file  . Years of education: Not on file  . Highest education level: Not on file  Occupational History  . Not on file  Social Needs  . Financial resource strain: Not on file  . Food insecurity:    Worry: Not on file    Inability: Not on file  . Transportation needs:  Medical: Not on file    Non-medical: Not on file  Tobacco Use  . Smoking status: Former Smoker    Types: Cigarettes  . Smokeless tobacco: Never Used  . Tobacco comment: Smoked 2 packs/ week as a teenager   Substance and Sexual Activity  . Alcohol use: No    Alcohol/week: 0.0 standard drinks  . Drug use: No  . Sexual activity: Not on file  Lifestyle  . Physical activity:    Days per week: Not on file    Minutes per session: Not on file  . Stress: Not on file  Relationships  . Social connections:    Talks on phone: Not on file    Gets together: Not on file    Attends religious service: Not on file    Active member of club or organization: Not on file    Attends meetings of clubs or organizations: Not on file    Relationship status: Not on file  Other Topics Concern  . Not on file  Social History Narrative  . Not on file     Family History: The patient's family history includes Bladder Cancer in his brother; Breast cancer in his sister; Heart attack in his father; Heart disease in his father. ROS:   Please see the history of present illness.    All other systems reviewed and are negative.  EKGs/Labs/Other Studies Reviewed:    The following studies were reviewed today:  EKG:   EKG ordered today and personally reviewed.  The ekg ordered today demonstrates sinus rhythm normal conduction no ischemic changes  Echo 05/12/17: Study Conclusions - Left ventricle: The cavity size was normal. Systolic function was   normal. The estimated ejection fraction was in the range of 55%   to 60%. Wall motion was normal; there were no regional wall   motion abnormalities. - Aortic valve: A bioprosthesis was present. There was mild to   moderate stenosis. Valve area (VTI): 0.81 cm^2. Valve area   (Vmax): 0.88 cm^2. Valve area (Vmean): 0.87 cm^2. - Mitral valve: Calcified annulus. Valve area by pressure   half-time: 2.02 cm^2. Valve area by continuity equation (using   LVOT flow): 1.11 cm^2. - Left atrium: The atrium was moderately dilated. - Tricuspid valve: There was moderate regurgitation. - Pulmonary arteries: PA peak pressure: 44 mm Hg (S).  Recent Labs:   07/03/2018 creatinine 0.92 cholesterol 182 HDL 41 LDL 126 IMA globin 13.7 potassium 4.8 No results found for requested labs within last 8760 hours.  Recent Lipid Panel No results found for: CHOL, TRIG, HDL, CHOLHDL, VLDL, LDLCALC, LDLDIRECT  He had an echocardiogram at [facility] November 2019 report requested Physical Exam:    VS:  BP 98/66 (BP Location: Left Arm, Patient Position: Sitting)   Pulse 82   Ht '5\' 10"'  (1.778 m)   Wt 170 lb (77.1 kg)   SpO2 98%   BMI 24.39 kg/m     Wt Readings from Last 3 Encounters:  07/25/18 170 lb (77.1 kg)  03/16/18 179 lb (81.2 kg)  05/17/17 185 lb (83.9 kg)     GEN: He looks chronically ill affect is flat in no acute distress HEENT: Normal NECK: No JVD; No carotid bruits LYMPHATICS: No lymphadenopathy CARDIAC: RRR, no murmurs, rubs, gallops RESPIRATORY:  Clear to auscultation without rales, wheezing or rhonchi  ABDOMEN: Soft, non-tender, non-distended MUSCULOSKELETAL:  No edema; No deformity  SKIN: Warm and dry NEUROLOGIC:  Alert and oriented x 3 PSYCHIATRIC:   Normal affect  Signed, Shirlee More, MD  07/25/2018 11:14 AM    [place]

## 2018-08-01 ENCOUNTER — Telehealth: Payer: Self-pay | Admitting: Cardiovascular Disease

## 2018-08-01 NOTE — Telephone Encounter (Signed)
Attempted to call several times over the last hour and the phone is busy. The patient is followed by Dr. Bettina Gavia and was seen 1 week ago. There is no indication in Dr. Joya Gaskins note that the patient needs an appointment with Dr. Burt Knack (and Dr. Burt Knack is not currently accepting new patients).   Will try to call again later.

## 2018-08-01 NOTE — Telephone Encounter (Signed)
New Message:   Patient would like to see Dr. Burt Knack is being referred Dr. Delena Bali sending over a urgent. Patient girlfriend calling. Please call patient girlfriend.

## 2018-08-03 NOTE — Telephone Encounter (Signed)
Jonathan Ryan states he has passed out several times and gets dizzy when he stands up. It has been a problem for years but has recently become worse.  He states his girlfriend called to get an appointment with a cardiologist because they are "doctor shopping" for answers. Reiterated to him that he saw Dr. Bettina Gavia recently and he states he wants Dr. Bettina Gavia to continue being his cardiologist. He also states he has an appointment with [facility] soon to see what they can do.  At this time, he states he will keep his follow-up visit with Dr. Bettina Gavia. He was grateful for call back.

## 2018-08-07 ENCOUNTER — Telehealth: Payer: Self-pay | Admitting: *Deleted

## 2018-08-07 DIAGNOSIS — Z8679 Personal history of other diseases of the circulatory system: Secondary | ICD-10-CM | POA: Diagnosis not present

## 2018-08-07 DIAGNOSIS — Z952 Presence of prosthetic heart valve: Secondary | ICD-10-CM | POA: Diagnosis not present

## 2018-08-07 DIAGNOSIS — I110 Hypertensive heart disease with heart failure: Secondary | ICD-10-CM | POA: Diagnosis not present

## 2018-08-07 DIAGNOSIS — R55 Syncope and collapse: Secondary | ICD-10-CM | POA: Diagnosis not present

## 2018-08-07 DIAGNOSIS — R42 Dizziness and giddiness: Secondary | ICD-10-CM

## 2018-08-07 DIAGNOSIS — I509 Heart failure, unspecified: Secondary | ICD-10-CM | POA: Diagnosis not present

## 2018-08-07 HISTORY — DX: Dizziness and giddiness: R42

## 2018-08-07 NOTE — Telephone Encounter (Signed)
Called to see how long pt needs to wear monitor. I let them know it was 7 days so should be mailing it back now. Can go to [employer] or schedule a pick up by calling number on the box. Ms Jonathan Ryan said okay.

## 2018-08-11 DIAGNOSIS — R5383 Other fatigue: Secondary | ICD-10-CM | POA: Diagnosis not present

## 2018-08-14 ENCOUNTER — Ambulatory Visit: Payer: PPO | Admitting: Cardiology

## 2018-08-14 DIAGNOSIS — Z9181 History of falling: Secondary | ICD-10-CM | POA: Diagnosis not present

## 2018-08-14 DIAGNOSIS — Z136 Encounter for screening for cardiovascular disorders: Secondary | ICD-10-CM | POA: Diagnosis not present

## 2018-08-14 DIAGNOSIS — Z0000 Encounter for general adult medical examination without abnormal findings: Secondary | ICD-10-CM | POA: Diagnosis not present

## 2018-08-14 DIAGNOSIS — Z1331 Encounter for screening for depression: Secondary | ICD-10-CM | POA: Diagnosis not present

## 2018-08-14 DIAGNOSIS — E785 Hyperlipidemia, unspecified: Secondary | ICD-10-CM | POA: Diagnosis not present

## 2018-08-16 ENCOUNTER — Telehealth: Payer: Self-pay | Admitting: Cardiology

## 2018-08-16 NOTE — Progress Notes (Signed)
Virtual Visit via Telephone Note   This visit type was conducted due to national recommendations for restrictions regarding the COVID-19 Pandemic (e.g. social distancing) in an effort to limit this patient's exposure and mitigate transmission in our community.  Due to his co-morbid illnesses, this patient is at least at moderate risk for complications without adequate follow up.  This format is felt to be most appropriate for this patient at this time.  The patient did not have access to video technology/had technical difficulties with video requiring transitioning to audio format only (telephone).  All issues noted in this document were discussed and addressed.  No physical exam could be performed with this format.  Please refer to the patient's chart for his  consent to telehealth for [facility].   Date:  08/17/2018   ID:  Jonathan Ryan, DOB 24-Jul-1928, MRN [id number]  Patient Location: Home Provider in office  PCP:  Nicoletta Dress, MD  Cardiologist:  No primary care provider on file. Dr Bettina Gavia Electrophysiologist:  None   Evaluation Performed:  Follow-Up Visit  Chief Complaint:  Syncope  History of Present Illness: Jonathan Ryan is a 85 y.o. male with a hx of non-obstructive CAD, AS s/p AVR (2013), Heart failure, hyperlipidemia, type II DM, and thoracic aneurysmal disease last seen by Dr Geraldo Pitter 12/17/14. A TTE in April 2017 at [facility] showed EF 55% and normal AVR function P/M 22/12 mmHg. He was evaluated by Dr Curt Bears EP July 2017 for presyncope with a normal 30 day event monitor  He was last seen by me 07/25/2018 with very characteristic symptomatic orthostatic hypotension with syncope.  At the time of the visit I was able to access the echocardiogram at [facility] 02/13/2018 which showed normally functioning aortic valve bioprosthesis normal ejection fraction.  He is a 30-day event monitor ordered by Dr. Agustin Cree which showed sinus rhythm no sustained arrhythmia  and no bradycardic events.  Event monitor is yet to be interpreted I independently reviewed it prior to the visit.  At the time of the last visit I asked him to accept Midodrine for symptomatic orthostatic hypotension and he declined.  Note he has a complex thoracic aortic aneurysm is followed by specialist [facility] is felt to be a very poor candidate for intervention.  The patient does not have symptoms concerning for COVID-19 infection (fever, chills, cough, or new shortness of breath).   Fortunately Mr. Mccanless had a second opinion [facility] that agreed that his postural symptoms are due to hypotension was prescribed an alpha agonist but never took the medication.  There are several barriers to health care 1 is that he is using a wrist cuff which is an accurate and the second is a firm belief on his part that he needs to take medicines to lower his blood pressure.  I think he understands that his problem is postural hypotension he is assured me he will go to the pharmacy and purchase a digital arm cuff and we will track his standing blood pressures and initiate treatment with midodrine.  Hopefully we can blunt these orthostatic episodes before he has a significant trauma.  His heart failure is compensated and his tissue aortic valve function is normal.  No edema shortness of breath chest pain and he has had no recurrent episodes of syncope but tells me he did have one episode of symptomatic hypoglycemia at the house. Past Medical History:  Diagnosis Date  . Aortic valve disorder 06/11/2015  .  Arthritis   . Blood transfusion without reported diagnosis   . CAD (coronary artery disease)   . Carotid artery disease ([place]) 06/11/2015  . Cataract   . CHF (congestive heart failure) ([place])   . Colostomy in place [facility]) 06/11/2015  . Coronary artery disease   . Coronary artery disease involving native coronary artery of native heart without angina pectoris 01/06/2015  . Descending thoracic aortic  aneurysm ([place])   . Diabetes mellitus without complication ([place])   . Diverticular disease of left colon   . Emphysema of lung ([place])    as a child  . GERD (gastroesophageal reflux disease)   . HTN (hypertension) 06/11/2015  . Hyperlipidemia 01/06/2015  . Hypertension   . Near syncope 06/10/2015  . Pre-syncope 06/10/2015  . Pulmonary embolism ([place])   . Right hip pain 12/15/2016   Overview:  Added automatically from request for surgery [id number] Overview:  Added automatically from request for surgery [phone]  . S/P aortic valve replacement with bioprosthetic valve 01/06/2015  . Statin intolerance 01/06/2015  . Stroke ([place])    08/01/2015  . Thoracic aortic aneurysm without rupture ([place]) 01/06/2015  . Thoracic ascending aortic aneurysm ([place])   . Thyroid disease    reports nodules  . Type 2 diabetes mellitus with complication ([place]) 06/10/4968  . Vertebral artery stenosis    Past Surgical History:  Procedure Laterality Date  . AORTIC VALVE REPAIR    . COLON SURGERY    . HERNIA REPAIR    . SMALL INTESTINE SURGERY       Current Meds  Medication Sig  . apixaban (ELIQUIS) 5 MG TABS tablet Take 5 mg by mouth 2 (two) times daily.   Marland Kitchen atorvastatin (LIPITOR) 20 MG tablet Take 1 tablet by mouth. Twice weekly  . Cholecalciferol (VITAMIN D3) 1000 units CAPS Take 1 capsule by mouth 2 (two) times daily.   . finasteride (PROSCAR) 5 MG tablet Take 5 mg by mouth daily.  . furosemide (LASIX) 40 MG tablet Take 20 mg by mouth daily.   . insulin detemir (LEVEMIR) 100 UNIT/ML injection Inject 35 Units into the skin at bedtime.   . metFORMIN (GLUCOPHAGE) 1000 MG tablet Take 1,000 mg by mouth 2 (two) times daily with a meal.   . metoprolol succinate (TOPROL-XL) 25 MG 24 hr tablet Take 1 tablet by mouth daily.  . Multiple Vitamin (MULTI-VITAMINS) TABS Take 1 tablet by mouth daily.   . Nutritional Supplements (PROSTA VITE PO) Take 1 tablet by mouth daily.  Marland Kitchen selenium 50 MCG TABS tablet Take 50 mcg by mouth daily.  Marland Kitchen  zinc gluconate 50 MG tablet Take 50 mg by mouth daily.     Allergies:   Penicillins; Ramipril; Vancomycin; and Oxycodone   Social History   Tobacco Use  . Smoking status: Former Smoker    Types: Cigarettes  . Smokeless tobacco: Never Used  . Tobacco comment: Smoked 2 packs/ week as a teenager   Substance Use Topics  . Alcohol use: No    Alcohol/week: 0.0 standard drinks  . Drug use: No     Family Hx: The patient's family history includes Bladder Cancer in his brother; Breast cancer in his sister; Heart attack in his father; Heart disease in his father.  ROS:   Please see the history of present illness.     All other systems reviewed and are negative.   Prior CV studies:   The following studies were reviewed today:   Labs/Other Tests and Data Reviewed:  Recent Labs: No results found for requested labs within last 8760 hours.   Recent Lipid Panel No results found for: CHOL, TRIG, HDL, CHOLHDL, LDLCALC, LDLDIRECT  Wt Readings from Last 3 Encounters:  08/17/18 173 lb 3.2 oz (78.6 kg)  07/25/18 170 lb (77.1 kg)  03/16/18 179 lb (81.2 kg)     Objective:    Vital Signs:  BP 121/79 (BP Location: Left Wrist, Patient Position: Sitting)   Pulse 77   Ht 5\' 10"  (1.778 m)   Wt 173 lb 3.2 oz (78.6 kg)   SpO2 98%   BMI 24.85 kg/m    VITAL SIGNS:  reviewed awake alert oriented x3 his affect is flat mood seemed depressed he had no respiratory distress or audible wheezing  ASSESSMENT & PLAN:    1. Chronic orthostatic hypotension, I think for the first time he accepts the diagnosis voices understanding will purchase a new blood pressure cuff and start treatment.  My goal is for standing blood pressure be greater than 756 systolic and to avoid episodes of trauma. 2. CAD stable continue medical therapy including anticoagulation and lipid-lowering treatment. 3. Heart failure chronic diastolic compensated continue his diuretic\ 4. AVR stable function 5. Hyperlipidemia  stable  COVID-19 Education: The signs and symptoms of COVID-19 were discussed with the patient and how to seek care for testing (follow up with PCP or arrange E-visit).  The importance of social distancing was discussed today.  Time:   Today, I have spent 25 minutes with the patient with telehealth technology discussing the above problems.     Medication Adjustments/Labs and Tests Ordered: Current medicines are reviewed at length with the patient today.  Concerns regarding medicines are outlined above.   Tests Ordered: No orders of the defined types were placed in this encounter.   Medication Changes: No orders of the defined types were placed in this encounter.   Disposition:  Follow up in 3 month(s)  Signed, Shirlee More, MD  08/17/2018 9:17 AM    [place]

## 2018-08-16 NOTE — Telephone Encounter (Signed)
Virtual Visit Pre-Appointment Phone Call  "(Name), I am calling you today to discuss your upcoming appointment. We are currently trying to limit exposure to the virus that causes COVID-19 by seeing patients at home rather than in the office."  1. "What is the BEST phone number to call the day of the visit?" - include this in appointment notes  2. Do you have or have access to (through a family member/friend) a smartphone with video capability that we can use for your visit?" a. If yes - list this number in appt notes as cell (if different from BEST phone #) and list the appointment type as a VIDEO visit in appointment notes b. If no - list the appointment type as a PHONE visit in appointment notes  3. Confirm consent - "In the setting of the current Covid19 crisis, you are scheduled for a (phone or video) visit with your provider on (date) at (time).  Just as we do with many in-office visits, in order for you to participate in this visit, we must obtain consent.  If you'd like, I can send this to your mychart (if signed up) or email for you to review.  Otherwise, I can obtain your verbal consent now.  All virtual visits are billed to your insurance company just like a normal visit would be.  By agreeing to a virtual visit, we'd like you to understand that the technology does not allow for your provider to perform an examination, and thus may limit your provider's ability to fully assess your condition. If your provider identifies any concerns that need to be evaluated in person, we will make arrangements to do so.  Finally, though the technology is pretty good, we cannot assure that it will always work on either your or our end, and in the setting of a video visit, we may have to convert it to a phone-only visit.  In either situation, we cannot ensure that we have a secure connection.  Are you willing to proceed?" STAFF: Did the patient verbally acknowledge consent to telehealth visit? Document  YES/NO here: Yes  4. Advise patient to be prepared - "Two hours prior to your appointment, go ahead and check your blood pressure, pulse, oxygen saturation, and your weight (if you have the equipment to check those) and write them all down. When your visit starts, your provider will ask you for this information. If you have an Apple Watch or Kardia device, please plan to have heart rate information ready on the day of your appointment. Please have a pen and paper handy nearby the day of the visit as well."  5. Give patient instructions for MyChart download to smartphone OR [email or web address] as below if video visit (depending on what platform provider is using)  6. Inform patient they will receive a phone call 15 minutes prior to their appointment time (may be from unknown caller ID) so they should be prepared to answer    TELEPHONE CALL NOTE  Jonathan Ryan has been deemed a candidate for a follow-up tele-health visit to limit community exposure during the Covid-19 pandemic. I spoke with the patient via phone to ensure availability of phone/video source, confirm preferred email & phone number, and discuss instructions and expectations.  I reminded Jonathan Ryan to be prepared with any vital sign and/or heart rhythm information that could potentially be obtained via home monitoring, at the time of his visit. I reminded Jonathan Ryan to expect a phone call prior to  his visit.  [name] 08/16/2018 4:05 PM   INSTRUCTIONS FOR DOWNLOADING THE MYCHART APP TO SMARTPHONE  - The patient must first make sure to have activated MyChart and know their login information - If Apple, go to [employer] and type in MyChart in the search bar and download the app. If Android, ask patient to go to [employer] and type in [place] in the search bar and download the app. The app is free but as with any other app downloads, their phone may require them to verify saved payment information or Apple/Android  password.  - The patient will need to then log into the app with their MyChart username and password, and select [facility] as their healthcare provider to link the account. When it is time for your visit, go to the MyChart app, find appointments, and click Begin Video Visit. Be sure to Select Allow for your device to access the Microphone and Camera for your visit. You will then be connected, and your provider will be with you shortly.  **If they have any issues connecting, or need assistance please contact MyChart service desk [phone])**  **If using a computer, in order to ensure the best quality for their visit they will need to use either of the following Internet Browsers: [employer], or Google Chrome**  IF USING DOXIMITY or DOXY.ME - The patient will receive a link just prior to their visit by text.     FULL LENGTH CONSENT FOR TELE-HEALTH VISIT   I hereby voluntarily request, consent and authorize [facility] and its employed or contracted physicians, physician assistants, nurse practitioners or other licensed health care professionals (the Practitioner), to provide me with telemedicine health care services (the Services") as deemed necessary by the treating Practitioner. I acknowledge and consent to receive the Services by the Practitioner via telemedicine. I understand that the telemedicine visit will involve communicating with the Practitioner through live audiovisual communication technology and the disclosure of certain medical information by electronic transmission. I acknowledge that I have been given the opportunity to request an in-person assessment or other available alternative prior to the telemedicine visit and am voluntarily participating in the telemedicine visit.  I understand that I have the right to withhold or withdraw my consent to the use of telemedicine in the course of my care at any time, without affecting my right to future care or treatment,  and that the Practitioner or I may terminate the telemedicine visit at any time. I understand that I have the right to inspect all information obtained and/or recorded in the course of the telemedicine visit and may receive copies of available information for a reasonable fee.  I understand that some of the potential risks of receiving the Services via telemedicine include:   Delay or interruption in medical evaluation due to technological equipment failure or disruption;  Information transmitted may not be sufficient (e.g. poor resolution of images) to allow for appropriate medical decision making by the Practitioner; and/or   In rare instances, security protocols could fail, causing a breach of personal health information.  Furthermore, I acknowledge that it is my responsibility to provide information about my medical history, conditions and care that is complete and accurate to the best of my ability. I acknowledge that Practitioner's advice, recommendations, and/or decision may be based on factors not within their control, such as incomplete or inaccurate data provided by me or distortions of diagnostic images or specimens that may result from electronic transmissions. I  understand that the practice of medicine is not an exact science and that Practitioner makes no warranties or guarantees regarding treatment outcomes. I acknowledge that I will receive a copy of this consent concurrently upon execution via email to the email address I last provided but may also request a printed copy by calling the office of [facility].    I understand that my insurance will be billed for this visit.   I have read or had this consent read to me.  I understand the contents of this consent, which adequately explains the benefits and risks of the Services being provided via telemedicine.   I have been provided ample opportunity to ask questions regarding this consent and the Services and have had my questions  answered to my satisfaction.  I give my informed consent for the services to be provided through the use of telemedicine in my medical care  By participating in this telemedicine visit I agree to the above.

## 2018-08-17 ENCOUNTER — Other Ambulatory Visit: Payer: Self-pay

## 2018-08-17 ENCOUNTER — Telehealth: Payer: Self-pay | Admitting: Cardiology

## 2018-08-17 ENCOUNTER — Encounter: Payer: Self-pay | Admitting: Cardiology

## 2018-08-17 ENCOUNTER — Telehealth (INDEPENDENT_AMBULATORY_CARE_PROVIDER_SITE_OTHER): Payer: PPO | Admitting: Cardiology

## 2018-08-17 VITALS — BP 121/79 | HR 77 | Ht 70.0 in | Wt 173.2 lb

## 2018-08-17 DIAGNOSIS — I951 Orthostatic hypotension: Secondary | ICD-10-CM

## 2018-08-17 DIAGNOSIS — I2510 Atherosclerotic heart disease of native coronary artery without angina pectoris: Secondary | ICD-10-CM

## 2018-08-17 DIAGNOSIS — Z7189 Other specified counseling: Secondary | ICD-10-CM

## 2018-08-17 DIAGNOSIS — Z953 Presence of xenogenic heart valve: Secondary | ICD-10-CM

## 2018-08-17 DIAGNOSIS — I5032 Chronic diastolic (congestive) heart failure: Secondary | ICD-10-CM

## 2018-08-17 NOTE — Telephone Encounter (Signed)
Virtual Visit Pre-Appointment Phone Call  "(Name), I am calling you today to discuss your upcoming appointment. We are currently trying to limit exposure to the virus that causes COVID-19 by seeing patients at home rather than in the office."  1. "What is the BEST phone number to call the day of the visit?" - include this in appointment notes  2. Do you have or have access to (through a family member/friend) a smartphone with video capability that we can use for your visit?" a. If yes - list this number in appt notes as cell (if different from BEST phone #) and list the appointment type as a VIDEO visit in appointment notes b. If no - list the appointment type as a PHONE visit in appointment notes  Confirm consent - "In the setting of the current Covid19 crisis, you are scheduled for a (phone or video) visit with your provider on (date) at (time).  Just as we do with many in-office visits, in order for you to participate in this visit, we must obtain consent.  If you'd like, I can send this to your mychart (if signed up) or email for you to review.  Otherwise, I can obtain your verbal consent now.  All virtual visits are billed to your insurance company just like a normal visit would be.  By agreeing to a virtual visit, we'd like you to understand that the technology does not allow for your provider to perform an examination, and thus may limit your provider's ability to fully assess your condition. If your provider identifies any concerns that need to be evaluated in person, we will make arrangements to do so.  Finally, though the technology is pretty good, we cannot assure that it will always work on either your or our end, and in the setting of a video visit, we may have to convert it to a phone-only visit.  In either situation, we cannot ensure that we have a secure connection.  Are you willing to proceed?" STAFF: Did the patient verbally acknowledge consent to telehealth visit? Document  YES/NO here: Yes verbal to Cameron Ali RN 07/25/2018 3. Advise patient to be prepared - "Two hours prior to your appointment, go ahead and check your blood pressure, pulse, oxygen saturation, and your weight (if you have the equipment to check those) and write them all down. When your visit starts, your provider will ask you for this information. If you have an Apple Watch or Kardia device, please plan to have heart rate information ready on the day of your appointment. Please have a pen and paper handy nearby the day of the visit as well."  4. Give patient instructions for MyChart download to smartphone OR [email or web address].me as below if video visit (depending on what platform provider is using)  5. Inform patient they will receive a phone call 15 minutes prior to their appointment time (may be from unknown caller ID) so they should be prepared to answer    TELEPHONE CALL NOTE  LARENCE THONE has been deemed a candidate for a follow-up tele-health visit to limit community exposure during the Covid-19 pandemic. I spoke with the patient via phone to ensure availability of phone/video source, confirm preferred email & phone number, and discuss instructions and expectations.  I reminded NOHLAN BURDIN to be prepared with any vital sign and/or heart rhythm information that could potentially be obtained via home monitoring, at the time of his visit. I reminded NAFTOLI PENNY to expect a  phone call prior to his visit.  Frederic Jericho 08/17/2018 8:30 AM   INSTRUCTIONS FOR DOWNLOADING THE MYCHART APP TO SMARTPHONE  - The patient must first make sure to have activated MyChart and know their login information - If Apple, go to [employer] and type in MyChart in the search bar and download the app. If Android, ask patient to go to [employer] and type in [place] in the search bar and download the app. The app is free but as with any other app downloads, their phone may require them to verify saved payment  information or Apple/Android password.  - The patient will need to then log into the app with their MyChart username and password, and select [facility] as their healthcare provider to link the account. When it is time for your visit, go to the MyChart app, find appointments, and click Begin Video Visit. Be sure to Select Allow for your device to access the Microphone and Camera for your visit. You will then be connected, and your provider will be with you shortly.  **If they have any issues connecting, or need assistance please contact MyChart service desk [phone])**  **If using a computer, in order to ensure the best quality for their visit they will need to use either of the following Internet Browsers: [employer], or Google Chrome**  IF USING DOXIMITY or DOXY.ME - The patient will receive a link just prior to their visit by text.     FULL LENGTH CONSENT FOR TELE-HEALTH VISIT   I hereby voluntarily request, consent and authorize [facility] and its employed or contracted physicians, physician assistants, nurse practitioners or other licensed health care professionals (the Practitioner), to provide me with telemedicine health care services (the Services") as deemed necessary by the treating Practitioner. I acknowledge and consent to receive the Services by the Practitioner via telemedicine. I understand that the telemedicine visit will involve communicating with the Practitioner through live audiovisual communication technology and the disclosure of certain medical information by electronic transmission. I acknowledge that I have been given the opportunity to request an in-person assessment or other available alternative prior to the telemedicine visit and am voluntarily participating in the telemedicine visit.  I understand that I have the right to withhold or withdraw my consent to the use of telemedicine in the course of my care at any time, without affecting my right  to future care or treatment, and that the Practitioner or I may terminate the telemedicine visit at any time. I understand that I have the right to inspect all information obtained and/or recorded in the course of the telemedicine visit and may receive copies of available information for a reasonable fee.  I understand that some of the potential risks of receiving the Services via telemedicine include:   Delay or interruption in medical evaluation due to technological equipment failure or disruption;  Information transmitted may not be sufficient (e.g. poor resolution of images) to allow for appropriate medical decision making by the Practitioner; and/or   In rare instances, security protocols could fail, causing a breach of personal health information.  Furthermore, I acknowledge that it is my responsibility to provide information about my medical history, conditions and care that is complete and accurate to the best of my ability. I acknowledge that Practitioner's advice, recommendations, and/or decision may be based on factors not within their control, such as incomplete or inaccurate data provided by me or distortions of diagnostic images or specimens that may result  from electronic transmissions. I understand that the practice of medicine is not an exact science and that Practitioner makes no warranties or guarantees regarding treatment outcomes. I acknowledge that I will receive a copy of this consent concurrently upon execution via email to the email address I last provided but may also request a printed copy by calling the office of [facility].    I understand that my insurance will be billed for this visit.   I have read or had this consent read to me.  I understand the contents of this consent, which adequately explains the benefits and risks of the Services being provided via telemedicine.   I have been provided ample opportunity to ask questions regarding this consent and the Services  and have had my questions answered to my satisfaction.  I give my informed consent for the services to be provided through the use of telemedicine in my medical care  By participating in this telemedicine visit I agree to the above.

## 2018-08-17 NOTE — Patient Instructions (Addendum)
Medication Instructions:  Your physician recommends that you continue on your current medications as directed. Please refer to the Current Medication list given to you today. m TAKE your midodrine 5mg   Three times  daily  If you need a refill on your cardiac medications before your next appointment, please call your pharmacy.   Lab work:None If you have labs (blood work) drawn today and your tests are completely normal, you will receive your results only by: Marland Kitchen MyChart Message (if you have MyChart) OR . A paper copy in the mail If you have any lab test that is abnormal or we need to change your treatment, we will call you to review the results.  Testing/Procedures: None  Follow-Up: At [facility], you and your health needs are our priority.  As part of our continuing mission to provide you with exceptional heart care, we have created designated Provider Care Teams.  These Care Teams include your primary Cardiologist (physician) and Advanced Practice Providers (APPs -  Physician Assistants and Nurse Practitioners) who all work together to provide you with the care you need, when you need it. You will need a follow up appointment in 3 months. Any Other Special Instructions Will Be Listed Below (If Applicable).  PLEASE GET A ARM BLOOD PRESSURE MACHINE AND CHECK BLOOD PRESSURE STANDING UP DAILY AND RECORD

## 2018-08-21 DIAGNOSIS — E663 Overweight: Secondary | ICD-10-CM | POA: Diagnosis not present

## 2018-08-21 DIAGNOSIS — Z952 Presence of prosthetic heart valve: Secondary | ICD-10-CM | POA: Diagnosis not present

## 2018-08-21 DIAGNOSIS — I2699 Other pulmonary embolism without acute cor pulmonale: Secondary | ICD-10-CM | POA: Diagnosis not present

## 2018-09-01 DIAGNOSIS — H40039 Anatomical narrow angle, unspecified eye: Secondary | ICD-10-CM | POA: Diagnosis not present

## 2018-09-01 DIAGNOSIS — Z01818 Encounter for other preprocedural examination: Secondary | ICD-10-CM | POA: Diagnosis not present

## 2018-09-01 DIAGNOSIS — E119 Type 2 diabetes mellitus without complications: Secondary | ICD-10-CM | POA: Diagnosis not present

## 2018-09-01 DIAGNOSIS — H25812 Combined forms of age-related cataract, left eye: Secondary | ICD-10-CM | POA: Diagnosis not present

## 2018-09-03 DIAGNOSIS — I951 Orthostatic hypotension: Secondary | ICD-10-CM | POA: Diagnosis not present

## 2018-09-03 DIAGNOSIS — I959 Hypotension, unspecified: Secondary | ICD-10-CM | POA: Diagnosis not present

## 2018-09-03 DIAGNOSIS — I2510 Atherosclerotic heart disease of native coronary artery without angina pectoris: Secondary | ICD-10-CM | POA: Diagnosis not present

## 2018-09-03 DIAGNOSIS — I509 Heart failure, unspecified: Secondary | ICD-10-CM | POA: Diagnosis not present

## 2018-09-03 DIAGNOSIS — E119 Type 2 diabetes mellitus without complications: Secondary | ICD-10-CM | POA: Diagnosis not present

## 2018-09-03 DIAGNOSIS — I499 Cardiac arrhythmia, unspecified: Secondary | ICD-10-CM | POA: Diagnosis not present

## 2018-09-03 DIAGNOSIS — F0390 Unspecified dementia without behavioral disturbance: Secondary | ICD-10-CM | POA: Diagnosis not present

## 2018-09-03 DIAGNOSIS — R531 Weakness: Secondary | ICD-10-CM | POA: Diagnosis not present

## 2018-09-03 DIAGNOSIS — Z951 Presence of aortocoronary bypass graft: Secondary | ICD-10-CM | POA: Diagnosis not present

## 2018-09-03 DIAGNOSIS — Z87891 Personal history of nicotine dependence: Secondary | ICD-10-CM | POA: Diagnosis not present

## 2018-09-05 ENCOUNTER — Telehealth: Payer: Self-pay | Admitting: Cardiology

## 2018-09-05 NOTE — Telephone Encounter (Signed)
Let pt know that Dr. Bettina Gavia wants him to check with his primary care provider.

## 2018-09-05 NOTE — Telephone Encounter (Signed)
Returned patient's call. He reports that he had a spell this morning of sudden weakness and not feeling good. He was not wearing his compression stockings today when it happened. This happens when he's moving around or stands too long or stands up suddenly. BP 120/79 this morning after  Spell, HR 78. Was prescribed midodrine at last office visit but has not started taking it. BP's normally run in the 114-120's/60's-70's and heart rates 60's-70's.    Denies chest pain or shortness of breath. He has some swelling in his legs which is not new and compression stockings help with that.     While we were talking, pt had another episode of not feeling well, sat down, pulse 179 bpm, BP 129/84.     States he went to ER yesterday when a similar episode happened. They kept him for 4 hours, monitored him, did not see any ectopy or irregular rhythms and released him to home.      pls advise.

## 2018-09-05 NOTE — Telephone Encounter (Signed)
Attempted to reach patient twice, no answer no voicemail set up. Will attempt again later.

## 2018-09-05 NOTE — Telephone Encounter (Signed)
Very week, low blood pressure/thinks he needs a monitor

## 2018-09-05 NOTE — Telephone Encounter (Signed)
He has had an extensive evaluation I do not think he requires another monitor.  I would have him follow-up with his family physician

## 2018-09-09 DIAGNOSIS — R55 Syncope and collapse: Secondary | ICD-10-CM | POA: Diagnosis not present

## 2018-09-09 DIAGNOSIS — R002 Palpitations: Secondary | ICD-10-CM | POA: Diagnosis not present

## 2018-09-09 DIAGNOSIS — Z79890 Hormone replacement therapy (postmenopausal): Secondary | ICD-10-CM | POA: Diagnosis not present

## 2018-09-09 DIAGNOSIS — Z933 Colostomy status: Secondary | ICD-10-CM | POA: Diagnosis not present

## 2018-09-09 DIAGNOSIS — I5032 Chronic diastolic (congestive) heart failure: Secondary | ICD-10-CM | POA: Diagnosis not present

## 2018-09-09 DIAGNOSIS — R079 Chest pain, unspecified: Secondary | ICD-10-CM | POA: Diagnosis not present

## 2018-09-09 DIAGNOSIS — R0989 Other specified symptoms and signs involving the circulatory and respiratory systems: Secondary | ICD-10-CM | POA: Diagnosis not present

## 2018-09-09 DIAGNOSIS — F0390 Unspecified dementia without behavioral disturbance: Secondary | ICD-10-CM | POA: Diagnosis not present

## 2018-09-09 DIAGNOSIS — I2510 Atherosclerotic heart disease of native coronary artery without angina pectoris: Secondary | ICD-10-CM | POA: Diagnosis not present

## 2018-09-09 DIAGNOSIS — Z79899 Other long term (current) drug therapy: Secondary | ICD-10-CM | POA: Diagnosis not present

## 2018-09-09 DIAGNOSIS — I5030 Unspecified diastolic (congestive) heart failure: Secondary | ICD-10-CM | POA: Diagnosis not present

## 2018-09-09 DIAGNOSIS — Z952 Presence of prosthetic heart valve: Secondary | ICD-10-CM | POA: Diagnosis not present

## 2018-09-09 DIAGNOSIS — R000 Tachycardia, unspecified: Secondary | ICD-10-CM | POA: Diagnosis not present

## 2018-09-09 DIAGNOSIS — R531 Weakness: Secondary | ICD-10-CM | POA: Diagnosis not present

## 2018-09-09 DIAGNOSIS — Z8249 Family history of ischemic heart disease and other diseases of the circulatory system: Secondary | ICD-10-CM | POA: Diagnosis not present

## 2018-09-09 DIAGNOSIS — I739 Peripheral vascular disease, unspecified: Secondary | ICD-10-CM | POA: Diagnosis not present

## 2018-09-09 DIAGNOSIS — Z953 Presence of xenogenic heart valve: Secondary | ICD-10-CM | POA: Diagnosis not present

## 2018-09-09 DIAGNOSIS — E860 Dehydration: Secondary | ICD-10-CM | POA: Diagnosis not present

## 2018-09-09 DIAGNOSIS — I471 Supraventricular tachycardia: Secondary | ICD-10-CM | POA: Diagnosis not present

## 2018-09-09 DIAGNOSIS — E119 Type 2 diabetes mellitus without complications: Secondary | ICD-10-CM | POA: Diagnosis not present

## 2018-09-09 DIAGNOSIS — R0902 Hypoxemia: Secondary | ICD-10-CM | POA: Diagnosis not present

## 2018-09-09 DIAGNOSIS — Z794 Long term (current) use of insulin: Secondary | ICD-10-CM | POA: Diagnosis not present

## 2018-09-09 DIAGNOSIS — I712 Thoracic aortic aneurysm, without rupture: Secondary | ICD-10-CM | POA: Diagnosis not present

## 2018-09-09 DIAGNOSIS — E1151 Type 2 diabetes mellitus with diabetic peripheral angiopathy without gangrene: Secondary | ICD-10-CM | POA: Diagnosis not present

## 2018-09-09 DIAGNOSIS — I358 Other nonrheumatic aortic valve disorders: Secondary | ICD-10-CM | POA: Diagnosis not present

## 2018-09-09 DIAGNOSIS — Z87891 Personal history of nicotine dependence: Secondary | ICD-10-CM | POA: Diagnosis not present

## 2018-09-09 DIAGNOSIS — Z7982 Long term (current) use of aspirin: Secondary | ICD-10-CM | POA: Diagnosis not present

## 2018-09-09 DIAGNOSIS — E11649 Type 2 diabetes mellitus with hypoglycemia without coma: Secondary | ICD-10-CM | POA: Diagnosis not present

## 2018-09-09 DIAGNOSIS — Z8546 Personal history of malignant neoplasm of prostate: Secondary | ICD-10-CM | POA: Diagnosis not present

## 2018-09-09 DIAGNOSIS — R42 Dizziness and giddiness: Secondary | ICD-10-CM | POA: Diagnosis not present

## 2018-09-09 DIAGNOSIS — Z7901 Long term (current) use of anticoagulants: Secondary | ICD-10-CM | POA: Diagnosis not present

## 2018-09-09 DIAGNOSIS — I493 Ventricular premature depolarization: Secondary | ICD-10-CM | POA: Diagnosis not present

## 2018-09-09 DIAGNOSIS — E1159 Type 2 diabetes mellitus with other circulatory complications: Secondary | ICD-10-CM | POA: Diagnosis not present

## 2018-09-09 DIAGNOSIS — I110 Hypertensive heart disease with heart failure: Secondary | ICD-10-CM | POA: Diagnosis not present

## 2018-09-09 DIAGNOSIS — Z950 Presence of cardiac pacemaker: Secondary | ICD-10-CM | POA: Diagnosis not present

## 2018-09-09 DIAGNOSIS — I951 Orthostatic hypotension: Secondary | ICD-10-CM | POA: Diagnosis not present

## 2018-09-25 DIAGNOSIS — Z45018 Encounter for adjustment and management of other part of cardiac pacemaker: Secondary | ICD-10-CM | POA: Diagnosis not present

## 2018-09-25 DIAGNOSIS — I495 Sick sinus syndrome: Secondary | ICD-10-CM

## 2018-09-25 HISTORY — DX: Sick sinus syndrome: I49.5

## 2018-09-26 DIAGNOSIS — C61 Malignant neoplasm of prostate: Secondary | ICD-10-CM | POA: Diagnosis not present

## 2018-09-29 DIAGNOSIS — Z01818 Encounter for other preprocedural examination: Secondary | ICD-10-CM | POA: Diagnosis not present

## 2018-09-29 DIAGNOSIS — H25812 Combined forms of age-related cataract, left eye: Secondary | ICD-10-CM | POA: Diagnosis not present

## 2018-09-29 DIAGNOSIS — E119 Type 2 diabetes mellitus without complications: Secondary | ICD-10-CM | POA: Diagnosis not present

## 2018-10-10 DIAGNOSIS — Z96641 Presence of right artificial hip joint: Secondary | ICD-10-CM | POA: Diagnosis not present

## 2018-10-10 DIAGNOSIS — H40039 Anatomical narrow angle, unspecified eye: Secondary | ICD-10-CM | POA: Diagnosis not present

## 2018-10-10 DIAGNOSIS — Z8719 Personal history of other diseases of the digestive system: Secondary | ICD-10-CM | POA: Diagnosis not present

## 2018-10-10 DIAGNOSIS — N400 Enlarged prostate without lower urinary tract symptoms: Secondary | ICD-10-CM | POA: Diagnosis not present

## 2018-10-10 DIAGNOSIS — H353131 Nonexudative age-related macular degeneration, bilateral, early dry stage: Secondary | ICD-10-CM | POA: Diagnosis not present

## 2018-10-10 DIAGNOSIS — E1136 Type 2 diabetes mellitus with diabetic cataract: Secondary | ICD-10-CM | POA: Diagnosis not present

## 2018-10-10 DIAGNOSIS — I509 Heart failure, unspecified: Secondary | ICD-10-CM | POA: Diagnosis not present

## 2018-10-10 DIAGNOSIS — I712 Thoracic aortic aneurysm, without rupture: Secondary | ICD-10-CM | POA: Diagnosis not present

## 2018-10-10 DIAGNOSIS — H25812 Combined forms of age-related cataract, left eye: Secondary | ICD-10-CM | POA: Diagnosis not present

## 2018-10-10 DIAGNOSIS — H259 Unspecified age-related cataract: Secondary | ICD-10-CM | POA: Diagnosis not present

## 2018-10-10 DIAGNOSIS — I2510 Atherosclerotic heart disease of native coronary artery without angina pectoris: Secondary | ICD-10-CM | POA: Diagnosis not present

## 2018-10-10 DIAGNOSIS — Z8673 Personal history of transient ischemic attack (TIA), and cerebral infarction without residual deficits: Secondary | ICD-10-CM | POA: Diagnosis not present

## 2018-10-10 DIAGNOSIS — I110 Hypertensive heart disease with heart failure: Secondary | ICD-10-CM | POA: Diagnosis not present

## 2018-10-10 DIAGNOSIS — I4891 Unspecified atrial fibrillation: Secondary | ICD-10-CM | POA: Diagnosis not present

## 2018-10-10 DIAGNOSIS — E113293 Type 2 diabetes mellitus with mild nonproliferative diabetic retinopathy without macular edema, bilateral: Secondary | ICD-10-CM | POA: Diagnosis not present

## 2018-10-18 DIAGNOSIS — E785 Hyperlipidemia, unspecified: Secondary | ICD-10-CM | POA: Diagnosis not present

## 2018-10-18 DIAGNOSIS — E1165 Type 2 diabetes mellitus with hyperglycemia: Secondary | ICD-10-CM | POA: Diagnosis not present

## 2018-10-18 DIAGNOSIS — I2699 Other pulmonary embolism without acute cor pulmonale: Secondary | ICD-10-CM | POA: Diagnosis not present

## 2018-10-18 DIAGNOSIS — I10 Essential (primary) hypertension: Secondary | ICD-10-CM | POA: Diagnosis not present

## 2018-10-18 DIAGNOSIS — I6990 Unspecified sequelae of unspecified cerebrovascular disease: Secondary | ICD-10-CM | POA: Diagnosis not present

## 2018-10-18 DIAGNOSIS — D638 Anemia in other chronic diseases classified elsewhere: Secondary | ICD-10-CM | POA: Diagnosis not present

## 2018-10-18 DIAGNOSIS — E1129 Type 2 diabetes mellitus with other diabetic kidney complication: Secondary | ICD-10-CM | POA: Diagnosis not present

## 2018-10-20 DIAGNOSIS — Z45018 Encounter for adjustment and management of other part of cardiac pacemaker: Secondary | ICD-10-CM | POA: Diagnosis not present

## 2018-10-20 DIAGNOSIS — I495 Sick sinus syndrome: Secondary | ICD-10-CM | POA: Diagnosis not present

## 2018-10-20 DIAGNOSIS — Z952 Presence of prosthetic heart valve: Secondary | ICD-10-CM | POA: Diagnosis not present

## 2018-10-21 DIAGNOSIS — I499 Cardiac arrhythmia, unspecified: Secondary | ICD-10-CM | POA: Diagnosis not present

## 2018-11-07 ENCOUNTER — Telehealth: Payer: Self-pay | Admitting: Cardiology

## 2018-11-07 NOTE — Telephone Encounter (Signed)
No ans, No VM when I Called pt to reschedule appt due to [place] being in [place], not [place]

## 2018-11-22 DIAGNOSIS — H524 Presbyopia: Secondary | ICD-10-CM | POA: Diagnosis not present

## 2018-11-23 ENCOUNTER — Ambulatory Visit: Payer: PPO | Admitting: Cardiology

## 2018-11-28 NOTE — Progress Notes (Signed)
Cardiology Office Note:    Date:  11/29/2018   ID:  Jonathan Ryan, DOB 1929-04-09, MRN [id number]  PCP:  Nicoletta Dress, MD  Cardiologist:  Shirlee More, MD    Referring MD: Nicoletta Dress, MD    ASSESSMENT:    1. Chronic orthostatic hypotension   2. Chronic diastolic heart failure ([place])   3. S/P aortic valve replacement with bioprosthetic valve   4. Mild CAD   5. Hypertensive heart disease with heart failure ([place])   6. Mixed hyperlipidemia    PLAN:    In order of problems listed above:  1. Improved continue his midodrine 2. Stable compensators continue his current diuretic 3. Stable he has had recent echocardiogram 4. Stable asymptomatic New York Heart Association class I having no anginal discomfort 5. Improved he has adequate blood pressure to function and has not had significant hypertension with his alpha agonist. 6. Stable continue his high intensity statin   Next appointment: 6 months he will follow-up with EP [facility]   Medication Adjustments/Labs and Tests Ordered: Current medicines are reviewed at length with the patient today.  Concerns regarding medicines are outlined above.  No orders of the defined types were placed in this encounter.  No orders of the defined types were placed in this encounter.   Chief Complaint  Patient presents with  . Follow-up  . Hypotension    History of Present Illness:    Jonathan Ryan is a 85 y.o. male with a hx of non obstructive CAD, AS s/p AVR (2013), HF, HLD, Dm2, thoracic aneurysmal disease (complex thoracic aortic aneurysm followed by specialist at [facility]. He was last seen 08/17/18. He was been recently evaluated for pre-syncope and orthostatic hypotension.   TTE April 2017 at [facility] with EF 55% and normal AVR fnction P/M 22/12 mm Hg. Seen by Dr. Curt Bears EP 10/2015 for presyncope with normal 30 day monitor. Echo at [facility] 02/13/18 with normal AV bioprosthesis and normal EF - 30 day monitor  with no sustained arrthymia and no bradycardic events.  An episode of rapid heart rhythm at home captured rates greater than 160 called an ambulance was brought to [facility] and had EP ablation of AV nodal reentrant tachycardia subsequent bradycardia and permanent pacemaker.  Since that time he is somewhat better he has had no further spells but he just complains of being diffusely weak he is on midodrine that he takes twice a day and has had no orthostatic hypotension.  No edema shortness of breath chest pain.  His anticoagulant was withdrawn by EP.  Recent labs from his PCP office 10/18/2018 cholesterol 125 LDL 70 HDL 43 A1c 7.8 creatinine 0.9  Compliance with diet, lifestyle and medications: Yes Past Medical History:  Diagnosis Date  . Aortic valve disorder 06/11/2015  . Arthritis   . Blood transfusion without reported diagnosis   . CAD (coronary artery disease)   . Carotid artery disease ([place]) 06/11/2015  . Cataract   . CHF (congestive heart failure) ([place])   . Colostomy in place [facility]) 06/11/2015  . Coronary artery disease   . Coronary artery disease involving native coronary artery of native heart without angina pectoris 01/06/2015  . Descending thoracic aortic aneurysm ([place])   . Diabetes mellitus without complication ([place])   . Diverticular disease of left colon   . Emphysema of lung ([place])    as a child  . GERD (gastroesophageal reflux disease)   . HTN (  hypertension) 06/11/2015  . Hyperlipidemia 01/06/2015  . Hypertension   . Near syncope 06/10/2015  . Pre-syncope 06/10/2015  . Pulmonary embolism ([place])   . Right hip pain 12/15/2016   Overview:  Added automatically from request for surgery [id number] Overview:  Added automatically from request for surgery [id number]  . S/P aortic valve replacement with bioprosthetic valve 01/06/2015  . Statin intolerance 01/06/2015  . Stroke ([place])    08/01/2015  . Thoracic aortic aneurysm without rupture ([place]) 01/06/2015  . Thoracic ascending aortic  aneurysm ([place])   . Thyroid disease    reports nodules  . Type 2 diabetes mellitus with complication ([place]) 0/0/8676  . Vertebral artery stenosis     Past Surgical History:  Procedure Laterality Date  . AORTIC VALVE REPAIR    . COLON SURGERY    . HERNIA REPAIR    . SMALL INTESTINE SURGERY      Current Medications: Current Meds  Medication Sig  . aspirin 325 MG EC tablet Take 325 mg by mouth daily.  Marland Kitchen atorvastatin (LIPITOR) 20 MG tablet Take 1 tablet by mouth. once weekly  . Cholecalciferol (VITAMIN D3) 1000 units CAPS Take 1 capsule by mouth 2 (two) times daily.   . finasteride (PROSCAR) 5 MG tablet Take 5 mg by mouth daily.  . furosemide (LASIX) 40 MG tablet Take 20 mg by mouth daily.   . insulin detemir (LEVEMIR) 100 UNIT/ML injection Inject 40 Units into the skin at bedtime.   . metFORMIN (GLUCOPHAGE) 1000 MG tablet Take 1,000 mg by mouth 2 (two) times daily with a meal.   . metoprolol succinate (TOPROL-XL) 25 MG 24 hr tablet Take 12.5 mg by mouth daily. Only takes if BP is high per patient  . midodrine (PROAMATINE) 5 MG tablet Take 2.5 mg by mouth 3 (three) times daily with meals.  . Multiple Vitamin (MULTI-VITAMINS) TABS Take 1 tablet by mouth daily.   . Nutritional Supplements (PROSTA VITE PO) Take 1 tablet by mouth daily.  Marland Kitchen selenium 50 MCG TABS tablet Take 50 mcg by mouth daily.  Marland Kitchen zinc gluconate 50 MG tablet Take 50 mg by mouth daily.     Allergies:   Penicillins, Ramipril, Vancomycin, and Oxycodone   Social History   Socioeconomic History  . Marital status: Widowed    Spouse name: Not on file  . Number of children: Not on file  . Years of education: Not on file  . Highest education level: Not on file  Occupational History  . Not on file  Social Needs  . Financial resource strain: Not on file  . Food insecurity    Worry: Not on file    Inability: Not on file  . Transportation needs    Medical: Not on file    Non-medical: Not on file  Tobacco Use  . Smoking  status: Former Smoker    Types: Cigarettes  . Smokeless tobacco: Never Used  . Tobacco comment: Smoked 2 packs/ week as a teenager   Substance and Sexual Activity  . Alcohol use: No    Alcohol/week: 0.0 standard drinks  . Drug use: No  . Sexual activity: Not on file  Lifestyle  . Physical activity    Days per week: Not on file    Minutes per session: Not on file  . Stress: Not on file  Relationships  . Social Herbalist on phone: Not on file    Gets together: Not on file    Attends religious service: Not  on file    Active member of club or organization: Not on file    Attends meetings of clubs or organizations: Not on file    Relationship status: Not on file  Other Topics Concern  . Not on file  Social History Narrative  . Not on file     Family History: The patient's family history includes Bladder Cancer in his brother; Breast cancer in his sister; Heart attack in his father; Heart disease in his father. ROS:   Please see the history of present illness.    All other systems reviewed and are negative.  EKGs/Labs/Other Studies Reviewed:    The following studies were reviewed today  Recent Labs: No results found for requested labs within last 8760 hours.  Recent Lipid Panel No results found for: CHOL, TRIG, HDL, CHOLHDL, VLDL, LDLCALC, LDLDIRECT  Physical Exam:    VS:  BP 130/78 (BP Location: Right Arm, Patient Position: Sitting, Cuff Size: Normal)   Pulse 80   Temp 97.7 F (36.5 C)   Wt 182 lb (82.6 kg)   SpO2 97%   BMI 26.11 kg/m     Wt Readings from Last 3 Encounters:  11/29/18 182 lb (82.6 kg)  08/17/18 173 lb 3.2 oz (78.6 kg)  07/25/18 170 lb (77.1 kg)     GEN: He appears frail well nourished, well developed in no acute distress HEENT: Normal NECK: No JVD; No carotid bruits LYMPHATICS: No lymphadenopathy CARDIAC: 2 of 6 grunting midsystolic murmur aortic area no AR RRR, no , rubs, gallops RESPIRATORY:  Clear to auscultation without rales,  wheezing or rhonchi  ABDOMEN: Soft, non-tender, non-distended MUSCULOSKELETAL:  No edema; No deformity  SKIN: Warm and dry NEUROLOGIC:  Alert and oriented x 3 PSYCHIATRIC:  Normal affect    Signed, Shirlee More, MD  11/29/2018 3:09 PM    [facility]

## 2018-11-29 ENCOUNTER — Other Ambulatory Visit: Payer: Self-pay

## 2018-11-29 ENCOUNTER — Encounter: Payer: Self-pay | Admitting: Cardiology

## 2018-11-29 ENCOUNTER — Ambulatory Visit (INDEPENDENT_AMBULATORY_CARE_PROVIDER_SITE_OTHER): Payer: PPO | Admitting: Cardiology

## 2018-11-29 VITALS — BP 130/78 | HR 80 | Temp 97.7°F | Wt 182.0 lb

## 2018-11-29 DIAGNOSIS — I951 Orthostatic hypotension: Secondary | ICD-10-CM | POA: Diagnosis not present

## 2018-11-29 DIAGNOSIS — I2510 Atherosclerotic heart disease of native coronary artery without angina pectoris: Secondary | ICD-10-CM

## 2018-11-29 DIAGNOSIS — Z953 Presence of xenogenic heart valve: Secondary | ICD-10-CM | POA: Diagnosis not present

## 2018-11-29 DIAGNOSIS — E782 Mixed hyperlipidemia: Secondary | ICD-10-CM

## 2018-11-29 DIAGNOSIS — I5032 Chronic diastolic (congestive) heart failure: Secondary | ICD-10-CM | POA: Diagnosis not present

## 2018-11-29 DIAGNOSIS — I110 Hypertensive heart disease with heart failure: Secondary | ICD-10-CM

## 2018-11-29 NOTE — Patient Instructions (Signed)

## 2018-12-25 DIAGNOSIS — R6889 Other general symptoms and signs: Secondary | ICD-10-CM | POA: Diagnosis not present

## 2018-12-25 DIAGNOSIS — U071 COVID-19: Secondary | ICD-10-CM | POA: Diagnosis not present

## 2019-01-24 DIAGNOSIS — I6990 Unspecified sequelae of unspecified cerebrovascular disease: Secondary | ICD-10-CM | POA: Diagnosis not present

## 2019-01-24 DIAGNOSIS — H6121 Impacted cerumen, right ear: Secondary | ICD-10-CM | POA: Diagnosis not present

## 2019-01-24 DIAGNOSIS — E1129 Type 2 diabetes mellitus with other diabetic kidney complication: Secondary | ICD-10-CM | POA: Diagnosis not present

## 2019-01-24 DIAGNOSIS — E785 Hyperlipidemia, unspecified: Secondary | ICD-10-CM | POA: Diagnosis not present

## 2019-01-24 DIAGNOSIS — D638 Anemia in other chronic diseases classified elsewhere: Secondary | ICD-10-CM | POA: Diagnosis not present

## 2019-01-24 DIAGNOSIS — Z2821 Immunization not carried out because of patient refusal: Secondary | ICD-10-CM | POA: Diagnosis not present

## 2019-01-24 DIAGNOSIS — E1165 Type 2 diabetes mellitus with hyperglycemia: Secondary | ICD-10-CM | POA: Diagnosis not present

## 2019-01-24 DIAGNOSIS — Z86711 Personal history of pulmonary embolism: Secondary | ICD-10-CM | POA: Diagnosis not present

## 2019-01-24 DIAGNOSIS — Z23 Encounter for immunization: Secondary | ICD-10-CM | POA: Diagnosis not present

## 2019-01-24 DIAGNOSIS — I10 Essential (primary) hypertension: Secondary | ICD-10-CM | POA: Diagnosis not present

## 2019-01-29 DIAGNOSIS — R6889 Other general symptoms and signs: Secondary | ICD-10-CM | POA: Diagnosis not present

## 2019-01-29 DIAGNOSIS — U071 COVID-19: Secondary | ICD-10-CM | POA: Diagnosis not present

## 2019-02-01 DIAGNOSIS — Z952 Presence of prosthetic heart valve: Secondary | ICD-10-CM | POA: Diagnosis not present

## 2019-02-01 DIAGNOSIS — Z950 Presence of cardiac pacemaker: Secondary | ICD-10-CM | POA: Diagnosis not present

## 2019-02-01 DIAGNOSIS — Z794 Long term (current) use of insulin: Secondary | ICD-10-CM | POA: Diagnosis not present

## 2019-02-01 DIAGNOSIS — R05 Cough: Secondary | ICD-10-CM | POA: Diagnosis not present

## 2019-02-01 DIAGNOSIS — Z79899 Other long term (current) drug therapy: Secondary | ICD-10-CM | POA: Diagnosis not present

## 2019-02-01 DIAGNOSIS — E1165 Type 2 diabetes mellitus with hyperglycemia: Secondary | ICD-10-CM | POA: Diagnosis not present

## 2019-02-01 DIAGNOSIS — R011 Cardiac murmur, unspecified: Secondary | ICD-10-CM | POA: Diagnosis not present

## 2019-02-01 DIAGNOSIS — G459 Transient cerebral ischemic attack, unspecified: Secondary | ICD-10-CM | POA: Diagnosis not present

## 2019-02-01 DIAGNOSIS — R531 Weakness: Secondary | ICD-10-CM | POA: Diagnosis not present

## 2019-02-01 DIAGNOSIS — I2510 Atherosclerotic heart disease of native coronary artery without angina pectoris: Secondary | ICD-10-CM | POA: Diagnosis not present

## 2019-02-01 DIAGNOSIS — I110 Hypertensive heart disease with heart failure: Secondary | ICD-10-CM | POA: Diagnosis not present

## 2019-02-01 DIAGNOSIS — Z7902 Long term (current) use of antithrombotics/antiplatelets: Secondary | ICD-10-CM | POA: Diagnosis not present

## 2019-02-01 DIAGNOSIS — R200 Anesthesia of skin: Secondary | ICD-10-CM | POA: Diagnosis not present

## 2019-02-01 DIAGNOSIS — R41841 Cognitive communication deficit: Secondary | ICD-10-CM | POA: Diagnosis not present

## 2019-02-01 DIAGNOSIS — Z7982 Long term (current) use of aspirin: Secondary | ICD-10-CM | POA: Diagnosis not present

## 2019-02-01 DIAGNOSIS — E876 Hypokalemia: Secondary | ICD-10-CM | POA: Diagnosis not present

## 2019-02-01 DIAGNOSIS — Z803 Family history of malignant neoplasm of breast: Secondary | ICD-10-CM | POA: Diagnosis not present

## 2019-02-01 DIAGNOSIS — I509 Heart failure, unspecified: Secondary | ICD-10-CM | POA: Diagnosis not present

## 2019-02-01 DIAGNOSIS — E785 Hyperlipidemia, unspecified: Secondary | ICD-10-CM | POA: Diagnosis not present

## 2019-02-02 DIAGNOSIS — Z794 Long term (current) use of insulin: Secondary | ICD-10-CM | POA: Diagnosis not present

## 2019-02-02 DIAGNOSIS — I509 Heart failure, unspecified: Secondary | ICD-10-CM | POA: Diagnosis not present

## 2019-02-02 DIAGNOSIS — R200 Anesthesia of skin: Secondary | ICD-10-CM | POA: Diagnosis not present

## 2019-02-02 DIAGNOSIS — G459 Transient cerebral ischemic attack, unspecified: Secondary | ICD-10-CM | POA: Diagnosis not present

## 2019-02-02 DIAGNOSIS — E1165 Type 2 diabetes mellitus with hyperglycemia: Secondary | ICD-10-CM | POA: Diagnosis not present

## 2019-02-02 DIAGNOSIS — R008 Other abnormalities of heart beat: Secondary | ICD-10-CM | POA: Diagnosis not present

## 2019-02-02 DIAGNOSIS — I2510 Atherosclerotic heart disease of native coronary artery without angina pectoris: Secondary | ICD-10-CM | POA: Diagnosis not present

## 2019-02-02 DIAGNOSIS — E785 Hyperlipidemia, unspecified: Secondary | ICD-10-CM | POA: Diagnosis not present

## 2019-02-02 DIAGNOSIS — I110 Hypertensive heart disease with heart failure: Secondary | ICD-10-CM | POA: Diagnosis not present

## 2019-02-03 DIAGNOSIS — G459 Transient cerebral ischemic attack, unspecified: Secondary | ICD-10-CM | POA: Diagnosis not present

## 2019-02-03 DIAGNOSIS — I2510 Atherosclerotic heart disease of native coronary artery without angina pectoris: Secondary | ICD-10-CM | POA: Diagnosis not present

## 2019-02-03 DIAGNOSIS — I110 Hypertensive heart disease with heart failure: Secondary | ICD-10-CM | POA: Diagnosis not present

## 2019-02-03 DIAGNOSIS — I509 Heart failure, unspecified: Secondary | ICD-10-CM | POA: Diagnosis not present

## 2019-02-03 DIAGNOSIS — E119 Type 2 diabetes mellitus without complications: Secondary | ICD-10-CM | POA: Diagnosis not present

## 2019-02-03 DIAGNOSIS — E785 Hyperlipidemia, unspecified: Secondary | ICD-10-CM | POA: Diagnosis not present

## 2019-02-03 DIAGNOSIS — E876 Hypokalemia: Secondary | ICD-10-CM

## 2019-02-03 HISTORY — DX: Hypokalemia: E87.6

## 2019-02-06 DIAGNOSIS — M1990 Unspecified osteoarthritis, unspecified site: Secondary | ICD-10-CM | POA: Diagnosis not present

## 2019-02-06 DIAGNOSIS — I110 Hypertensive heart disease with heart failure: Secondary | ICD-10-CM | POA: Diagnosis not present

## 2019-02-06 DIAGNOSIS — Z87891 Personal history of nicotine dependence: Secondary | ICD-10-CM | POA: Diagnosis not present

## 2019-02-06 DIAGNOSIS — E785 Hyperlipidemia, unspecified: Secondary | ICD-10-CM | POA: Diagnosis not present

## 2019-02-06 DIAGNOSIS — Z794 Long term (current) use of insulin: Secondary | ICD-10-CM | POA: Diagnosis not present

## 2019-02-06 DIAGNOSIS — Z952 Presence of prosthetic heart valve: Secondary | ICD-10-CM | POA: Diagnosis not present

## 2019-02-06 DIAGNOSIS — Z8546 Personal history of malignant neoplasm of prostate: Secondary | ICD-10-CM | POA: Diagnosis not present

## 2019-02-06 DIAGNOSIS — N400 Enlarged prostate without lower urinary tract symptoms: Secondary | ICD-10-CM | POA: Diagnosis not present

## 2019-02-06 DIAGNOSIS — Z7902 Long term (current) use of antithrombotics/antiplatelets: Secondary | ICD-10-CM | POA: Diagnosis not present

## 2019-02-06 DIAGNOSIS — I2510 Atherosclerotic heart disease of native coronary artery without angina pectoris: Secondary | ICD-10-CM | POA: Diagnosis not present

## 2019-02-06 DIAGNOSIS — E1165 Type 2 diabetes mellitus with hyperglycemia: Secondary | ICD-10-CM | POA: Diagnosis not present

## 2019-02-06 DIAGNOSIS — I5032 Chronic diastolic (congestive) heart failure: Secondary | ICD-10-CM | POA: Diagnosis not present

## 2019-02-06 DIAGNOSIS — Z8673 Personal history of transient ischemic attack (TIA), and cerebral infarction without residual deficits: Secondary | ICD-10-CM | POA: Diagnosis not present

## 2019-02-07 DIAGNOSIS — Z713 Dietary counseling and surveillance: Secondary | ICD-10-CM | POA: Diagnosis not present

## 2019-02-07 DIAGNOSIS — E1129 Type 2 diabetes mellitus with other diabetic kidney complication: Secondary | ICD-10-CM | POA: Diagnosis not present

## 2019-02-07 DIAGNOSIS — Z6827 Body mass index (BMI) 27.0-27.9, adult: Secondary | ICD-10-CM | POA: Diagnosis not present

## 2019-02-07 DIAGNOSIS — E119 Type 2 diabetes mellitus without complications: Secondary | ICD-10-CM | POA: Diagnosis not present

## 2019-02-08 DIAGNOSIS — R531 Weakness: Secondary | ICD-10-CM | POA: Diagnosis not present

## 2019-02-08 DIAGNOSIS — G459 Transient cerebral ischemic attack, unspecified: Secondary | ICD-10-CM | POA: Diagnosis not present

## 2019-02-08 DIAGNOSIS — E1165 Type 2 diabetes mellitus with hyperglycemia: Secondary | ICD-10-CM | POA: Diagnosis not present

## 2019-02-08 DIAGNOSIS — R05 Cough: Secondary | ICD-10-CM | POA: Diagnosis not present

## 2019-02-08 DIAGNOSIS — E1129 Type 2 diabetes mellitus with other diabetic kidney complication: Secondary | ICD-10-CM | POA: Diagnosis not present

## 2019-02-13 DIAGNOSIS — Z794 Long term (current) use of insulin: Secondary | ICD-10-CM | POA: Diagnosis not present

## 2019-02-13 DIAGNOSIS — Z952 Presence of prosthetic heart valve: Secondary | ICD-10-CM | POA: Diagnosis not present

## 2019-02-13 DIAGNOSIS — E1165 Type 2 diabetes mellitus with hyperglycemia: Secondary | ICD-10-CM | POA: Diagnosis not present

## 2019-02-13 DIAGNOSIS — Z8546 Personal history of malignant neoplasm of prostate: Secondary | ICD-10-CM | POA: Diagnosis not present

## 2019-02-13 DIAGNOSIS — Z87891 Personal history of nicotine dependence: Secondary | ICD-10-CM | POA: Diagnosis not present

## 2019-02-13 DIAGNOSIS — I5032 Chronic diastolic (congestive) heart failure: Secondary | ICD-10-CM | POA: Diagnosis not present

## 2019-02-13 DIAGNOSIS — I2510 Atherosclerotic heart disease of native coronary artery without angina pectoris: Secondary | ICD-10-CM | POA: Diagnosis not present

## 2019-02-13 DIAGNOSIS — E785 Hyperlipidemia, unspecified: Secondary | ICD-10-CM | POA: Diagnosis not present

## 2019-02-13 DIAGNOSIS — I110 Hypertensive heart disease with heart failure: Secondary | ICD-10-CM | POA: Diagnosis not present

## 2019-02-13 DIAGNOSIS — Z7902 Long term (current) use of antithrombotics/antiplatelets: Secondary | ICD-10-CM | POA: Diagnosis not present

## 2019-02-13 DIAGNOSIS — Z8673 Personal history of transient ischemic attack (TIA), and cerebral infarction without residual deficits: Secondary | ICD-10-CM | POA: Diagnosis not present

## 2019-02-13 DIAGNOSIS — M1990 Unspecified osteoarthritis, unspecified site: Secondary | ICD-10-CM | POA: Diagnosis not present

## 2019-02-13 DIAGNOSIS — N400 Enlarged prostate without lower urinary tract symptoms: Secondary | ICD-10-CM | POA: Diagnosis not present

## 2019-02-22 DIAGNOSIS — Z794 Long term (current) use of insulin: Secondary | ICD-10-CM | POA: Diagnosis not present

## 2019-02-22 DIAGNOSIS — Z952 Presence of prosthetic heart valve: Secondary | ICD-10-CM | POA: Diagnosis not present

## 2019-02-22 DIAGNOSIS — I5032 Chronic diastolic (congestive) heart failure: Secondary | ICD-10-CM | POA: Diagnosis not present

## 2019-02-22 DIAGNOSIS — I2510 Atherosclerotic heart disease of native coronary artery without angina pectoris: Secondary | ICD-10-CM | POA: Diagnosis not present

## 2019-02-22 DIAGNOSIS — E1165 Type 2 diabetes mellitus with hyperglycemia: Secondary | ICD-10-CM | POA: Diagnosis not present

## 2019-02-22 DIAGNOSIS — E785 Hyperlipidemia, unspecified: Secondary | ICD-10-CM | POA: Diagnosis not present

## 2019-02-22 DIAGNOSIS — N400 Enlarged prostate without lower urinary tract symptoms: Secondary | ICD-10-CM | POA: Diagnosis not present

## 2019-02-22 DIAGNOSIS — M1990 Unspecified osteoarthritis, unspecified site: Secondary | ICD-10-CM | POA: Diagnosis not present

## 2019-02-22 DIAGNOSIS — Z87891 Personal history of nicotine dependence: Secondary | ICD-10-CM | POA: Diagnosis not present

## 2019-02-22 DIAGNOSIS — Z8546 Personal history of malignant neoplasm of prostate: Secondary | ICD-10-CM | POA: Diagnosis not present

## 2019-02-22 DIAGNOSIS — Z7902 Long term (current) use of antithrombotics/antiplatelets: Secondary | ICD-10-CM | POA: Diagnosis not present

## 2019-02-22 DIAGNOSIS — Z8673 Personal history of transient ischemic attack (TIA), and cerebral infarction without residual deficits: Secondary | ICD-10-CM | POA: Diagnosis not present

## 2019-02-22 DIAGNOSIS — I110 Hypertensive heart disease with heart failure: Secondary | ICD-10-CM | POA: Diagnosis not present

## 2019-03-29 DIAGNOSIS — C61 Malignant neoplasm of prostate: Secondary | ICD-10-CM | POA: Diagnosis not present

## 2019-03-29 DIAGNOSIS — R351 Nocturia: Secondary | ICD-10-CM | POA: Diagnosis not present

## 2019-04-04 DIAGNOSIS — Z20828 Contact with and (suspected) exposure to other viral communicable diseases: Secondary | ICD-10-CM | POA: Diagnosis not present

## 2019-04-04 DIAGNOSIS — R6889 Other general symptoms and signs: Secondary | ICD-10-CM | POA: Diagnosis not present

## 2019-04-04 DIAGNOSIS — J029 Acute pharyngitis, unspecified: Secondary | ICD-10-CM | POA: Diagnosis not present

## 2019-04-04 DIAGNOSIS — B9689 Other specified bacterial agents as the cause of diseases classified elsewhere: Secondary | ICD-10-CM | POA: Diagnosis not present

## 2019-04-04 DIAGNOSIS — J208 Acute bronchitis due to other specified organisms: Secondary | ICD-10-CM | POA: Diagnosis not present

## 2019-04-06 DIAGNOSIS — R0602 Shortness of breath: Secondary | ICD-10-CM | POA: Diagnosis not present

## 2019-04-06 DIAGNOSIS — R05 Cough: Secondary | ICD-10-CM | POA: Diagnosis not present

## 2019-04-06 DIAGNOSIS — I700 Atherosclerosis of aorta: Secondary | ICD-10-CM | POA: Diagnosis not present

## 2019-04-06 DIAGNOSIS — K8020 Calculus of gallbladder without cholecystitis without obstruction: Secondary | ICD-10-CM | POA: Diagnosis not present

## 2019-04-06 DIAGNOSIS — Z87891 Personal history of nicotine dependence: Secondary | ICD-10-CM | POA: Diagnosis not present

## 2019-04-06 DIAGNOSIS — I712 Thoracic aortic aneurysm, without rupture: Secondary | ICD-10-CM | POA: Diagnosis not present

## 2019-04-06 DIAGNOSIS — E1165 Type 2 diabetes mellitus with hyperglycemia: Secondary | ICD-10-CM | POA: Diagnosis not present

## 2019-04-06 DIAGNOSIS — I4892 Unspecified atrial flutter: Secondary | ICD-10-CM | POA: Diagnosis not present

## 2019-04-08 DIAGNOSIS — Z950 Presence of cardiac pacemaker: Secondary | ICD-10-CM | POA: Diagnosis not present

## 2019-04-08 DIAGNOSIS — I4892 Unspecified atrial flutter: Secondary | ICD-10-CM | POA: Diagnosis not present

## 2019-04-26 DIAGNOSIS — I779 Disorder of arteries and arterioles, unspecified: Secondary | ICD-10-CM | POA: Diagnosis not present

## 2019-04-26 DIAGNOSIS — Z933 Colostomy status: Secondary | ICD-10-CM | POA: Diagnosis not present

## 2019-04-26 DIAGNOSIS — I2699 Other pulmonary embolism without acute cor pulmonale: Secondary | ICD-10-CM | POA: Diagnosis not present

## 2019-04-26 DIAGNOSIS — R29818 Other symptoms and signs involving the nervous system: Secondary | ICD-10-CM | POA: Diagnosis not present

## 2019-04-26 DIAGNOSIS — R5381 Other malaise: Secondary | ICD-10-CM | POA: Diagnosis not present

## 2019-04-26 DIAGNOSIS — I2510 Atherosclerotic heart disease of native coronary artery without angina pectoris: Secondary | ICD-10-CM | POA: Diagnosis not present

## 2019-04-26 DIAGNOSIS — F0390 Unspecified dementia without behavioral disturbance: Secondary | ICD-10-CM | POA: Diagnosis not present

## 2019-04-26 DIAGNOSIS — R270 Ataxia, unspecified: Secondary | ICD-10-CM | POA: Diagnosis not present

## 2019-04-26 DIAGNOSIS — I361 Nonrheumatic tricuspid (valve) insufficiency: Secondary | ICD-10-CM

## 2019-04-26 DIAGNOSIS — E1142 Type 2 diabetes mellitus with diabetic polyneuropathy: Secondary | ICD-10-CM | POA: Diagnosis not present

## 2019-04-26 DIAGNOSIS — Z9181 History of falling: Secondary | ICD-10-CM | POA: Diagnosis not present

## 2019-04-26 DIAGNOSIS — K219 Gastro-esophageal reflux disease without esophagitis: Secondary | ICD-10-CM | POA: Diagnosis not present

## 2019-04-26 DIAGNOSIS — I119 Hypertensive heart disease without heart failure: Secondary | ICD-10-CM | POA: Diagnosis not present

## 2019-04-26 DIAGNOSIS — Z7901 Long term (current) use of anticoagulants: Secondary | ICD-10-CM | POA: Diagnosis not present

## 2019-04-26 DIAGNOSIS — Z950 Presence of cardiac pacemaker: Secondary | ICD-10-CM | POA: Diagnosis not present

## 2019-04-26 DIAGNOSIS — I10 Essential (primary) hypertension: Secondary | ICD-10-CM | POA: Diagnosis not present

## 2019-04-26 DIAGNOSIS — G459 Transient cerebral ischemic attack, unspecified: Secondary | ICD-10-CM | POA: Diagnosis not present

## 2019-04-26 DIAGNOSIS — Z79899 Other long term (current) drug therapy: Secondary | ICD-10-CM | POA: Diagnosis not present

## 2019-04-26 DIAGNOSIS — I6350 Cerebral infarction due to unspecified occlusion or stenosis of unspecified cerebral artery: Secondary | ICD-10-CM | POA: Diagnosis not present

## 2019-04-26 DIAGNOSIS — R29700 NIHSS score 0: Secondary | ICD-10-CM | POA: Diagnosis not present

## 2019-04-26 DIAGNOSIS — N400 Enlarged prostate without lower urinary tract symptoms: Secondary | ICD-10-CM | POA: Diagnosis not present

## 2019-04-26 DIAGNOSIS — E119 Type 2 diabetes mellitus without complications: Secondary | ICD-10-CM | POA: Diagnosis not present

## 2019-04-26 DIAGNOSIS — Z20822 Contact with and (suspected) exposure to covid-19: Secondary | ICD-10-CM | POA: Diagnosis not present

## 2019-04-26 DIAGNOSIS — I6523 Occlusion and stenosis of bilateral carotid arteries: Secondary | ICD-10-CM | POA: Diagnosis not present

## 2019-04-26 DIAGNOSIS — I509 Heart failure, unspecified: Secondary | ICD-10-CM | POA: Diagnosis not present

## 2019-04-27 DIAGNOSIS — I2699 Other pulmonary embolism without acute cor pulmonale: Secondary | ICD-10-CM | POA: Diagnosis not present

## 2019-04-27 DIAGNOSIS — E119 Type 2 diabetes mellitus without complications: Secondary | ICD-10-CM | POA: Diagnosis not present

## 2019-04-27 DIAGNOSIS — I712 Thoracic aortic aneurysm, without rupture: Secondary | ICD-10-CM | POA: Diagnosis not present

## 2019-04-27 DIAGNOSIS — I2510 Atherosclerotic heart disease of native coronary artery without angina pectoris: Secondary | ICD-10-CM | POA: Diagnosis not present

## 2019-04-27 DIAGNOSIS — R531 Weakness: Secondary | ICD-10-CM | POA: Diagnosis not present

## 2019-04-27 DIAGNOSIS — I6350 Cerebral infarction due to unspecified occlusion or stenosis of unspecified cerebral artery: Secondary | ICD-10-CM | POA: Diagnosis not present

## 2019-04-28 DIAGNOSIS — I2699 Other pulmonary embolism without acute cor pulmonale: Secondary | ICD-10-CM | POA: Diagnosis not present

## 2019-04-28 DIAGNOSIS — I6350 Cerebral infarction due to unspecified occlusion or stenosis of unspecified cerebral artery: Secondary | ICD-10-CM | POA: Diagnosis not present

## 2019-04-28 DIAGNOSIS — E119 Type 2 diabetes mellitus without complications: Secondary | ICD-10-CM | POA: Diagnosis not present

## 2019-04-28 DIAGNOSIS — I2510 Atherosclerotic heart disease of native coronary artery without angina pectoris: Secondary | ICD-10-CM | POA: Diagnosis not present

## 2019-04-30 DIAGNOSIS — C61 Malignant neoplasm of prostate: Secondary | ICD-10-CM | POA: Diagnosis not present

## 2019-04-30 DIAGNOSIS — R1031 Right lower quadrant pain: Secondary | ICD-10-CM | POA: Diagnosis not present

## 2019-05-04 DIAGNOSIS — I779 Disorder of arteries and arterioles, unspecified: Secondary | ICD-10-CM | POA: Diagnosis not present

## 2019-05-04 DIAGNOSIS — E785 Hyperlipidemia, unspecified: Secondary | ICD-10-CM | POA: Diagnosis not present

## 2019-05-04 DIAGNOSIS — I6350 Cerebral infarction due to unspecified occlusion or stenosis of unspecified cerebral artery: Secondary | ICD-10-CM | POA: Diagnosis not present

## 2019-05-04 DIAGNOSIS — J208 Acute bronchitis due to other specified organisms: Secondary | ICD-10-CM | POA: Diagnosis not present

## 2019-05-04 DIAGNOSIS — I2699 Other pulmonary embolism without acute cor pulmonale: Secondary | ICD-10-CM | POA: Diagnosis not present

## 2019-05-04 DIAGNOSIS — G459 Transient cerebral ischemic attack, unspecified: Secondary | ICD-10-CM | POA: Diagnosis not present

## 2019-05-04 DIAGNOSIS — B9689 Other specified bacterial agents as the cause of diseases classified elsewhere: Secondary | ICD-10-CM | POA: Diagnosis not present

## 2019-05-08 DIAGNOSIS — M6281 Muscle weakness (generalized): Secondary | ICD-10-CM | POA: Diagnosis not present

## 2019-05-08 DIAGNOSIS — R2681 Unsteadiness on feet: Secondary | ICD-10-CM | POA: Diagnosis not present

## 2019-05-08 DIAGNOSIS — R2689 Other abnormalities of gait and mobility: Secondary | ICD-10-CM | POA: Diagnosis not present

## 2019-05-08 DIAGNOSIS — Z8673 Personal history of transient ischemic attack (TIA), and cerebral infarction without residual deficits: Secondary | ICD-10-CM | POA: Diagnosis not present

## 2019-05-08 DIAGNOSIS — R293 Abnormal posture: Secondary | ICD-10-CM | POA: Diagnosis not present

## 2019-05-15 DIAGNOSIS — R293 Abnormal posture: Secondary | ICD-10-CM | POA: Diagnosis not present

## 2019-05-15 DIAGNOSIS — M6281 Muscle weakness (generalized): Secondary | ICD-10-CM | POA: Diagnosis not present

## 2019-05-15 DIAGNOSIS — Z8673 Personal history of transient ischemic attack (TIA), and cerebral infarction without residual deficits: Secondary | ICD-10-CM | POA: Diagnosis not present

## 2019-05-15 DIAGNOSIS — R2681 Unsteadiness on feet: Secondary | ICD-10-CM | POA: Diagnosis not present

## 2019-05-15 DIAGNOSIS — R2689 Other abnormalities of gait and mobility: Secondary | ICD-10-CM | POA: Diagnosis not present

## 2019-05-21 DIAGNOSIS — Z20822 Contact with and (suspected) exposure to covid-19: Secondary | ICD-10-CM | POA: Diagnosis not present

## 2019-05-21 DIAGNOSIS — R6889 Other general symptoms and signs: Secondary | ICD-10-CM | POA: Diagnosis not present

## 2019-05-21 DIAGNOSIS — J029 Acute pharyngitis, unspecified: Secondary | ICD-10-CM | POA: Diagnosis not present

## 2019-05-29 DIAGNOSIS — Z45018 Encounter for adjustment and management of other part of cardiac pacemaker: Secondary | ICD-10-CM | POA: Diagnosis not present

## 2019-05-29 DIAGNOSIS — G459 Transient cerebral ischemic attack, unspecified: Secondary | ICD-10-CM | POA: Diagnosis not present

## 2019-05-29 DIAGNOSIS — Z9889 Other specified postprocedural states: Secondary | ICD-10-CM | POA: Diagnosis not present

## 2019-05-29 DIAGNOSIS — I4819 Other persistent atrial fibrillation: Secondary | ICD-10-CM | POA: Diagnosis not present

## 2019-05-29 DIAGNOSIS — I495 Sick sinus syndrome: Secondary | ICD-10-CM | POA: Diagnosis not present

## 2019-05-29 DIAGNOSIS — Z8679 Personal history of other diseases of the circulatory system: Secondary | ICD-10-CM | POA: Diagnosis not present

## 2019-05-29 DIAGNOSIS — Z952 Presence of prosthetic heart valve: Secondary | ICD-10-CM | POA: Diagnosis not present

## 2019-05-29 DIAGNOSIS — E1169 Type 2 diabetes mellitus with other specified complication: Secondary | ICD-10-CM | POA: Diagnosis not present

## 2019-05-29 DIAGNOSIS — I712 Thoracic aortic aneurysm, without rupture: Secondary | ICD-10-CM | POA: Diagnosis not present

## 2019-05-29 DIAGNOSIS — I110 Hypertensive heart disease with heart failure: Secondary | ICD-10-CM | POA: Diagnosis not present

## 2019-05-29 DIAGNOSIS — I509 Heart failure, unspecified: Secondary | ICD-10-CM | POA: Diagnosis not present

## 2019-05-29 DIAGNOSIS — E782 Mixed hyperlipidemia: Secondary | ICD-10-CM | POA: Diagnosis not present

## 2019-06-11 DIAGNOSIS — R2681 Unsteadiness on feet: Secondary | ICD-10-CM | POA: Diagnosis not present

## 2019-06-11 DIAGNOSIS — M6281 Muscle weakness (generalized): Secondary | ICD-10-CM | POA: Diagnosis not present

## 2019-06-11 DIAGNOSIS — R2689 Other abnormalities of gait and mobility: Secondary | ICD-10-CM | POA: Diagnosis not present

## 2019-06-11 DIAGNOSIS — R293 Abnormal posture: Secondary | ICD-10-CM | POA: Diagnosis not present

## 2019-06-11 DIAGNOSIS — Z8673 Personal history of transient ischemic attack (TIA), and cerebral infarction without residual deficits: Secondary | ICD-10-CM | POA: Diagnosis not present

## 2019-06-12 DIAGNOSIS — J411 Mucopurulent chronic bronchitis: Secondary | ICD-10-CM | POA: Diagnosis not present

## 2019-06-12 DIAGNOSIS — I719 Aortic aneurysm of unspecified site, without rupture: Secondary | ICD-10-CM | POA: Diagnosis not present

## 2019-06-12 DIAGNOSIS — J42 Unspecified chronic bronchitis: Secondary | ICD-10-CM | POA: Diagnosis not present

## 2019-06-12 DIAGNOSIS — I700 Atherosclerosis of aorta: Secondary | ICD-10-CM | POA: Diagnosis not present

## 2019-06-20 DIAGNOSIS — Z7901 Long term (current) use of anticoagulants: Secondary | ICD-10-CM | POA: Diagnosis not present

## 2019-06-20 DIAGNOSIS — I495 Sick sinus syndrome: Secondary | ICD-10-CM | POA: Diagnosis not present

## 2019-06-20 DIAGNOSIS — I712 Thoracic aortic aneurysm, without rupture: Secondary | ICD-10-CM | POA: Diagnosis not present

## 2019-06-20 DIAGNOSIS — Z950 Presence of cardiac pacemaker: Secondary | ICD-10-CM | POA: Diagnosis not present

## 2019-07-02 ENCOUNTER — Other Ambulatory Visit: Payer: Self-pay

## 2019-07-02 ENCOUNTER — Telehealth: Payer: Self-pay | Admitting: Internal Medicine

## 2019-07-02 ENCOUNTER — Encounter: Payer: Self-pay | Admitting: Internal Medicine

## 2019-07-02 ENCOUNTER — Ambulatory Visit: Payer: PPO | Admitting: Internal Medicine

## 2019-07-02 VITALS — BP 123/80 | HR 70 | Temp 97.3°F | Ht 69.0 in | Wt 177.8 lb

## 2019-07-02 DIAGNOSIS — J411 Mucopurulent chronic bronchitis: Secondary | ICD-10-CM | POA: Diagnosis not present

## 2019-07-02 DIAGNOSIS — J479 Bronchiectasis, uncomplicated: Secondary | ICD-10-CM | POA: Diagnosis not present

## 2019-07-02 MED ORDER — SODIUM CHLORIDE 3 % IN NEBU: INHALATION_SOLUTION | Freq: Two times a day (BID) | RESPIRATORY_TRACT | 12 refills | Status: DC

## 2019-07-02 MED ORDER — FLUTTER DEVI: 1.0000 | 0 refills | Status: DC

## 2019-07-02 NOTE — Progress Notes (Signed)
Jonathan Ryan    NT:[id number]    [birth year]  Primary Care Physician:Schultz, Lora Havens, MD  Referring Physician: Nicoletta Dress, MD [place] [id number] Reason for Consultation: "bronchitis, mucopurulent recurrent s/p TIA - ED dept eval and treat bronchitis as well." Date of Consultation: 07/02/2019  Chief complaint:   Chief Complaint  Patient presents with  . Consult    chrohic bronchitis.  productive cough thick yellow.  sob with exertion.  Hx:  CHF     HPI: Jonathan Ryan with a history of recurrent diverticulitis s/p colostomy, bioprosthetic AVR,  Recent admission for TIA.  Symptoms felt to be secondary to patient taking only half dose Eliquis due to financial reasons.  Also treated for bronchitis with Mucinex and doxycycline.  Recurrent episodes of bronchitis. Multiple courses of antibiotics.  Has daily chronic cough and has difficulty bringing up mucus - feels like he can't bring it up all the way. Cough is worse first thing in the morning.  Has dyspnea at rest and exertion. He has a runny nose and post nasal drainage. No wheezing. No nocturnal cough. Has heart burn occasionally Mucinex does not help the cough. Has been prescribed nebulizer treatments in the past without relief.   Has history of diverticulitis and wears a colostomy bag   Social history:  Occupation: retired, Writer Exposures: lives at home independently, no pets at home.  Smoking history: remote in high school.   Social History   Occupational History  . Not on file  Tobacco Use  . Smoking status: Former Smoker    Years: 0.00    Types: Cigarettes  . Smokeless tobacco: Never Used  . Tobacco comment: Smoked 2 packs/ week as a teenager   Substance and Sexual Activity  . Alcohol use: No    Alcohol/week: 0.0 standard drinks  . Drug use: No  . Sexual activity: Not on file    Relevant family history:  Family History  Problem Relation Age  of Onset  . Heart attack Father   . Heart disease Father   . Breast cancer Sister   . Bladder Cancer Brother     Past Medical History:  Diagnosis Date  . Aortic valve disorder 06/11/2015  . Arthritis   . Blood transfusion without reported diagnosis   . CAD (coronary artery disease)   . Carotid artery disease ([place]) 06/11/2015  . Cataract   . CHF (congestive heart failure) ([place])   . Colostomy in place [facility]) 06/11/2015  . Coronary artery disease   . Coronary artery disease involving native coronary artery of native heart without angina pectoris 01/06/2015  . Descending thoracic aortic aneurysm ([place])   . Diabetes mellitus without complication ([place])   . Diverticular disease of left colon   . Emphysema of lung ([place])    as a child  . GERD (gastroesophageal reflux disease)   . HTN (hypertension) 06/11/2015  . Hyperlipidemia 01/06/2015  . Hypertension   . Near syncope 06/10/2015  . Pre-syncope 06/10/2015  . Pulmonary embolism ([place])   . Right hip pain 12/15/2016   Overview:  Added automatically from request for surgery [id number] Overview:  Added automatically from request for surgery [id number]  . S/P aortic valve replacement with bioprosthetic valve 01/06/2015  . Statin intolerance 01/06/2015  . Stroke ([place])    08/01/2015  . Thoracic aortic aneurysm without rupture ([place]) 01/06/2015  . Thoracic ascending aortic aneurysm ([place])   .  Thyroid disease    reports nodules  . Type 2 diabetes mellitus with complication ([place]) [id number]  . Vertebral artery stenosis     Past Surgical History:  Procedure Laterality Date  . AORTIC VALVE REPAIR    . COLON SURGERY    . HERNIA REPAIR    . SMALL INTESTINE SURGERY       Review of systems: Review of Systems  Constitutional: Negative for chills, fever and weight loss.  HENT: Positive for congestion. Negative for sinus pain and sore throat.   Eyes: Negative for discharge and redness.  Respiratory: Positive for cough and shortness of breath. Negative for hemoptysis,  sputum production and wheezing.   Cardiovascular: Positive for palpitations and leg swelling. Negative for chest pain.  Gastrointestinal: Positive for heartburn. Negative for nausea and vomiting.  Musculoskeletal: Positive for joint pain. Negative for myalgias.  Skin: Negative for rash.  Neurological: Negative for dizziness, tremors, focal weakness and headaches.  Endo/Heme/Allergies: Negative for environmental allergies.  Psychiatric/Behavioral: Negative for depression. The patient is not nervous/anxious.   All other systems reviewed and are negative.   Physical Exam: Blood pressure 123/80, pulse 70, temperature (!) 97.3 F (36.3 C), temperature source Temporal, height 5\' 9"  (1.753 m), weight 177 lb 12.8 oz (80.6 kg), SpO2 98 %. Gen:      No acute distress Lungs:    No increased respiratory effort, symmetric chest wall excursion, clear to auscultation bilaterally, no wheezes or crackles CV:         Regular rate and rhythm; soft systolic murmur, rubs, or gallops.  1+ pitting lower extremity edema MSK: no acute synovitis of DIP or PIP joints, no mechanics hands.  Skin:      Warm and dry; no rashes Neuro: normal speech, HOH    Data Reviewed/Medical Decision Making:  Independent interpretation of tests: Imaging: . Review of patient's CT Angio 2019 images revealed lower lobe bronchiectasis. The patient's images have been independently reviewed by me.   These findings are redemonstrated on a CT chest report April 27, 2019.  Is a 5.9 cm proximal descending thoracic aortic aneurysm.  PFTs:  Labs:  Reviewed from inpatient visit show hyperglycemia, with a hemoglobin A1c of 10.0, hemoglobin of 13.6 Lab Results  Component Value Date   WBC 7.0 06/10/2015   HGB 14.5 06/10/2015   HCT 43.4 06/10/2015   MCV 93.3 06/10/2015   PLT 161 06/10/2015   Lab Results  Component Value Date   NA 141 05/06/2017   K 3.9 05/06/2017   CL 99 05/06/2017   CO2 27 05/06/2017     Immunization status:    There is no immunization history on file for this patient.  . I reviewed prior external note(s) from Dr. Delena Bali, discharge summary from [facility] . I reviewed the result(s) of the labs and imaging as noted above.   Assessment:  Chronic bronchitis Lower lobe predominant bronchiectasis  Plan/Recommendations: Mr. Laton is an 86 year old gentleman with a history of recurrent bronchitis.  His CT findings are consistent with lower lobe predominant bronchiectasis.  He has ongoing symptoms of dyspnea with cough that is difficult to bring up.  The mainstay of therapy for chronic bronchiectasis is airway clearance.  He needs to be on daily scheduled twice a day hypertonic saline nebulizers followed by flutter valve for airway clearance.  We have given him a flutter valve today and instructed him on use.  He is trying to bring up sputum culture so we will send that as well.  We  will also get some spirometry to evaluate for airflow limitation.  Bronchodilator such as albuterol in the nebulizer may be helpful as well.  I do not think there is an immediate need for bronchoscopy, and we can try with sputum cultures first.   Return to Care: Return in about 3 months (around 10/02/2019).  Lenice Llamas, MD Pulmonary and [place]  CC: Nicoletta Dress, MD

## 2019-07-02 NOTE — Patient Instructions (Addendum)
The patient should have follow up scheduled in 3 months. Should be scheduled for spirometry before appointment to review results same day.  Prescribed Nebulizer Machine to be delivered by DME. Hypertonic Saline nebulizer treatments to be given twice a day with flutter valve immediately afterwards.

## 2019-07-02 NOTE — Addendum Note (Signed)
Addended by: Suzzanne Cloud E on: 07/02/2019 12:18 PM   Modules accepted: Orders

## 2019-07-02 NOTE — Telephone Encounter (Signed)
Called and spoke with Patient. Patient stated [facility] did not have neb machine.  Explained neb machine will come from [place] Patient. Patient was not sure if medication was available to [facility].  Advised Patient to call office if neb medication is not available at [facility].

## 2019-07-02 NOTE — Addendum Note (Signed)
Addended by: Vanessa Barbara on: 07/02/2019 12:06 PM   Modules accepted: Orders

## 2019-07-03 DIAGNOSIS — J42 Unspecified chronic bronchitis: Secondary | ICD-10-CM | POA: Diagnosis not present

## 2019-07-05 LAB — RESPIRATORY CULTURE OR RESPIRATORY AND SPUTUM CULTURE
MICRO NUMBER:: 10277192
RESULT:: NORMAL
SPECIMEN QUALITY:: ADEQUATE

## 2019-07-17 DIAGNOSIS — R2689 Other abnormalities of gait and mobility: Secondary | ICD-10-CM | POA: Diagnosis not present

## 2019-07-17 DIAGNOSIS — R293 Abnormal posture: Secondary | ICD-10-CM | POA: Diagnosis not present

## 2019-07-17 DIAGNOSIS — R2681 Unsteadiness on feet: Secondary | ICD-10-CM | POA: Diagnosis not present

## 2019-07-17 DIAGNOSIS — M6281 Muscle weakness (generalized): Secondary | ICD-10-CM | POA: Diagnosis not present

## 2019-07-17 DIAGNOSIS — Z8673 Personal history of transient ischemic attack (TIA), and cerebral infarction without residual deficits: Secondary | ICD-10-CM | POA: Diagnosis not present

## 2019-07-20 DIAGNOSIS — H903 Sensorineural hearing loss, bilateral: Secondary | ICD-10-CM | POA: Diagnosis not present

## 2019-08-02 DIAGNOSIS — I10 Essential (primary) hypertension: Secondary | ICD-10-CM | POA: Diagnosis not present

## 2019-08-02 DIAGNOSIS — Z139 Encounter for screening, unspecified: Secondary | ICD-10-CM | POA: Diagnosis not present

## 2019-08-02 DIAGNOSIS — E1165 Type 2 diabetes mellitus with hyperglycemia: Secondary | ICD-10-CM | POA: Diagnosis not present

## 2019-08-02 DIAGNOSIS — I6990 Unspecified sequelae of unspecified cerebrovascular disease: Secondary | ICD-10-CM | POA: Diagnosis not present

## 2019-08-02 DIAGNOSIS — D638 Anemia in other chronic diseases classified elsewhere: Secondary | ICD-10-CM | POA: Diagnosis not present

## 2019-08-02 DIAGNOSIS — E785 Hyperlipidemia, unspecified: Secondary | ICD-10-CM | POA: Diagnosis not present

## 2019-08-02 DIAGNOSIS — E1129 Type 2 diabetes mellitus with other diabetic kidney complication: Secondary | ICD-10-CM | POA: Diagnosis not present

## 2019-08-02 DIAGNOSIS — Z86711 Personal history of pulmonary embolism: Secondary | ICD-10-CM | POA: Diagnosis not present

## 2019-08-03 DIAGNOSIS — J42 Unspecified chronic bronchitis: Secondary | ICD-10-CM | POA: Diagnosis not present

## 2019-08-03 DIAGNOSIS — H903 Sensorineural hearing loss, bilateral: Secondary | ICD-10-CM | POA: Diagnosis not present

## 2019-08-14 DIAGNOSIS — M6281 Muscle weakness (generalized): Secondary | ICD-10-CM | POA: Diagnosis not present

## 2019-08-14 DIAGNOSIS — R293 Abnormal posture: Secondary | ICD-10-CM | POA: Diagnosis not present

## 2019-08-14 DIAGNOSIS — R2681 Unsteadiness on feet: Secondary | ICD-10-CM | POA: Diagnosis not present

## 2019-08-14 DIAGNOSIS — Z8673 Personal history of transient ischemic attack (TIA), and cerebral infarction without residual deficits: Secondary | ICD-10-CM | POA: Diagnosis not present

## 2019-08-14 DIAGNOSIS — R2689 Other abnormalities of gait and mobility: Secondary | ICD-10-CM | POA: Diagnosis not present

## 2019-08-21 DIAGNOSIS — E785 Hyperlipidemia, unspecified: Secondary | ICD-10-CM | POA: Diagnosis not present

## 2019-08-21 DIAGNOSIS — Z1331 Encounter for screening for depression: Secondary | ICD-10-CM | POA: Diagnosis not present

## 2019-08-21 DIAGNOSIS — Z0000 Encounter for general adult medical examination without abnormal findings: Secondary | ICD-10-CM | POA: Diagnosis not present

## 2019-08-21 DIAGNOSIS — Z9181 History of falling: Secondary | ICD-10-CM | POA: Diagnosis not present

## 2019-09-02 DIAGNOSIS — J42 Unspecified chronic bronchitis: Secondary | ICD-10-CM | POA: Diagnosis not present

## 2019-09-04 ENCOUNTER — Other Ambulatory Visit (HOSPITAL_COMMUNITY): Payer: PPO

## 2019-09-07 ENCOUNTER — Other Ambulatory Visit: Payer: PPO

## 2019-09-07 ENCOUNTER — Ambulatory Visit: Payer: PPO | Admitting: Internal Medicine

## 2019-09-19 ENCOUNTER — Encounter: Payer: Self-pay | Admitting: Adult Health

## 2019-09-21 ENCOUNTER — Other Ambulatory Visit: Payer: Self-pay | Admitting: *Deleted

## 2019-09-21 DIAGNOSIS — J479 Bronchiectasis, uncomplicated: Secondary | ICD-10-CM

## 2019-09-24 DIAGNOSIS — E1122 Type 2 diabetes mellitus with diabetic chronic kidney disease: Secondary | ICD-10-CM | POA: Diagnosis not present

## 2019-09-24 DIAGNOSIS — C61 Malignant neoplasm of prostate: Secondary | ICD-10-CM | POA: Diagnosis not present

## 2019-09-24 DIAGNOSIS — Z7901 Long term (current) use of anticoagulants: Secondary | ICD-10-CM | POA: Diagnosis not present

## 2019-09-24 DIAGNOSIS — E1159 Type 2 diabetes mellitus with other circulatory complications: Secondary | ICD-10-CM | POA: Diagnosis not present

## 2019-09-24 DIAGNOSIS — E1151 Type 2 diabetes mellitus with diabetic peripheral angiopathy without gangrene: Secondary | ICD-10-CM | POA: Diagnosis not present

## 2019-09-24 DIAGNOSIS — E261 Secondary hyperaldosteronism: Secondary | ICD-10-CM | POA: Diagnosis not present

## 2019-09-24 DIAGNOSIS — I70203 Unspecified atherosclerosis of native arteries of extremities, bilateral legs: Secondary | ICD-10-CM | POA: Diagnosis not present

## 2019-09-24 DIAGNOSIS — I480 Paroxysmal atrial fibrillation: Secondary | ICD-10-CM | POA: Diagnosis not present

## 2019-09-24 DIAGNOSIS — Z6826 Body mass index (BMI) 26.0-26.9, adult: Secondary | ICD-10-CM | POA: Diagnosis not present

## 2019-09-24 DIAGNOSIS — I509 Heart failure, unspecified: Secondary | ICD-10-CM | POA: Diagnosis not present

## 2019-09-24 DIAGNOSIS — I729 Aneurysm of unspecified site: Secondary | ICD-10-CM | POA: Diagnosis not present

## 2019-09-24 DIAGNOSIS — D6869 Other thrombophilia: Secondary | ICD-10-CM | POA: Diagnosis not present

## 2019-09-25 ENCOUNTER — Other Ambulatory Visit (HOSPITAL_COMMUNITY): Payer: PPO

## 2019-09-25 ENCOUNTER — Other Ambulatory Visit (HOSPITAL_COMMUNITY)
Admission: RE | Admit: 2019-09-25 | Discharge: 2019-09-25 | Disposition: A | Discharge status: Home / Self Care | Payer: PPO | Source: Ambulatory Visit | Attending: Internal Medicine | Admitting: Internal Medicine

## 2019-09-25 DIAGNOSIS — Z20822 Contact with and (suspected) exposure to covid-19: Secondary | ICD-10-CM | POA: Insufficient documentation

## 2019-09-25 DIAGNOSIS — Z01812 Encounter for preprocedural laboratory examination: Secondary | ICD-10-CM | POA: Insufficient documentation

## 2019-09-26 LAB — SARS CORONAVIRUS 2 (TAT 6-24 HRS): SARS Coronavirus 2: NEGATIVE

## 2019-09-28 ENCOUNTER — Ambulatory Visit: Payer: PPO | Admitting: Adult Health

## 2019-09-28 ENCOUNTER — Other Ambulatory Visit: Payer: PPO

## 2019-10-03 DIAGNOSIS — J42 Unspecified chronic bronchitis: Secondary | ICD-10-CM | POA: Diagnosis not present

## 2019-10-03 DIAGNOSIS — I495 Sick sinus syndrome: Secondary | ICD-10-CM | POA: Diagnosis not present

## 2019-10-05 ENCOUNTER — Other Ambulatory Visit: Payer: Self-pay

## 2019-10-05 ENCOUNTER — Ambulatory Visit (INDEPENDENT_AMBULATORY_CARE_PROVIDER_SITE_OTHER): Payer: PPO

## 2019-10-05 DIAGNOSIS — J479 Bronchiectasis, uncomplicated: Secondary | ICD-10-CM | POA: Diagnosis not present

## 2019-10-08 ENCOUNTER — Encounter: Payer: Self-pay | Admitting: Acute Care

## 2019-10-08 ENCOUNTER — Other Ambulatory Visit: Payer: Self-pay

## 2019-10-08 ENCOUNTER — Ambulatory Visit (INDEPENDENT_AMBULATORY_CARE_PROVIDER_SITE_OTHER): Payer: PPO | Admitting: Acute Care

## 2019-10-08 ENCOUNTER — Telehealth: Payer: Self-pay | Admitting: Acute Care

## 2019-10-08 DIAGNOSIS — J479 Bronchiectasis, uncomplicated: Secondary | ICD-10-CM

## 2019-10-08 MED ORDER — ALBUTEROL SULFATE (2.5 MG/3ML) 0.083% IN NEBU: 2.5000 mg | INHALATION_SOLUTION | Freq: Four times a day (QID) | RESPIRATORY_TRACT | 12 refills | Status: DC | PRN

## 2019-10-08 NOTE — Progress Notes (Signed)
Virtual Visit via Telephone Note  I connected with Jonathan Ryan on 10/08/19 at 12:00 PM EDT by telephone and verified that I am speaking with the correct person using two identifiers.  Location: Patient: At home Provider: Working remotely from home   I discussed the limitations, risks, security and privacy concerns of performing an evaluation and management service by telephone and the availability of in person appointments. I also discussed with the patient that there may be a patient responsible charge related to this service. The patient expressed understanding and agreed to proceed.   History of Present Illness: Pt. Presents for follow up. He states he is doing well.He is very groggy on the phone today.States this is because he had a busy weekend. 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.   He states he has not been compliant with his hypertonic saline nebs twice daily.He states he has been using his flutter valve  after the nebulizer treatments. But again, not twice daily.  He states he is coughing up secretions, but last sputum culture was resulted as normal flora. He states he does feel the hypertonic nebs are helping him to some extent.  We discussed that his spirometry shows some mild obstruction. We will add albuterol nebs to his regimen to mucus clearance. I emphasized how important it was to do the hypertonic saline nebs and flutter valve twice daily.  He was in agreement with adding the albuterol treatments.  The phone call became disconnected near the end of our conversation. I will send an after visit summary with full instructions.. Pt. Denies fever, chest pain, orthopnea or hemoptysis.    Observations/Objective: Office Spirometry Results:  FVC 3.5/3.3/ 95% predicted FEV 1   2.4/1.6/ 65% F/F Ratio  70%/47%/ 67% Read as mild airway obstuction  Imaging: . Review of patient's CT Angio 2019 images revealed lower lobe bronchiectasis. These  findings are redemonstrated on a CT chest report April 27, 2019.   Assessment and Plan: Bronchiectasis Compliant with nebs " most of the time" Plan Continue Hypertonic Saline Twice daily every day without fail Continue Flutter Valve after neb treatments twice daily We will add Albuterol nebs to your regimen. Use in the morning with your hypertonic saline nebs. These can be mixed together and given as one neb treatment.  Follow up with Dr. Shearon Stalls in 2 months>> may need to consider bronch Call the office if you need [place] sooner.  Please contact office for sooner follow up if symptoms do not improve or worsen or seek emergency care   Follow Up Instructions: Follow up with Dr. Shearon Stalls or Judson Roch in 2 months. Please contact office for sooner follow up if symptoms do not improve or worsen or seek emergency care    I discussed the assessment and treatment plan with the patient. The patient was provided an opportunity to ask questions and all were answered. The patient agreed with the plan and demonstrated an understanding of the instructions.   The patient was advised to call back or seek an in-person evaluation if the symptoms worsen or if the condition fails to improve as anticipated.  I provided 31 minutes of non-face-to-face time during this encounter.   Magdalen Spatz, NP 10/08/2019

## 2019-10-08 NOTE — Telephone Encounter (Signed)
Spoke with pharmacy and verified instructions for Albuterol nebulizer.  Nothing further needed.

## 2019-10-08 NOTE — Patient Instructions (Signed)
It was good to talk with you today. Continue Hypertonic Saline Twice daily every day without fail Continue Flutter Valve after neb treatments twice daily This will help with mucus clearance. We will add Albuterol nebs to your regimen. Use in the morning with your hypertonic saline nebs. These can be mixed together and given as one neb treatment.  Follow up with Dr. Shearon Stalls in 2 months Call the office if you need [place] sooner.  Please contact office for sooner follow up if symptoms do not improve or worsen or seek emergency care

## 2019-10-14 DIAGNOSIS — J90 Pleural effusion, not elsewhere classified: Secondary | ICD-10-CM | POA: Diagnosis not present

## 2019-10-14 DIAGNOSIS — I700 Atherosclerosis of aorta: Secondary | ICD-10-CM | POA: Diagnosis not present

## 2019-10-14 DIAGNOSIS — Z8679 Personal history of other diseases of the circulatory system: Secondary | ICD-10-CM | POA: Diagnosis not present

## 2019-10-14 DIAGNOSIS — R0602 Shortness of breath: Secondary | ICD-10-CM | POA: Diagnosis not present

## 2019-10-14 DIAGNOSIS — M25512 Pain in left shoulder: Secondary | ICD-10-CM | POA: Diagnosis not present

## 2019-10-14 DIAGNOSIS — R109 Unspecified abdominal pain: Secondary | ICD-10-CM | POA: Diagnosis not present

## 2019-10-14 DIAGNOSIS — Z87891 Personal history of nicotine dependence: Secondary | ICD-10-CM | POA: Diagnosis not present

## 2019-10-14 DIAGNOSIS — Z79899 Other long term (current) drug therapy: Secondary | ICD-10-CM | POA: Diagnosis not present

## 2019-10-14 DIAGNOSIS — R0789 Other chest pain: Secondary | ICD-10-CM | POA: Diagnosis not present

## 2019-10-14 DIAGNOSIS — I712 Thoracic aortic aneurysm, without rupture: Secondary | ICD-10-CM | POA: Diagnosis not present

## 2019-10-14 DIAGNOSIS — I4892 Unspecified atrial flutter: Secondary | ICD-10-CM | POA: Diagnosis not present

## 2019-10-14 DIAGNOSIS — J479 Bronchiectasis, uncomplicated: Secondary | ICD-10-CM | POA: Diagnosis not present

## 2019-10-14 DIAGNOSIS — Z950 Presence of cardiac pacemaker: Secondary | ICD-10-CM | POA: Diagnosis not present

## 2019-10-14 DIAGNOSIS — Z7901 Long term (current) use of anticoagulants: Secondary | ICD-10-CM | POA: Diagnosis not present

## 2019-10-14 DIAGNOSIS — R079 Chest pain, unspecified: Secondary | ICD-10-CM | POA: Diagnosis not present

## 2019-10-15 DIAGNOSIS — R0602 Shortness of breath: Secondary | ICD-10-CM | POA: Diagnosis not present

## 2019-10-15 DIAGNOSIS — I10 Essential (primary) hypertension: Secondary | ICD-10-CM | POA: Diagnosis not present

## 2019-10-15 DIAGNOSIS — J90 Pleural effusion, not elsewhere classified: Secondary | ICD-10-CM | POA: Diagnosis not present

## 2019-10-15 DIAGNOSIS — E1165 Type 2 diabetes mellitus with hyperglycemia: Secondary | ICD-10-CM | POA: Diagnosis not present

## 2019-10-15 DIAGNOSIS — Z794 Long term (current) use of insulin: Secondary | ICD-10-CM | POA: Diagnosis not present

## 2019-10-15 DIAGNOSIS — I712 Thoracic aortic aneurysm, without rupture: Secondary | ICD-10-CM | POA: Diagnosis not present

## 2019-10-15 DIAGNOSIS — R079 Chest pain, unspecified: Secondary | ICD-10-CM | POA: Diagnosis not present

## 2019-10-16 DIAGNOSIS — R072 Precordial pain: Secondary | ICD-10-CM | POA: Diagnosis not present

## 2019-10-16 DIAGNOSIS — I5032 Chronic diastolic (congestive) heart failure: Secondary | ICD-10-CM | POA: Diagnosis not present

## 2019-10-16 DIAGNOSIS — E119 Type 2 diabetes mellitus without complications: Secondary | ICD-10-CM | POA: Diagnosis not present

## 2019-10-16 DIAGNOSIS — I119 Hypertensive heart disease without heart failure: Secondary | ICD-10-CM | POA: Diagnosis not present

## 2019-10-16 DIAGNOSIS — I2510 Atherosclerotic heart disease of native coronary artery without angina pectoris: Secondary | ICD-10-CM | POA: Diagnosis not present

## 2019-10-16 DIAGNOSIS — R0789 Other chest pain: Secondary | ICD-10-CM | POA: Diagnosis not present

## 2019-10-16 DIAGNOSIS — I712 Thoracic aortic aneurysm, without rupture: Secondary | ICD-10-CM | POA: Diagnosis not present

## 2019-10-16 DIAGNOSIS — R079 Chest pain, unspecified: Secondary | ICD-10-CM | POA: Diagnosis not present

## 2019-10-16 DIAGNOSIS — Z794 Long term (current) use of insulin: Secondary | ICD-10-CM | POA: Diagnosis not present

## 2019-10-16 DIAGNOSIS — I4892 Unspecified atrial flutter: Secondary | ICD-10-CM | POA: Diagnosis not present

## 2019-10-16 DIAGNOSIS — Z7901 Long term (current) use of anticoagulants: Secondary | ICD-10-CM | POA: Diagnosis not present

## 2019-10-16 DIAGNOSIS — Z950 Presence of cardiac pacemaker: Secondary | ICD-10-CM | POA: Diagnosis not present

## 2019-10-18 DIAGNOSIS — Z45018 Encounter for adjustment and management of other part of cardiac pacemaker: Secondary | ICD-10-CM | POA: Diagnosis not present

## 2019-10-18 DIAGNOSIS — B029 Zoster without complications: Secondary | ICD-10-CM | POA: Insufficient documentation

## 2019-10-18 DIAGNOSIS — R0789 Other chest pain: Secondary | ICD-10-CM | POA: Insufficient documentation

## 2019-10-18 DIAGNOSIS — I2510 Atherosclerotic heart disease of native coronary artery without angina pectoris: Secondary | ICD-10-CM | POA: Diagnosis not present

## 2019-10-18 DIAGNOSIS — R079 Chest pain, unspecified: Secondary | ICD-10-CM | POA: Diagnosis not present

## 2019-10-18 DIAGNOSIS — Z952 Presence of prosthetic heart valve: Secondary | ICD-10-CM | POA: Diagnosis not present

## 2019-10-24 DIAGNOSIS — B0229 Other postherpetic nervous system involvement: Secondary | ICD-10-CM | POA: Diagnosis not present

## 2019-11-02 DIAGNOSIS — B0229 Other postherpetic nervous system involvement: Secondary | ICD-10-CM | POA: Diagnosis not present

## 2019-11-02 DIAGNOSIS — J42 Unspecified chronic bronchitis: Secondary | ICD-10-CM | POA: Diagnosis not present

## 2019-11-07 DIAGNOSIS — B0229 Other postherpetic nervous system involvement: Secondary | ICD-10-CM | POA: Diagnosis not present

## 2019-11-26 DIAGNOSIS — B0229 Other postherpetic nervous system involvement: Secondary | ICD-10-CM | POA: Diagnosis not present

## 2019-11-30 DIAGNOSIS — E119 Type 2 diabetes mellitus without complications: Secondary | ICD-10-CM | POA: Diagnosis not present

## 2019-11-30 DIAGNOSIS — H524 Presbyopia: Secondary | ICD-10-CM | POA: Diagnosis not present

## 2019-12-03 DIAGNOSIS — J42 Unspecified chronic bronchitis: Secondary | ICD-10-CM | POA: Diagnosis not present

## 2019-12-13 DIAGNOSIS — H353221 Exudative age-related macular degeneration, left eye, with active choroidal neovascularization: Secondary | ICD-10-CM | POA: Diagnosis not present

## 2019-12-13 DIAGNOSIS — H353112 Nonexudative age-related macular degeneration, right eye, intermediate dry stage: Secondary | ICD-10-CM | POA: Diagnosis not present

## 2019-12-14 DIAGNOSIS — I70203 Unspecified atherosclerosis of native arteries of extremities, bilateral legs: Secondary | ICD-10-CM | POA: Diagnosis not present

## 2019-12-14 DIAGNOSIS — I729 Aneurysm of unspecified site: Secondary | ICD-10-CM | POA: Diagnosis not present

## 2019-12-14 DIAGNOSIS — D6869 Other thrombophilia: Secondary | ICD-10-CM | POA: Diagnosis not present

## 2019-12-14 DIAGNOSIS — C61 Malignant neoplasm of prostate: Secondary | ICD-10-CM | POA: Diagnosis not present

## 2019-12-14 DIAGNOSIS — N189 Chronic kidney disease, unspecified: Secondary | ICD-10-CM | POA: Diagnosis not present

## 2019-12-14 DIAGNOSIS — I480 Paroxysmal atrial fibrillation: Secondary | ICD-10-CM | POA: Diagnosis not present

## 2019-12-14 DIAGNOSIS — E1122 Type 2 diabetes mellitus with diabetic chronic kidney disease: Secondary | ICD-10-CM | POA: Diagnosis not present

## 2019-12-14 DIAGNOSIS — Z6825 Body mass index (BMI) 25.0-25.9, adult: Secondary | ICD-10-CM | POA: Diagnosis not present

## 2019-12-14 DIAGNOSIS — E1151 Type 2 diabetes mellitus with diabetic peripheral angiopathy without gangrene: Secondary | ICD-10-CM | POA: Diagnosis not present

## 2019-12-14 DIAGNOSIS — E261 Secondary hyperaldosteronism: Secondary | ICD-10-CM | POA: Diagnosis not present

## 2019-12-14 DIAGNOSIS — H3530 Unspecified macular degeneration: Secondary | ICD-10-CM | POA: Diagnosis not present

## 2019-12-14 DIAGNOSIS — I509 Heart failure, unspecified: Secondary | ICD-10-CM | POA: Diagnosis not present

## 2020-01-02 DIAGNOSIS — Z950 Presence of cardiac pacemaker: Secondary | ICD-10-CM | POA: Diagnosis not present

## 2020-01-03 DIAGNOSIS — J42 Unspecified chronic bronchitis: Secondary | ICD-10-CM | POA: Diagnosis not present

## 2020-01-15 DIAGNOSIS — Z20828 Contact with and (suspected) exposure to other viral communicable diseases: Secondary | ICD-10-CM | POA: Diagnosis not present

## 2020-01-22 DIAGNOSIS — E1129 Type 2 diabetes mellitus with other diabetic kidney complication: Secondary | ICD-10-CM | POA: Diagnosis not present

## 2020-01-22 DIAGNOSIS — E1165 Type 2 diabetes mellitus with hyperglycemia: Secondary | ICD-10-CM | POA: Diagnosis not present

## 2020-01-29 DIAGNOSIS — Z933 Colostomy status: Secondary | ICD-10-CM | POA: Diagnosis not present

## 2020-01-31 IMAGING — NM NM MISC PROCEDURE
3 series · 18 of 18 positions shown · non-contrast
Comparison: none

[Series 1: wbr_r-proj_st rest_(id)_sa · 6.5mm · 6.51mm/px · 6 of 64 frames shown]
[frame 6/64]
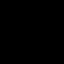
[frame 16/64]
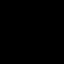
[frame 27/64]
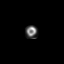
[frame 38/64]
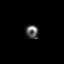
[frame 48/64]
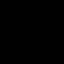
[frame 59/64]
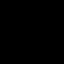

[Series 1: wbr_s-proj_st stress - perfusion_(id)_sa · 6.5mm · 6.51mm/px · 6 of 64 frames shown]
[frame 6/64]
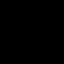
[frame 16/64]
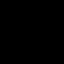
[frame 27/64]
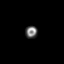
[frame 38/64]
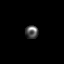
[frame 48/64]
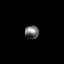
[frame 59/64]
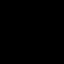

[Series 1: wbr_s-proj_st stress - gated_(id)_sa · 6.5mm · 6.51mm/px · 6 of 512 frames shown]
[frame 43/512]
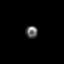
[frame 128/512]
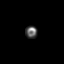
[frame 214/512]
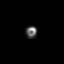
[frame 299/512]
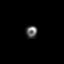
[frame 384/512]
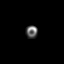
[frame 470/512]
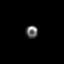

[18 of 18 positions shown; findings below may reference images not displayed]

Canned report from images found in remote index.

Refer to host system for actual result text.

## 2020-02-02 DIAGNOSIS — J42 Unspecified chronic bronchitis: Secondary | ICD-10-CM | POA: Diagnosis not present

## 2020-03-04 DIAGNOSIS — J42 Unspecified chronic bronchitis: Secondary | ICD-10-CM | POA: Diagnosis not present

## 2020-03-21 DIAGNOSIS — I509 Heart failure, unspecified: Secondary | ICD-10-CM | POA: Diagnosis not present

## 2020-03-21 DIAGNOSIS — I70203 Unspecified atherosclerosis of native arteries of extremities, bilateral legs: Secondary | ICD-10-CM | POA: Diagnosis not present

## 2020-03-21 DIAGNOSIS — H3530 Unspecified macular degeneration: Secondary | ICD-10-CM | POA: Diagnosis not present

## 2020-03-21 DIAGNOSIS — E1151 Type 2 diabetes mellitus with diabetic peripheral angiopathy without gangrene: Secondary | ICD-10-CM | POA: Diagnosis not present

## 2020-03-21 DIAGNOSIS — I480 Paroxysmal atrial fibrillation: Secondary | ICD-10-CM | POA: Diagnosis not present

## 2020-03-21 DIAGNOSIS — E261 Secondary hyperaldosteronism: Secondary | ICD-10-CM | POA: Diagnosis not present

## 2020-03-21 DIAGNOSIS — I729 Aneurysm of unspecified site: Secondary | ICD-10-CM | POA: Diagnosis not present

## 2020-03-21 DIAGNOSIS — D6869 Other thrombophilia: Secondary | ICD-10-CM | POA: Diagnosis not present

## 2020-03-21 DIAGNOSIS — Z794 Long term (current) use of insulin: Secondary | ICD-10-CM | POA: Diagnosis not present

## 2020-03-21 DIAGNOSIS — Z7901 Long term (current) use of anticoagulants: Secondary | ICD-10-CM | POA: Diagnosis not present

## 2020-03-21 DIAGNOSIS — C61 Malignant neoplasm of prostate: Secondary | ICD-10-CM | POA: Diagnosis not present

## 2020-03-21 DIAGNOSIS — D692 Other nonthrombocytopenic purpura: Secondary | ICD-10-CM | POA: Diagnosis not present

## 2020-04-02 DIAGNOSIS — Z950 Presence of cardiac pacemaker: Secondary | ICD-10-CM | POA: Diagnosis not present

## 2020-04-03 DIAGNOSIS — J42 Unspecified chronic bronchitis: Secondary | ICD-10-CM | POA: Diagnosis not present

## 2020-04-03 DIAGNOSIS — Z20828 Contact with and (suspected) exposure to other viral communicable diseases: Secondary | ICD-10-CM | POA: Diagnosis not present

## 2020-04-03 DIAGNOSIS — R509 Fever, unspecified: Secondary | ICD-10-CM | POA: Diagnosis not present

## 2020-04-03 DIAGNOSIS — B9789 Other viral agents as the cause of diseases classified elsewhere: Secondary | ICD-10-CM | POA: Diagnosis not present

## 2020-04-03 DIAGNOSIS — R0981 Nasal congestion: Secondary | ICD-10-CM | POA: Diagnosis not present

## 2020-05-04 DIAGNOSIS — J42 Unspecified chronic bronchitis: Secondary | ICD-10-CM | POA: Diagnosis not present

## 2020-05-04 DIAGNOSIS — R051 Acute cough: Secondary | ICD-10-CM | POA: Diagnosis not present

## 2020-05-04 DIAGNOSIS — Z20828 Contact with and (suspected) exposure to other viral communicable diseases: Secondary | ICD-10-CM | POA: Diagnosis not present

## 2020-05-04 DIAGNOSIS — R0602 Shortness of breath: Secondary | ICD-10-CM | POA: Diagnosis not present

## 2020-05-09 ENCOUNTER — Other Ambulatory Visit: Payer: Self-pay

## 2020-05-09 ENCOUNTER — Emergency Department (HOSPITAL_COMMUNITY)
Admission: EM | Admit: 2020-05-09 | Discharge: 2020-05-09 | Disposition: A | Discharge status: Home / Self Care | Payer: PPO | Attending: Emergency Medicine | Admitting: Emergency Medicine

## 2020-05-09 ENCOUNTER — Emergency Department (HOSPITAL_COMMUNITY): Payer: PPO

## 2020-05-09 ENCOUNTER — Encounter (HOSPITAL_COMMUNITY): Payer: Self-pay | Admitting: Emergency Medicine

## 2020-05-09 DIAGNOSIS — U071 COVID-19: Secondary | ICD-10-CM | POA: Insufficient documentation

## 2020-05-09 DIAGNOSIS — Z794 Long term (current) use of insulin: Secondary | ICD-10-CM | POA: Insufficient documentation

## 2020-05-09 DIAGNOSIS — Z7901 Long term (current) use of anticoagulants: Secondary | ICD-10-CM | POA: Diagnosis not present

## 2020-05-09 DIAGNOSIS — Z79899 Other long term (current) drug therapy: Secondary | ICD-10-CM | POA: Diagnosis not present

## 2020-05-09 DIAGNOSIS — I2510 Atherosclerotic heart disease of native coronary artery without angina pectoris: Secondary | ICD-10-CM | POA: Diagnosis not present

## 2020-05-09 DIAGNOSIS — E119 Type 2 diabetes mellitus without complications: Secondary | ICD-10-CM | POA: Insufficient documentation

## 2020-05-09 DIAGNOSIS — Z87891 Personal history of nicotine dependence: Secondary | ICD-10-CM | POA: Diagnosis not present

## 2020-05-09 DIAGNOSIS — R059 Cough, unspecified: Secondary | ICD-10-CM | POA: Diagnosis present

## 2020-05-09 DIAGNOSIS — Z7984 Long term (current) use of oral hypoglycemic drugs: Secondary | ICD-10-CM | POA: Insufficient documentation

## 2020-05-09 DIAGNOSIS — I5032 Chronic diastolic (congestive) heart failure: Secondary | ICD-10-CM | POA: Diagnosis not present

## 2020-05-09 DIAGNOSIS — I110 Hypertensive heart disease with heart failure: Secondary | ICD-10-CM | POA: Insufficient documentation

## 2020-05-09 DIAGNOSIS — Z7982 Long term (current) use of aspirin: Secondary | ICD-10-CM | POA: Diagnosis not present

## 2020-05-09 DIAGNOSIS — R0602 Shortness of breath: Secondary | ICD-10-CM | POA: Diagnosis not present

## 2020-05-09 LAB — POC SARS CORONAVIRUS 2 AG -  ED: SARS Coronavirus 2 Ag: POSITIVE — AB

## 2020-05-09 NOTE — ED Triage Notes (Signed)
Patient states he was sent to ED by his PCP for monoclonal antibody treatment. Patient tested positive for COVID one week ago. SpO2 >95% on room air, no dyspnea.

## 2020-05-09 NOTE — Discharge Instructions (Addendum)
Person Under Monitoring Name: Jonathan Ryan  Location: [place] [address]   Infection Prevention Recommendations for Individuals Confirmed to have, or Being Evaluated for, 2019 Novel Coronavirus (COVID-19) Infection Who Receive Care at Home  Individuals who are confirmed to have, or are being evaluated for, COVID-19 should follow the prevention steps below until a healthcare provider or local or state health department says they can return to normal activities.  Stay home except to get medical care You should restrict activities outside your home, except for getting medical care. Do not go to work, school, or public areas, and do not use public transportation or taxis.  Call ahead before visiting your doctor Before your medical appointment, call the healthcare provider and tell them that you have, or are being evaluated for, COVID-19 infection. This will help the healthcare providers office take steps to keep other people from getting infected. Ask your healthcare provider to call the local or state health department.  Monitor your symptoms Seek prompt medical attention if your illness is worsening (e.g., difficulty breathing). Before going to your medical appointment, call the healthcare provider and tell them that you have, or are being evaluated for, COVID-19 infection. Ask your healthcare provider to call the local or state health department.  Wear a facemask You should wear a facemask that covers your nose and mouth when you are in the same room with other people and when you visit a healthcare provider. People who live with or visit you should also wear a facemask while they are in the same room with you.  Separate yourself from other people in your home As much as possible, you should stay in a different room from other people in your home. Also, you should use a separate bathroom, if available.  Avoid sharing household items You should not share  dishes, drinking glasses, cups, eating utensils, towels, bedding, or other items with other people in your home. After using these items, you should wash them thoroughly with soap and water.  Cover your coughs and sneezes Cover your mouth and nose with a tissue when you cough or sneeze, or you can cough or sneeze into your sleeve. Throw used tissues in a lined trash can, and immediately wash your hands with soap and water for at least 20 seconds or use an alcohol-based hand rub.  Wash your [facility] your hands often and thoroughly with soap and water for at least 20 seconds. You can use an alcohol-based hand sanitizer if soap and water are not available and if your hands are not visibly dirty. Avoid touching your eyes, nose, and mouth with unwashed hands.   Prevention Steps for Caregivers and Household Members of Individuals Confirmed to have, or Being Evaluated for, COVID-19 Infection Being Cared for in the Home  If you live with, or provide care at home for, a person confirmed to have, or being evaluated for, COVID-19 infection please follow these guidelines to prevent infection:  Follow healthcare providers instructions Make sure that you understand and can help the patient follow any healthcare provider instructions for all care.  Provide for the patients basic needs You should help the patient with basic needs in the home and provide support for getting groceries, prescriptions, and other personal needs.  Monitor the patients symptoms If they are getting sicker, call his or her medical provider and tell them that the patient has, or is being evaluated for, COVID-19 infection. This will help the healthcare providers office  take steps to keep other people from getting infected. °Ask the healthcare provider to call the local or state health department. ° °Limit the number of people who have contact with the patient °If possible, have only one caregiver for the patient. °Other  household members should stay in another home or place of residence. If this is not possible, they should stay °in another room, or be separated from the patient as much as possible. Use a separate bathroom, if available. °Restrict visitors who do not have an essential need to be in the home. ° °Keep older adults, very young children, and other sick people away from the patient °Keep older adults, very young children, and those who have compromised immune systems or chronic health conditions away from the patient. This includes people with chronic heart, lung, or kidney conditions, diabetes, and cancer. ° °Ensure good ventilation °Make sure that shared spaces in the home have good air flow, such as from an air conditioner or an opened window, °weather permitting. ° °Wash your hands often °Wash your hands often and thoroughly with soap and water for at least 20 seconds. You can use an alcohol based hand sanitizer if soap and water are not available and if your hands are not visibly dirty. °Avoid touching your eyes, nose, and mouth with unwashed hands. °Use disposable paper towels to dry your hands. If not available, use dedicated cloth towels and replace them when they become wet. ° °Wear a facemask and gloves °Wear a disposable facemask at all times in the room and gloves when you touch or have contact with the patient’s blood, body fluids, and/or secretions or excretions, such as sweat, saliva, sputum, nasal mucus, vomit, urine, or feces.  Ensure the mask fits over your nose and mouth tightly, and do not touch it during use. °Throw out disposable facemasks and gloves after using them. Do not reuse. °Wash your hands immediately after removing your facemask and gloves. °If your personal clothing becomes contaminated, carefully remove clothing and launder. Wash your hands after handling contaminated clothing. °Place all used disposable facemasks, gloves, and other waste in a lined container before disposing them with  other household waste. °Remove gloves and wash your hands immediately after handling these items. ° °Do not share dishes, glasses, or other household items with the patient °Avoid sharing household items. You should not share dishes, drinking glasses, cups, eating utensils, towels, bedding, or other items with a patient who is confirmed to have, or being evaluated for, COVID-19 infection. °After the person uses these items, you should wash them thoroughly with soap and water. ° °Wash laundry thoroughly °Immediately remove and wash clothes or bedding that have blood, body fluids, and/or secretions or excretions, such as sweat, saliva, sputum, nasal mucus, vomit, urine, or feces, on them. °Wear gloves when handling laundry from the patient. °Read and follow directions on labels of laundry or clothing items and detergent. In general, wash and dry with the warmest temperatures recommended on the label. ° °Clean all areas the individual has used often °Clean all touchable surfaces, such as counters, tabletops, doorknobs, bathroom fixtures, toilets, phones, keyboards, tablets, and bedside tables, every day. Also, clean any surfaces that may have blood, body fluids, and/or secretions or excretions on them. °Wear gloves when cleaning surfaces the patient has come in contact with. °Use a diluted bleach solution (e.g., dilute bleach with 1 part bleach and 10 parts water) or a household disinfectant with a label that says EPA-registered for coronaviruses. To make a bleach   solution at home, add 1 tablespoon of bleach to 1 quart (4 cups) of water. For a larger supply, add  cup of bleach to 1 gallon (16 cups) of water. Read labels of cleaning products and follow recommendations provided on product labels. Labels contain instructions for safe and effective use of the cleaning product including precautions you should take when applying the product, such as wearing gloves or eye protection and making sure you have good ventilation  during use of the product. Remove gloves and wash hands immediately after cleaning.  Monitor yourself for signs and symptoms of illness Caregivers and household members are considered close contacts, should monitor their health, and will be asked to limit movement outside of the home to the extent possible. Follow the monitoring steps for close contacts listed on the symptom monitoring form.   ? If you have additional questions, contact your local health department or call the epidemiologist on call at [phone] (available 24/7). ? This guidance is subject to change. For the most up-to-date guidance from [facility], please refer to their website: [email or web address]

## 2020-05-09 NOTE — ED Provider Notes (Signed)
[place] EMERGENCY DEPARTMENT Provider Note   CSN: [id number] Arrival date & time: 05/09/20  1834     History Chief Complaint  Patient presents with  . COVID+    Jonathan Ryan is a 87 y.o. male.  Pt presents to the ED today with wanting antibody treatment for Covid.  Pt said he tested positive for Covid 1 week ago.  He denies any sob or fever.  He has not been vaccinated.        Past Medical History:  Diagnosis Date  . Aortic valve disorder 06/11/2015  . Arthritis   . Blood transfusion without reported diagnosis   . CAD (coronary artery disease)   . Carotid artery disease ([place]) 06/11/2015  . Cataract   . CHF (congestive heart failure) ([place])   . Colostomy in place [facility]) 06/11/2015  . Coronary artery disease   . Coronary artery disease involving native coronary artery of native heart without angina pectoris 01/06/2015  . Descending thoracic aortic aneurysm ([place])   . Diabetes mellitus without complication ([place])   . Diverticular disease of left colon   . Emphysema of lung ([place])    as a child  . GERD (gastroesophageal reflux disease)   . HTN (hypertension) 06/11/2015  . Hyperlipidemia 01/06/2015  . Hypertension   . Near syncope 06/10/2015  . Pre-syncope 06/10/2015  . Pulmonary embolism ([place])   . Right hip pain 12/15/2016   Overview:  Added automatically from request for surgery [id number] Overview:  Added automatically from request for surgery [phone]  . S/P aortic valve replacement with bioprosthetic valve 01/06/2015  . Statin intolerance 01/06/2015  . Stroke ([place])    08/01/2015  . Thoracic aortic aneurysm without rupture ([place]) 01/06/2015  . Thoracic ascending aortic aneurysm ([place])   . Thyroid disease    reports nodules  . Type 2 diabetes mellitus with complication ([place]) [id number]  . Vertebral artery stenosis     Patient Active Problem List   Diagnosis Date Noted  . Chronic diastolic heart failure ([place]) 05/05/2017  . Type 2 diabetes mellitus with complication  ([place]) [id number]  . Aortic valve disorder 06/11/2015  . Carotid artery disease ([place]) 06/11/2015  . Hypertensive heart disease with heart failure ([place]) 06/11/2015  . Colostomy in place [facility]) 06/11/2015  . Near syncope 06/10/2015  . Pre-syncope 06/10/2015  . Mild CAD 01/06/2015  . Hyperlipidemia 01/06/2015  . S/P aortic valve replacement with bioprosthetic valve 01/06/2015  . Statin intolerance 01/06/2015  . Thoracic aortic aneurysm without rupture ([place]) 01/06/2015    Past Surgical History:  Procedure Laterality Date  . AORTIC VALVE REPAIR    . COLON SURGERY    . HERNIA REPAIR    . SMALL INTESTINE SURGERY         Family History  Problem Relation Age of Onset  . Heart attack Father   . Heart disease Father   . Breast cancer Sister   . Bladder Cancer Brother     Social History   Tobacco Use  . Smoking status: Former Smoker    Years: 0.00    Types: Cigarettes  . Smokeless tobacco: Never Used  . Tobacco comment: Smoked 2 packs/ week as a teenager   Vaping Use  . Vaping Use: Never used  Substance Use Topics  . Alcohol use: No    Alcohol/week: 0.0 standard drinks  . Drug use: No    Home Medications Prior to Admission medications   Medication Sig Start Date End Date Taking? Authorizing Provider  albuterol (PROVENTIL) (2.5 MG/3ML) 0.083% nebulizer solution Take 3 mLs (2.5 mg total) by nebulization every 6 (six) hours as needed for wheezing or shortness of breath. Take daily in am with hypertonic saline nebs. 10/08/19   Magdalen Spatz, NP  apixaban [facility] [place]) 5 MG TABS tablet  04/08/19   [provider]  aspirin 325 MG EC tablet Take 325 mg by mouth daily.    [provider]  atorvastatin (LIPITOR) 20 MG tablet Take 1 tablet by mouth. once weekly    [provider]  Cholecalciferol (VITAMIN D3) 1000 units CAPS Take 1 capsule by mouth 2 (two) times daily.     [provider]  co-enzyme Q-10 30 MG capsule Take by mouth.    [provider]  finasteride (PROSCAR) 5 MG tablet Take 5 mg by mouth daily.    [provider]  furosemide (LASIX) 40 MG tablet Take 20 mg by mouth daily.     [provider]  insulin detemir (LEVEMIR) 100 UNIT/ML injection Inject 40 Units into the skin at bedtime.     [provider]  metFORMIN (GLUCOPHAGE) 1000 MG tablet Take 1,000 mg by mouth 2 (two) times daily with a meal.  07/12/16   [provider]  metoprolol succinate (TOPROL-XL) 25 MG 24 hr tablet Take 12.5 mg by mouth daily. Only takes if BP is high per patient 07/19/18   [provider]  midodrine (PROAMATINE) 5 MG tablet Take 2.5 mg by mouth 3 (three) times daily with meals.    [provider]  Multiple Vitamin (MULTI-VITAMINS) TABS Take 1 tablet by mouth daily.     [provider]  Nutritional Supplements (PROSTA VITE PO) Take 1 tablet by mouth daily.    [provider]  Respiratory Therapy Supplies (FLUTTER) DEVI 1 Device by Does not apply route as directed. 07/02/19   Spero Geralds, MD  selenium 50 MCG TABS tablet Take 50 mcg by mouth daily.    [provider]  sodium chloride HYPERTONIC 3 % nebulizer solution Take by nebulization in the morning and at bedtime. Use flutter valve after using this treatment. 07/02/19   Spero Geralds, MD  tamsulosin (FLOMAX) 0.4 MG CAPS capsule Take by mouth.    [provider]  zinc gluconate 50 MG tablet Take 50 mg by mouth daily.    [provider]    Allergies    Penicillins, Ramipril, Vancomycin, and Oxycodone  Review of Systems   Review of Systems  Respiratory: Positive for cough.   All other systems reviewed and are negative.   Physical Exam Updated Vital Signs BP 111/90 (BP Location: Right Arm)   Pulse 78   Temp 99.9 F (37.7 C) (Oral)   Resp 20   Ht 5\' 9"  (1.753 m)   Wt 81.5 kg   SpO2 97%   BMI 26.53 kg/m   Physical Exam Vitals and nursing note reviewed.  Constitutional:       Appearance: Normal appearance.  HENT:     Head: Normocephalic and atraumatic.     Right Ear: External ear normal.     Left Ear: External ear normal.     Nose: Nose normal.     Mouth/Throat:     Mouth: Mucous membranes are moist.     Pharynx: Oropharynx is clear.  Eyes:     Extraocular Movements: Extraocular movements intact.     Conjunctiva/sclera: Conjunctivae normal.     Pupils: Pupils are equal, round, and reactive  to light.  Cardiovascular:     Rate and Rhythm: Normal rate and regular rhythm.     Pulses: Normal pulses.     Heart sounds: Normal heart sounds.  Pulmonary:     Effort: Pulmonary effort is normal.     Breath sounds: Normal breath sounds.  Abdominal:     General: Abdomen is flat. Bowel sounds are normal.     Palpations: Abdomen is soft.  Musculoskeletal:        General: Normal range of motion.     Cervical back: Normal range of motion and neck supple.  Skin:    General: Skin is warm.     Capillary Refill: Capillary refill takes less than 2 seconds.  Neurological:     General: No focal deficit present.     Mental Status: He is alert and oriented to person, place, and time.  Psychiatric:        Mood and Affect: Mood normal.        Behavior: Behavior normal.     ED Results / Procedures / Treatments   Labs (all labs ordered are listed, but only abnormal results are displayed) Labs Reviewed  POC SARS CORONAVIRUS 2 AG -  ED - Abnormal; Notable for the following components:      Result Value   SARS Coronavirus 2 Ag POSITIVE (*)    All other components within normal limits    EKG None  Radiology\  DG Chest Portable 1 View  Result Date: 05/09/2020 CLINICAL DATA:  Shortness of breath EXAM: PORTABLE CHEST 1 VIEW COMPARISON:  10/15/2018 FINDINGS: Left pacer remains in place, unchanged. Prior median sternotomy and valve replacement. Aneurysmal dilatation of the aortic arch, stable. Heart is normal size. Left base atelectasis or early infiltrate. Right lung  clear. IMPRESSION: Minimal left base density could reflect atelectasis or early infiltrate. Aneurysmal aortic arch, stable. Electronically Signed   By: Rolm Baptise M.D.   On: 05/09/2020 19:22    Procedures Procedures   Medications Ordered in ED Medications - No data to display  ED Course  I have reviewed the triage vital signs and the nursing notes.  Pertinent labs & imaging results that were available during my care of the patient were reviewed by me and considered in my medical decision making (see chart for details).    MDM Rules/Calculators/A&P                         Pt does meet criteria for mab tx due to multiple medical problems and age.  However, we don't have this treatment available in the ED any longer.  He is not hypoxic and looks nontoxic, so he can go home.  An ambulatory referral is placed for outpatient mab tx.  Pt is stable for d/c.  Return if worse.  TUPAC JEFFUS was evaluated in Emergency Department on 05/09/2020 for the symptoms described in the history of present illness. He was evaluated in the context of the global COVID-19 pandemic, which necessitated consideration that the patient might be at risk for infection with the SARS-CoV-2 virus that causes COVID-19. Institutional protocols and algorithms that pertain to the evaluation of patients at risk for COVID-19 are in a state of rapid change based on information released by regulatory bodies including the CDC and federal and state organizations. These policies and algorithms were followed during the patient's care in the ED. Final Clinical Impression(s) / ED Diagnoses Final diagnoses:  [id number]    Rx /  DC Orders ED Discharge Orders         Ordered    Ambulatory referral for Covid Treatment        05/09/20 2127           Isla Pence, MD 05/09/20 2131

## 2020-05-11 DIAGNOSIS — U071 COVID-19: Secondary | ICD-10-CM | POA: Diagnosis not present

## 2020-05-15 DIAGNOSIS — H353221 Exudative age-related macular degeneration, left eye, with active choroidal neovascularization: Secondary | ICD-10-CM | POA: Diagnosis not present

## 2020-05-16 DIAGNOSIS — R059 Cough, unspecified: Secondary | ICD-10-CM | POA: Diagnosis not present

## 2020-05-16 DIAGNOSIS — R0602 Shortness of breath: Secondary | ICD-10-CM | POA: Diagnosis not present

## 2020-05-16 DIAGNOSIS — Z20828 Contact with and (suspected) exposure to other viral communicable diseases: Secondary | ICD-10-CM | POA: Diagnosis not present

## 2020-05-16 DIAGNOSIS — U099 Post covid-19 condition, unspecified: Secondary | ICD-10-CM | POA: Diagnosis not present

## 2020-05-16 DIAGNOSIS — J189 Pneumonia, unspecified organism: Secondary | ICD-10-CM | POA: Diagnosis not present

## 2020-05-23 DIAGNOSIS — B0229 Other postherpetic nervous system involvement: Secondary | ICD-10-CM | POA: Diagnosis not present

## 2020-05-23 DIAGNOSIS — U071 COVID-19: Secondary | ICD-10-CM | POA: Diagnosis not present

## 2020-05-23 DIAGNOSIS — R059 Cough, unspecified: Secondary | ICD-10-CM | POA: Diagnosis not present

## 2020-05-23 DIAGNOSIS — J1282 Pneumonia due to coronavirus disease 2019: Secondary | ICD-10-CM | POA: Diagnosis not present

## 2020-06-04 DIAGNOSIS — J42 Unspecified chronic bronchitis: Secondary | ICD-10-CM | POA: Diagnosis not present

## 2020-06-10 DIAGNOSIS — Z974 Presence of external hearing-aid: Secondary | ICD-10-CM | POA: Diagnosis not present

## 2020-06-10 DIAGNOSIS — H9193 Unspecified hearing loss, bilateral: Secondary | ICD-10-CM | POA: Diagnosis not present

## 2020-06-10 DIAGNOSIS — T161XXA Foreign body in right ear, initial encounter: Secondary | ICD-10-CM | POA: Diagnosis not present

## 2020-06-10 DIAGNOSIS — H6091 Unspecified otitis externa, right ear: Secondary | ICD-10-CM | POA: Diagnosis not present

## 2020-06-12 DIAGNOSIS — H353221 Exudative age-related macular degeneration, left eye, with active choroidal neovascularization: Secondary | ICD-10-CM | POA: Diagnosis not present

## 2020-06-13 DIAGNOSIS — I712 Thoracic aortic aneurysm, without rupture: Secondary | ICD-10-CM | POA: Diagnosis not present

## 2020-06-13 DIAGNOSIS — E041 Nontoxic single thyroid nodule: Secondary | ICD-10-CM | POA: Diagnosis not present

## 2020-06-13 DIAGNOSIS — R079 Chest pain, unspecified: Secondary | ICD-10-CM | POA: Diagnosis not present

## 2020-06-13 DIAGNOSIS — I110 Hypertensive heart disease with heart failure: Secondary | ICD-10-CM | POA: Diagnosis not present

## 2020-06-13 DIAGNOSIS — Z7984 Long term (current) use of oral hypoglycemic drugs: Secondary | ICD-10-CM | POA: Diagnosis not present

## 2020-06-13 DIAGNOSIS — Z1152 Encounter for screening for COVID-19: Secondary | ICD-10-CM | POA: Diagnosis not present

## 2020-06-13 DIAGNOSIS — Z7901 Long term (current) use of anticoagulants: Secondary | ICD-10-CM | POA: Diagnosis not present

## 2020-06-13 DIAGNOSIS — I739 Peripheral vascular disease, unspecified: Secondary | ICD-10-CM | POA: Diagnosis not present

## 2020-06-13 DIAGNOSIS — I6789 Other cerebrovascular disease: Secondary | ICD-10-CM | POA: Diagnosis not present

## 2020-06-13 DIAGNOSIS — Z79899 Other long term (current) drug therapy: Secondary | ICD-10-CM | POA: Diagnosis not present

## 2020-06-13 DIAGNOSIS — K8020 Calculus of gallbladder without cholecystitis without obstruction: Secondary | ICD-10-CM | POA: Diagnosis not present

## 2020-06-13 DIAGNOSIS — I10 Essential (primary) hypertension: Secondary | ICD-10-CM | POA: Diagnosis not present

## 2020-06-13 DIAGNOSIS — Z8673 Personal history of transient ischemic attack (TIA), and cerebral infarction without residual deficits: Secondary | ICD-10-CM | POA: Diagnosis not present

## 2020-06-13 DIAGNOSIS — G319 Degenerative disease of nervous system, unspecified: Secondary | ICD-10-CM | POA: Diagnosis not present

## 2020-06-13 DIAGNOSIS — I2510 Atherosclerotic heart disease of native coronary artery without angina pectoris: Secondary | ICD-10-CM | POA: Diagnosis not present

## 2020-06-13 DIAGNOSIS — Z20828 Contact with and (suspected) exposure to other viral communicable diseases: Secondary | ICD-10-CM | POA: Diagnosis not present

## 2020-06-13 DIAGNOSIS — Z87891 Personal history of nicotine dependence: Secondary | ICD-10-CM | POA: Diagnosis not present

## 2020-06-13 DIAGNOSIS — E119 Type 2 diabetes mellitus without complications: Secondary | ICD-10-CM | POA: Diagnosis not present

## 2020-06-13 DIAGNOSIS — E042 Nontoxic multinodular goiter: Secondary | ICD-10-CM | POA: Diagnosis not present

## 2020-06-13 DIAGNOSIS — Z950 Presence of cardiac pacemaker: Secondary | ICD-10-CM | POA: Diagnosis not present

## 2020-06-13 DIAGNOSIS — Z952 Presence of prosthetic heart valve: Secondary | ICD-10-CM | POA: Diagnosis not present

## 2020-06-13 DIAGNOSIS — F0390 Unspecified dementia without behavioral disturbance: Secondary | ICD-10-CM | POA: Diagnosis not present

## 2020-06-13 DIAGNOSIS — R531 Weakness: Secondary | ICD-10-CM | POA: Diagnosis not present

## 2020-06-13 DIAGNOSIS — I509 Heart failure, unspecified: Secondary | ICD-10-CM | POA: Diagnosis not present

## 2020-06-17 DIAGNOSIS — S00411D Abrasion of right ear, subsequent encounter: Secondary | ICD-10-CM | POA: Diagnosis not present

## 2020-06-17 DIAGNOSIS — Z974 Presence of external hearing-aid: Secondary | ICD-10-CM | POA: Diagnosis not present

## 2020-06-17 DIAGNOSIS — H9193 Unspecified hearing loss, bilateral: Secondary | ICD-10-CM | POA: Diagnosis not present

## 2020-07-02 DIAGNOSIS — Z950 Presence of cardiac pacemaker: Secondary | ICD-10-CM | POA: Diagnosis not present

## 2020-07-02 DIAGNOSIS — J42 Unspecified chronic bronchitis: Secondary | ICD-10-CM | POA: Diagnosis not present

## 2020-07-04 DIAGNOSIS — H524 Presbyopia: Secondary | ICD-10-CM | POA: Diagnosis not present

## 2020-07-22 DIAGNOSIS — I712 Thoracic aortic aneurysm, without rupture: Secondary | ICD-10-CM | POA: Diagnosis not present

## 2020-07-22 DIAGNOSIS — K8080 Other cholelithiasis without obstruction: Secondary | ICD-10-CM | POA: Diagnosis not present

## 2020-07-22 DIAGNOSIS — I2510 Atherosclerotic heart disease of native coronary artery without angina pectoris: Secondary | ICD-10-CM | POA: Diagnosis not present

## 2020-07-22 DIAGNOSIS — I700 Atherosclerosis of aorta: Secondary | ICD-10-CM | POA: Diagnosis not present

## 2020-07-23 DIAGNOSIS — H353221 Exudative age-related macular degeneration, left eye, with active choroidal neovascularization: Secondary | ICD-10-CM | POA: Diagnosis not present

## 2020-07-24 DIAGNOSIS — I712 Thoracic aortic aneurysm, without rupture: Secondary | ICD-10-CM | POA: Diagnosis not present

## 2020-08-01 DIAGNOSIS — Z20828 Contact with and (suspected) exposure to other viral communicable diseases: Secondary | ICD-10-CM | POA: Diagnosis not present

## 2020-08-01 DIAGNOSIS — R051 Acute cough: Secondary | ICD-10-CM | POA: Diagnosis not present

## 2020-08-01 DIAGNOSIS — E1165 Type 2 diabetes mellitus with hyperglycemia: Secondary | ICD-10-CM | POA: Diagnosis not present

## 2020-08-01 DIAGNOSIS — D649 Anemia, unspecified: Secondary | ICD-10-CM | POA: Diagnosis not present

## 2020-08-01 DIAGNOSIS — R509 Fever, unspecified: Secondary | ICD-10-CM | POA: Diagnosis not present

## 2020-08-01 DIAGNOSIS — J069 Acute upper respiratory infection, unspecified: Secondary | ICD-10-CM | POA: Diagnosis not present

## 2020-08-01 DIAGNOSIS — K591 Functional diarrhea: Secondary | ICD-10-CM | POA: Diagnosis not present

## 2020-08-02 DIAGNOSIS — J42 Unspecified chronic bronchitis: Secondary | ICD-10-CM | POA: Diagnosis not present

## 2020-08-21 DIAGNOSIS — E785 Hyperlipidemia, unspecified: Secondary | ICD-10-CM | POA: Diagnosis not present

## 2020-08-21 DIAGNOSIS — Z139 Encounter for screening, unspecified: Secondary | ICD-10-CM | POA: Diagnosis not present

## 2020-08-21 DIAGNOSIS — Z9181 History of falling: Secondary | ICD-10-CM | POA: Diagnosis not present

## 2020-08-21 DIAGNOSIS — Z0000 Encounter for general adult medical examination without abnormal findings: Secondary | ICD-10-CM | POA: Diagnosis not present

## 2020-08-21 DIAGNOSIS — Z1331 Encounter for screening for depression: Secondary | ICD-10-CM | POA: Diagnosis not present

## 2020-08-25 DIAGNOSIS — Z7689 Persons encountering health services in other specified circumstances: Secondary | ICD-10-CM | POA: Diagnosis not present

## 2020-08-25 DIAGNOSIS — I10 Essential (primary) hypertension: Secondary | ICD-10-CM | POA: Diagnosis not present

## 2020-08-25 DIAGNOSIS — I6990 Unspecified sequelae of unspecified cerebrovascular disease: Secondary | ICD-10-CM | POA: Diagnosis not present

## 2020-08-25 DIAGNOSIS — D638 Anemia in other chronic diseases classified elsewhere: Secondary | ICD-10-CM | POA: Diagnosis not present

## 2020-08-25 DIAGNOSIS — E785 Hyperlipidemia, unspecified: Secondary | ICD-10-CM | POA: Diagnosis not present

## 2020-09-01 DIAGNOSIS — J42 Unspecified chronic bronchitis: Secondary | ICD-10-CM | POA: Diagnosis not present

## 2020-09-16 DIAGNOSIS — I719 Aortic aneurysm of unspecified site, without rupture: Secondary | ICD-10-CM | POA: Diagnosis not present

## 2020-09-16 DIAGNOSIS — I480 Paroxysmal atrial fibrillation: Secondary | ICD-10-CM | POA: Diagnosis not present

## 2020-09-16 DIAGNOSIS — I509 Heart failure, unspecified: Secondary | ICD-10-CM | POA: Diagnosis not present

## 2020-09-16 DIAGNOSIS — D692 Other nonthrombocytopenic purpura: Secondary | ICD-10-CM | POA: Diagnosis not present

## 2020-09-16 DIAGNOSIS — Z794 Long term (current) use of insulin: Secondary | ICD-10-CM | POA: Diagnosis not present

## 2020-09-16 DIAGNOSIS — H353290 Exudative age-related macular degeneration, unspecified eye, stage unspecified: Secondary | ICD-10-CM | POA: Diagnosis not present

## 2020-09-16 DIAGNOSIS — E1151 Type 2 diabetes mellitus with diabetic peripheral angiopathy without gangrene: Secondary | ICD-10-CM | POA: Diagnosis not present

## 2020-09-16 DIAGNOSIS — Z7901 Long term (current) use of anticoagulants: Secondary | ICD-10-CM | POA: Diagnosis not present

## 2020-09-16 DIAGNOSIS — D6869 Other thrombophilia: Secondary | ICD-10-CM | POA: Diagnosis not present

## 2020-09-16 DIAGNOSIS — I70203 Unspecified atherosclerosis of native arteries of extremities, bilateral legs: Secondary | ICD-10-CM | POA: Diagnosis not present

## 2020-09-16 DIAGNOSIS — C61 Malignant neoplasm of prostate: Secondary | ICD-10-CM | POA: Diagnosis not present

## 2020-09-16 DIAGNOSIS — E261 Secondary hyperaldosteronism: Secondary | ICD-10-CM | POA: Diagnosis not present

## 2020-09-18 DIAGNOSIS — H353221 Exudative age-related macular degeneration, left eye, with active choroidal neovascularization: Secondary | ICD-10-CM | POA: Diagnosis not present

## 2020-10-02 DIAGNOSIS — J42 Unspecified chronic bronchitis: Secondary | ICD-10-CM | POA: Diagnosis not present

## 2020-10-10 DIAGNOSIS — I10 Essential (primary) hypertension: Secondary | ICD-10-CM | POA: Diagnosis not present

## 2020-10-10 DIAGNOSIS — J208 Acute bronchitis due to other specified organisms: Secondary | ICD-10-CM | POA: Diagnosis not present

## 2020-10-10 DIAGNOSIS — I5032 Chronic diastolic (congestive) heart failure: Secondary | ICD-10-CM | POA: Diagnosis not present

## 2020-10-10 DIAGNOSIS — E1129 Type 2 diabetes mellitus with other diabetic kidney complication: Secondary | ICD-10-CM | POA: Diagnosis not present

## 2020-10-10 DIAGNOSIS — B9689 Other specified bacterial agents as the cause of diseases classified elsewhere: Secondary | ICD-10-CM | POA: Diagnosis not present

## 2020-10-14 DIAGNOSIS — Z933 Colostomy status: Secondary | ICD-10-CM | POA: Diagnosis not present

## 2020-10-14 DIAGNOSIS — J069 Acute upper respiratory infection, unspecified: Secondary | ICD-10-CM | POA: Diagnosis not present

## 2020-10-14 DIAGNOSIS — Z20828 Contact with and (suspected) exposure to other viral communicable diseases: Secondary | ICD-10-CM | POA: Diagnosis not present

## 2020-10-17 DIAGNOSIS — J208 Acute bronchitis due to other specified organisms: Secondary | ICD-10-CM | POA: Diagnosis not present

## 2020-10-17 DIAGNOSIS — B9689 Other specified bacterial agents as the cause of diseases classified elsewhere: Secondary | ICD-10-CM | POA: Diagnosis not present

## 2020-11-01 DIAGNOSIS — J42 Unspecified chronic bronchitis: Secondary | ICD-10-CM | POA: Diagnosis not present

## 2020-11-10 DIAGNOSIS — L299 Pruritus, unspecified: Secondary | ICD-10-CM | POA: Diagnosis not present

## 2020-11-10 DIAGNOSIS — E1129 Type 2 diabetes mellitus with other diabetic kidney complication: Secondary | ICD-10-CM | POA: Diagnosis not present

## 2020-11-10 DIAGNOSIS — I5032 Chronic diastolic (congestive) heart failure: Secondary | ICD-10-CM | POA: Diagnosis not present

## 2020-11-10 DIAGNOSIS — I10 Essential (primary) hypertension: Secondary | ICD-10-CM | POA: Diagnosis not present

## 2020-11-12 DIAGNOSIS — L299 Pruritus, unspecified: Secondary | ICD-10-CM | POA: Diagnosis not present

## 2020-11-19 DIAGNOSIS — J069 Acute upper respiratory infection, unspecified: Secondary | ICD-10-CM | POA: Diagnosis not present

## 2020-11-19 DIAGNOSIS — Z20828 Contact with and (suspected) exposure to other viral communicable diseases: Secondary | ICD-10-CM | POA: Diagnosis not present

## 2020-11-21 DIAGNOSIS — Z950 Presence of cardiac pacemaker: Secondary | ICD-10-CM | POA: Diagnosis not present

## 2020-11-21 DIAGNOSIS — C61 Malignant neoplasm of prostate: Secondary | ICD-10-CM | POA: Diagnosis not present

## 2020-11-21 DIAGNOSIS — I480 Paroxysmal atrial fibrillation: Secondary | ICD-10-CM | POA: Diagnosis not present

## 2020-11-21 DIAGNOSIS — I509 Heart failure, unspecified: Secondary | ICD-10-CM | POA: Diagnosis not present

## 2020-11-21 DIAGNOSIS — Z7901 Long term (current) use of anticoagulants: Secondary | ICD-10-CM | POA: Diagnosis not present

## 2020-11-21 DIAGNOSIS — E261 Secondary hyperaldosteronism: Secondary | ICD-10-CM | POA: Diagnosis not present

## 2020-11-21 DIAGNOSIS — D6869 Other thrombophilia: Secondary | ICD-10-CM | POA: Diagnosis not present

## 2020-12-01 DIAGNOSIS — E785 Hyperlipidemia, unspecified: Secondary | ICD-10-CM | POA: Diagnosis not present

## 2020-12-01 DIAGNOSIS — I10 Essential (primary) hypertension: Secondary | ICD-10-CM | POA: Diagnosis not present

## 2020-12-01 DIAGNOSIS — E1129 Type 2 diabetes mellitus with other diabetic kidney complication: Secondary | ICD-10-CM | POA: Diagnosis not present

## 2020-12-01 DIAGNOSIS — D638 Anemia in other chronic diseases classified elsewhere: Secondary | ICD-10-CM | POA: Diagnosis not present

## 2020-12-01 DIAGNOSIS — G8191 Hemiplegia, unspecified affecting right dominant side: Secondary | ICD-10-CM | POA: Diagnosis not present

## 2020-12-02 DIAGNOSIS — J42 Unspecified chronic bronchitis: Secondary | ICD-10-CM | POA: Diagnosis not present

## 2020-12-08 DIAGNOSIS — H2511 Age-related nuclear cataract, right eye: Secondary | ICD-10-CM | POA: Diagnosis not present

## 2020-12-08 DIAGNOSIS — H353111 Nonexudative age-related macular degeneration, right eye, early dry stage: Secondary | ICD-10-CM | POA: Diagnosis not present

## 2020-12-08 DIAGNOSIS — H5203 Hypermetropia, bilateral: Secondary | ICD-10-CM | POA: Diagnosis not present

## 2020-12-08 DIAGNOSIS — H25011 Cortical age-related cataract, right eye: Secondary | ICD-10-CM | POA: Diagnosis not present

## 2020-12-08 DIAGNOSIS — H43813 Vitreous degeneration, bilateral: Secondary | ICD-10-CM | POA: Diagnosis not present

## 2020-12-08 DIAGNOSIS — H353122 Nonexudative age-related macular degeneration, left eye, intermediate dry stage: Secondary | ICD-10-CM | POA: Diagnosis not present

## 2020-12-08 DIAGNOSIS — H02403 Unspecified ptosis of bilateral eyelids: Secondary | ICD-10-CM | POA: Diagnosis not present

## 2020-12-08 DIAGNOSIS — H3589 Other specified retinal disorders: Secondary | ICD-10-CM | POA: Diagnosis not present

## 2020-12-08 DIAGNOSIS — H52203 Unspecified astigmatism, bilateral: Secondary | ICD-10-CM | POA: Diagnosis not present

## 2020-12-08 DIAGNOSIS — H25041 Posterior subcapsular polar age-related cataract, right eye: Secondary | ICD-10-CM | POA: Diagnosis not present

## 2020-12-08 DIAGNOSIS — H43393 Other vitreous opacities, bilateral: Secondary | ICD-10-CM | POA: Diagnosis not present

## 2020-12-08 DIAGNOSIS — H524 Presbyopia: Secondary | ICD-10-CM | POA: Diagnosis not present

## 2020-12-08 DIAGNOSIS — Z961 Presence of intraocular lens: Secondary | ICD-10-CM | POA: Diagnosis not present

## 2020-12-30 DIAGNOSIS — E1129 Type 2 diabetes mellitus with other diabetic kidney complication: Secondary | ICD-10-CM | POA: Diagnosis not present

## 2020-12-30 DIAGNOSIS — E785 Hyperlipidemia, unspecified: Secondary | ICD-10-CM | POA: Diagnosis not present

## 2020-12-30 DIAGNOSIS — I10 Essential (primary) hypertension: Secondary | ICD-10-CM | POA: Diagnosis not present

## 2020-12-30 DIAGNOSIS — I5032 Chronic diastolic (congestive) heart failure: Secondary | ICD-10-CM | POA: Diagnosis not present

## 2020-12-31 DIAGNOSIS — Z45018 Encounter for adjustment and management of other part of cardiac pacemaker: Secondary | ICD-10-CM | POA: Diagnosis not present

## 2021-01-02 DIAGNOSIS — J42 Unspecified chronic bronchitis: Secondary | ICD-10-CM | POA: Diagnosis not present

## 2021-01-05 DIAGNOSIS — Z86711 Personal history of pulmonary embolism: Secondary | ICD-10-CM | POA: Diagnosis not present

## 2021-01-05 DIAGNOSIS — G8191 Hemiplegia, unspecified affecting right dominant side: Secondary | ICD-10-CM | POA: Diagnosis not present

## 2021-01-05 DIAGNOSIS — Z7901 Long term (current) use of anticoagulants: Secondary | ICD-10-CM | POA: Diagnosis not present

## 2021-01-19 DIAGNOSIS — R791 Abnormal coagulation profile: Secondary | ICD-10-CM | POA: Diagnosis not present

## 2021-01-26 DIAGNOSIS — Z7901 Long term (current) use of anticoagulants: Secondary | ICD-10-CM | POA: Diagnosis not present

## 2021-01-29 DIAGNOSIS — I480 Paroxysmal atrial fibrillation: Secondary | ICD-10-CM | POA: Diagnosis not present

## 2021-01-29 DIAGNOSIS — E1151 Type 2 diabetes mellitus with diabetic peripheral angiopathy without gangrene: Secondary | ICD-10-CM | POA: Diagnosis not present

## 2021-01-29 DIAGNOSIS — Z6825 Body mass index (BMI) 25.0-25.9, adult: Secondary | ICD-10-CM | POA: Diagnosis not present

## 2021-01-29 DIAGNOSIS — I509 Heart failure, unspecified: Secondary | ICD-10-CM | POA: Diagnosis not present

## 2021-01-29 DIAGNOSIS — Z950 Presence of cardiac pacemaker: Secondary | ICD-10-CM | POA: Diagnosis not present

## 2021-01-29 DIAGNOSIS — D6869 Other thrombophilia: Secondary | ICD-10-CM | POA: Diagnosis not present

## 2021-01-29 DIAGNOSIS — E261 Secondary hyperaldosteronism: Secondary | ICD-10-CM | POA: Diagnosis not present

## 2021-01-29 DIAGNOSIS — Z7901 Long term (current) use of anticoagulants: Secondary | ICD-10-CM | POA: Diagnosis not present

## 2021-01-29 DIAGNOSIS — Z794 Long term (current) use of insulin: Secondary | ICD-10-CM | POA: Diagnosis not present

## 2021-02-01 DIAGNOSIS — J42 Unspecified chronic bronchitis: Secondary | ICD-10-CM | POA: Diagnosis not present

## 2021-02-02 DIAGNOSIS — R791 Abnormal coagulation profile: Secondary | ICD-10-CM | POA: Diagnosis not present

## 2021-02-09 DIAGNOSIS — I5032 Chronic diastolic (congestive) heart failure: Secondary | ICD-10-CM | POA: Diagnosis not present

## 2021-02-09 DIAGNOSIS — E1129 Type 2 diabetes mellitus with other diabetic kidney complication: Secondary | ICD-10-CM | POA: Diagnosis not present

## 2021-02-09 DIAGNOSIS — E785 Hyperlipidemia, unspecified: Secondary | ICD-10-CM | POA: Diagnosis not present

## 2021-02-09 DIAGNOSIS — I10 Essential (primary) hypertension: Secondary | ICD-10-CM | POA: Diagnosis not present

## 2021-02-21 DIAGNOSIS — R059 Cough, unspecified: Secondary | ICD-10-CM | POA: Diagnosis not present

## 2021-02-21 DIAGNOSIS — Z20828 Contact with and (suspected) exposure to other viral communicable diseases: Secondary | ICD-10-CM | POA: Diagnosis not present

## 2021-02-21 DIAGNOSIS — R509 Fever, unspecified: Secondary | ICD-10-CM | POA: Diagnosis not present

## 2021-02-21 DIAGNOSIS — J209 Acute bronchitis, unspecified: Secondary | ICD-10-CM | POA: Diagnosis not present

## 2021-02-24 DIAGNOSIS — I5031 Acute diastolic (congestive) heart failure: Secondary | ICD-10-CM | POA: Diagnosis not present

## 2021-02-25 ENCOUNTER — Telehealth: Payer: Self-pay | Admitting: Cardiology

## 2021-02-25 NOTE — Telephone Encounter (Signed)
Tried to call patient back no answer and no voicemail.

## 2021-02-25 NOTE — Telephone Encounter (Signed)
Patient saw his PCP yesterday, and he heard fluid in his lungs.  He thinks it coming from his heart.  He has swelling in his legs and feet.  He would like to be seen by Dr. Bettina Gavia.

## 2021-02-26 NOTE — Telephone Encounter (Signed)
Spoke to the patient just now and offered to get him scheduled to see Dr. Bettina Gavia since it has been since 11/2018 since we have seen him here in the office. He tells me that he will have to call us back to schedule an appointment.

## 2021-02-27 DIAGNOSIS — I5033 Acute on chronic diastolic (congestive) heart failure: Secondary | ICD-10-CM | POA: Diagnosis not present

## 2021-02-27 DIAGNOSIS — R0602 Shortness of breath: Secondary | ICD-10-CM | POA: Diagnosis not present

## 2021-02-27 DIAGNOSIS — R531 Weakness: Secondary | ICD-10-CM | POA: Diagnosis not present

## 2021-03-01 DIAGNOSIS — E1165 Type 2 diabetes mellitus with hyperglycemia: Secondary | ICD-10-CM | POA: Diagnosis not present

## 2021-03-01 DIAGNOSIS — E860 Dehydration: Secondary | ICD-10-CM | POA: Diagnosis not present

## 2021-03-01 DIAGNOSIS — R0602 Shortness of breath: Secondary | ICD-10-CM | POA: Diagnosis not present

## 2021-03-01 DIAGNOSIS — I482 Chronic atrial fibrillation, unspecified: Secondary | ICD-10-CM | POA: Diagnosis not present

## 2021-03-01 DIAGNOSIS — Z794 Long term (current) use of insulin: Secondary | ICD-10-CM | POA: Diagnosis not present

## 2021-03-01 DIAGNOSIS — Z9889 Other specified postprocedural states: Secondary | ICD-10-CM | POA: Diagnosis not present

## 2021-03-01 DIAGNOSIS — N179 Acute kidney failure, unspecified: Secondary | ICD-10-CM | POA: Diagnosis not present

## 2021-03-01 DIAGNOSIS — I272 Pulmonary hypertension, unspecified: Secondary | ICD-10-CM | POA: Diagnosis not present

## 2021-03-01 DIAGNOSIS — Z8673 Personal history of transient ischemic attack (TIA), and cerebral infarction without residual deficits: Secondary | ICD-10-CM | POA: Diagnosis not present

## 2021-03-01 DIAGNOSIS — I5030 Unspecified diastolic (congestive) heart failure: Secondary | ICD-10-CM | POA: Diagnosis not present

## 2021-03-01 DIAGNOSIS — E871 Hypo-osmolality and hyponatremia: Secondary | ICD-10-CM | POA: Diagnosis not present

## 2021-03-01 DIAGNOSIS — I495 Sick sinus syndrome: Secondary | ICD-10-CM | POA: Diagnosis not present

## 2021-03-01 DIAGNOSIS — Z20822 Contact with and (suspected) exposure to covid-19: Secondary | ICD-10-CM | POA: Diagnosis not present

## 2021-03-01 DIAGNOSIS — R2689 Other abnormalities of gait and mobility: Secondary | ICD-10-CM | POA: Diagnosis not present

## 2021-03-01 DIAGNOSIS — I5032 Chronic diastolic (congestive) heart failure: Secondary | ICD-10-CM | POA: Diagnosis not present

## 2021-03-01 DIAGNOSIS — Z950 Presence of cardiac pacemaker: Secondary | ICD-10-CM | POA: Diagnosis not present

## 2021-03-01 DIAGNOSIS — I110 Hypertensive heart disease with heart failure: Secondary | ICD-10-CM | POA: Diagnosis not present

## 2021-03-01 DIAGNOSIS — I10 Essential (primary) hypertension: Secondary | ICD-10-CM | POA: Diagnosis not present

## 2021-03-01 DIAGNOSIS — I4891 Unspecified atrial fibrillation: Secondary | ICD-10-CM | POA: Diagnosis not present

## 2021-03-01 DIAGNOSIS — J441 Chronic obstructive pulmonary disease with (acute) exacerbation: Secondary | ICD-10-CM | POA: Diagnosis not present

## 2021-03-01 DIAGNOSIS — Z7409 Other reduced mobility: Secondary | ICD-10-CM | POA: Diagnosis not present

## 2021-03-01 DIAGNOSIS — E876 Hypokalemia: Secondary | ICD-10-CM | POA: Diagnosis not present

## 2021-03-02 DIAGNOSIS — E876 Hypokalemia: Secondary | ICD-10-CM | POA: Diagnosis not present

## 2021-03-02 DIAGNOSIS — E871 Hypo-osmolality and hyponatremia: Secondary | ICD-10-CM | POA: Diagnosis not present

## 2021-03-02 DIAGNOSIS — R0602 Shortness of breath: Secondary | ICD-10-CM | POA: Diagnosis not present

## 2021-03-02 DIAGNOSIS — N179 Acute kidney failure, unspecified: Secondary | ICD-10-CM | POA: Diagnosis not present

## 2021-03-02 DIAGNOSIS — I110 Hypertensive heart disease with heart failure: Secondary | ICD-10-CM | POA: Diagnosis not present

## 2021-03-02 DIAGNOSIS — I5032 Chronic diastolic (congestive) heart failure: Secondary | ICD-10-CM | POA: Diagnosis not present

## 2021-03-02 DIAGNOSIS — Z7982 Long term (current) use of aspirin: Secondary | ICD-10-CM | POA: Diagnosis not present

## 2021-03-02 DIAGNOSIS — I5030 Unspecified diastolic (congestive) heart failure: Secondary | ICD-10-CM | POA: Diagnosis not present

## 2021-03-02 DIAGNOSIS — E860 Dehydration: Secondary | ICD-10-CM | POA: Diagnosis not present

## 2021-03-02 DIAGNOSIS — E1165 Type 2 diabetes mellitus with hyperglycemia: Secondary | ICD-10-CM | POA: Diagnosis not present

## 2021-03-02 DIAGNOSIS — I4891 Unspecified atrial fibrillation: Secondary | ICD-10-CM | POA: Diagnosis not present

## 2021-03-02 DIAGNOSIS — Z954 Presence of other heart-valve replacement: Secondary | ICD-10-CM | POA: Diagnosis not present

## 2021-03-02 DIAGNOSIS — Z950 Presence of cardiac pacemaker: Secondary | ICD-10-CM | POA: Diagnosis not present

## 2021-03-03 DIAGNOSIS — R0602 Shortness of breath: Secondary | ICD-10-CM | POA: Diagnosis not present

## 2021-03-03 DIAGNOSIS — I5030 Unspecified diastolic (congestive) heart failure: Secondary | ICD-10-CM | POA: Diagnosis not present

## 2021-03-03 DIAGNOSIS — Z950 Presence of cardiac pacemaker: Secondary | ICD-10-CM | POA: Diagnosis not present

## 2021-03-04 DIAGNOSIS — Z950 Presence of cardiac pacemaker: Secondary | ICD-10-CM | POA: Diagnosis not present

## 2021-03-04 DIAGNOSIS — R531 Weakness: Secondary | ICD-10-CM | POA: Diagnosis not present

## 2021-03-04 DIAGNOSIS — R051 Acute cough: Secondary | ICD-10-CM | POA: Diagnosis not present

## 2021-03-04 DIAGNOSIS — E119 Type 2 diabetes mellitus without complications: Secondary | ICD-10-CM | POA: Diagnosis not present

## 2021-03-04 DIAGNOSIS — R079 Chest pain, unspecified: Secondary | ICD-10-CM | POA: Diagnosis not present

## 2021-03-04 DIAGNOSIS — J189 Pneumonia, unspecified organism: Secondary | ICD-10-CM | POA: Diagnosis not present

## 2021-03-04 DIAGNOSIS — R0602 Shortness of breath: Secondary | ICD-10-CM | POA: Diagnosis not present

## 2021-03-04 DIAGNOSIS — Z955 Presence of coronary angioplasty implant and graft: Secondary | ICD-10-CM | POA: Diagnosis not present

## 2021-03-04 DIAGNOSIS — R5381 Other malaise: Secondary | ICD-10-CM | POA: Diagnosis not present

## 2021-03-04 DIAGNOSIS — I509 Heart failure, unspecified: Secondary | ICD-10-CM | POA: Diagnosis not present

## 2021-03-04 DIAGNOSIS — J42 Unspecified chronic bronchitis: Secondary | ICD-10-CM | POA: Diagnosis not present

## 2021-03-04 DIAGNOSIS — I712 Thoracic aortic aneurysm, without rupture, unspecified: Secondary | ICD-10-CM | POA: Diagnosis not present

## 2021-03-04 DIAGNOSIS — R0600 Dyspnea, unspecified: Secondary | ICD-10-CM | POA: Diagnosis not present

## 2021-03-04 DIAGNOSIS — J9811 Atelectasis: Secondary | ICD-10-CM | POA: Diagnosis not present

## 2021-03-11 DIAGNOSIS — E785 Hyperlipidemia, unspecified: Secondary | ICD-10-CM | POA: Diagnosis not present

## 2021-03-11 DIAGNOSIS — I5032 Chronic diastolic (congestive) heart failure: Secondary | ICD-10-CM | POA: Diagnosis not present

## 2021-03-11 DIAGNOSIS — E1129 Type 2 diabetes mellitus with other diabetic kidney complication: Secondary | ICD-10-CM | POA: Diagnosis not present

## 2021-03-11 DIAGNOSIS — I712 Thoracic aortic aneurysm, without rupture, unspecified: Secondary | ICD-10-CM | POA: Diagnosis not present

## 2021-03-11 DIAGNOSIS — R0600 Dyspnea, unspecified: Secondary | ICD-10-CM | POA: Diagnosis not present

## 2021-03-11 DIAGNOSIS — I10 Essential (primary) hypertension: Secondary | ICD-10-CM | POA: Diagnosis not present

## 2021-03-26 DIAGNOSIS — J208 Acute bronchitis due to other specified organisms: Secondary | ICD-10-CM | POA: Diagnosis not present

## 2021-03-26 DIAGNOSIS — B9689 Other specified bacterial agents as the cause of diseases classified elsewhere: Secondary | ICD-10-CM | POA: Diagnosis not present

## 2021-04-10 DIAGNOSIS — I10 Essential (primary) hypertension: Secondary | ICD-10-CM | POA: Diagnosis not present

## 2021-04-10 DIAGNOSIS — E785 Hyperlipidemia, unspecified: Secondary | ICD-10-CM | POA: Diagnosis not present

## 2021-04-10 DIAGNOSIS — I5032 Chronic diastolic (congestive) heart failure: Secondary | ICD-10-CM | POA: Diagnosis not present

## 2021-04-14 DIAGNOSIS — R0981 Nasal congestion: Secondary | ICD-10-CM | POA: Diagnosis not present

## 2021-04-14 DIAGNOSIS — R059 Cough, unspecified: Secondary | ICD-10-CM | POA: Diagnosis not present

## 2021-04-14 DIAGNOSIS — Z20828 Contact with and (suspected) exposure to other viral communicable diseases: Secondary | ICD-10-CM | POA: Diagnosis not present

## 2021-04-14 DIAGNOSIS — J069 Acute upper respiratory infection, unspecified: Secondary | ICD-10-CM | POA: Diagnosis not present

## 2021-04-14 DIAGNOSIS — R2243 Localized swelling, mass and lump, lower limb, bilateral: Secondary | ICD-10-CM | POA: Diagnosis not present

## 2021-04-14 DIAGNOSIS — R21 Rash and other nonspecific skin eruption: Secondary | ICD-10-CM | POA: Diagnosis not present

## 2021-04-15 DIAGNOSIS — B353 Tinea pedis: Secondary | ICD-10-CM | POA: Diagnosis not present

## 2021-04-21 DIAGNOSIS — E079 Disorder of thyroid, unspecified: Secondary | ICD-10-CM | POA: Insufficient documentation

## 2021-04-21 DIAGNOSIS — E119 Type 2 diabetes mellitus without complications: Secondary | ICD-10-CM | POA: Insufficient documentation

## 2021-04-21 DIAGNOSIS — K219 Gastro-esophageal reflux disease without esophagitis: Secondary | ICD-10-CM | POA: Insufficient documentation

## 2021-04-21 DIAGNOSIS — I6509 Occlusion and stenosis of unspecified vertebral artery: Secondary | ICD-10-CM | POA: Insufficient documentation

## 2021-04-21 DIAGNOSIS — I2699 Other pulmonary embolism without acute cor pulmonale: Secondary | ICD-10-CM | POA: Insufficient documentation

## 2021-04-21 DIAGNOSIS — J439 Emphysema, unspecified: Secondary | ICD-10-CM | POA: Insufficient documentation

## 2021-04-21 DIAGNOSIS — I509 Heart failure, unspecified: Secondary | ICD-10-CM | POA: Insufficient documentation

## 2021-04-21 DIAGNOSIS — I7121 Aneurysm of the ascending aorta, without rupture: Secondary | ICD-10-CM | POA: Insufficient documentation

## 2021-04-21 DIAGNOSIS — Z5189 Encounter for other specified aftercare: Secondary | ICD-10-CM | POA: Insufficient documentation

## 2021-04-21 DIAGNOSIS — H269 Unspecified cataract: Secondary | ICD-10-CM | POA: Insufficient documentation

## 2021-04-21 DIAGNOSIS — K5730 Diverticulosis of large intestine without perforation or abscess without bleeding: Secondary | ICD-10-CM | POA: Insufficient documentation

## 2021-04-21 DIAGNOSIS — M1990 Unspecified osteoarthritis, unspecified site: Secondary | ICD-10-CM | POA: Insufficient documentation

## 2021-04-23 ENCOUNTER — Ambulatory Visit: Payer: PPO | Admitting: Cardiology

## 2021-04-23 NOTE — Progress Notes (Deleted)
Cardiology Office Note:    Date:  04/23/2021   ID:  Jonathan Ryan, DOB [birth year], MRN [id number]  PCP:  Nicoletta Dress, MD  Cardiologist:  Shirlee More, MD    Referring MD: Nicoletta Dress, MD    ASSESSMENT:    No diagnosis found. PLAN:    In order of problems listed above:  ***   Next appointment: ***   Medication Adjustments/Labs and Tests Ordered: Current medicines are reviewed at length with the patient today.  Concerns regarding medicines are outlined above.  No orders of the defined types were placed in this encounter.  No orders of the defined types were placed in this encounter.   No chief complaint on file.   History of Present Illness:    Jonathan Ryan is a 88 y.o. male with a hx of nonobstructive CAD and aortic valve replacement in 2013 diabetes heart failure hyperlipidemia thoracic aneurysmal disease followed by specialist at [facility] most predominant clinical problem has been orthostatic hypotension last seen by me 11/29/2018.  He had EP ablation of AV nodal reentrant tachycardia [facility] and subsequent pacemaker for bradycardia.  His EP and pacemaker care is through [facility]. Compliance with diet, lifestyle and medications: ***  An echocardiogram performed 03/02/2021 [facility] Left ventricular ejection fraction 35 to 40% with mild LVH there is localized apical septal and inferior akinesia. Discharge summary relates that admitted to the hospital his heart failure was not felt to be an acute exacerbation he was hydrated with IV fluids felt to have been over diuresed as an outpatient creatinine improved from 2.3-1.4 he was hypokalemic with potassium at 3.2 was continued on antibiotics Zithromax and discharged from the hospital.  The admission note relates a history of 1 week of increasing shortness of breath and edema prior to admission to the hospital presentation as she follows with cardiothoracic  surgery and most recent diameter ascending aorta 5.2 cm transverse aorta 6.4 cm.  Does not appear he was seen by the cardiologist Past Medical History:  Diagnosis Date   Aortic valve disorder 06/11/2015   Arthritis    Blood transfusion without reported diagnosis    CAD (coronary artery disease)    Carotid artery disease ([place]) 06/11/2015   Cataract    CHF (congestive heart failure) ([place])    Colostomy in place [facility]) 06/11/2015   Coronary artery disease    Coronary artery disease involving native coronary artery of native heart without angina pectoris 01/06/2015   Descending thoracic aortic aneurysm (HCC)    Diabetes mellitus without complication ([place])    Diverticular disease of left colon    Emphysema of lung ([place])    as a child   GERD (gastroesophageal reflux disease)    HTN (hypertension) 06/11/2015   Hyperlipidemia 01/06/2015   Hypertension    Near syncope 06/10/2015   Pre-syncope 06/10/2015   Pulmonary embolism ([place])    Right hip pain 12/15/2016   Overview:  Added automatically from request for surgery [id number] Overview:  Added automatically from request for surgery [id number]   S/P aortic valve replacement with bioprosthetic valve 01/06/2015   Statin intolerance 01/06/2015   Stroke ([place])    08/01/2015   Thoracic aortic aneurysm without rupture ([place]) 01/06/2015   Thoracic ascending aortic aneurysm [facility])    Thyroid disease    reports nodules   Type 2 diabetes mellitus with complication ([place]) 12/13/9028   Vertebral artery stenosis     Past Surgical History:  Procedure Laterality Date   AORTIC VALVE REPAIR     COLON SURGERY     HERNIA REPAIR     SMALL INTESTINE SURGERY      Current Medications: No outpatient medications have been marked as taking for the 04/23/21 encounter (Appointment) with Richardo Priest, MD.     Allergies:   Penicillins, Ramipril, Vancomycin, and Oxycodone   Social History   Socioeconomic History   Marital status: Widowed    Spouse name: Not on file   Number of  children: Not on file   Years of education: Not on file   Highest education level: Not on file  Occupational History   Not on file  Tobacco Use   Smoking status: Former    Years: 0.00    Types: Cigarettes   Smokeless tobacco: Never   Tobacco comments:    Smoked 2 packs/ week as a teenager   Vaping Use   Vaping Use: Never used  Substance and Sexual Activity   Alcohol use: No    Alcohol/week: 0.0 standard drinks   Drug use: No   Sexual activity: Not on file  Other Topics Concern   Not on file  Social History Narrative   Not on file   Social Determinants of Health   Financial Resource Strain: Not on file  Food Insecurity: Not on file  Transportation Needs: Not on file  Physical Activity: Not on file  Stress: Not on file  Social Connections: Not on file     Family History: The patient's ***family history includes Bladder Cancer in his brother; Breast cancer in his sister; Heart attack in his father; Heart disease in his father. ROS:   Please see the history of present illness.    All other systems reviewed and are negative.  EKGs/Labs/Other Studies Reviewed:    The following studies were reviewed today:  EKG:  EKG ordered today and personally reviewed.  The ekg ordered today demonstrates ***  Recent Labs: No results found for requested labs within last 8760 hours.  Recent Lipid Panel No results found for: CHOL, TRIG, HDL, CHOLHDL, VLDL, LDLCALC, LDLDIRECT  Physical Exam:    VS:  There were no vitals taken for this visit.    Wt Readings from Last 3 Encounters:  05/09/20 179 lb 10.8 oz (81.5 kg)  10/05/19 179 lb 9.6 oz (81.5 kg)  07/02/19 177 lb 12.8 oz (80.6 kg)     GEN: *** Well nourished, well developed in no acute distress HEENT: Normal NECK: No JVD; No carotid bruits LYMPHATICS: No lymphadenopathy CARDIAC: ***RRR, no murmurs, rubs, gallops RESPIRATORY:  Clear to auscultation without rales, wheezing or rhonchi  ABDOMEN: Soft, non-tender,  non-distended MUSCULOSKELETAL:  No edema; No deformity  SKIN: Warm and dry NEUROLOGIC:  Alert and oriented x 3 PSYCHIATRIC:  Normal affect    Signed, Shirlee More, MD  04/23/2021 7:35 AM    [place]

## 2021-04-30 DIAGNOSIS — Z9889 Other specified postprocedural states: Secondary | ICD-10-CM | POA: Diagnosis not present

## 2021-04-30 DIAGNOSIS — Z45018 Encounter for adjustment and management of other part of cardiac pacemaker: Secondary | ICD-10-CM | POA: Diagnosis not present

## 2021-04-30 DIAGNOSIS — I509 Heart failure, unspecified: Secondary | ICD-10-CM | POA: Diagnosis not present

## 2021-04-30 DIAGNOSIS — I4891 Unspecified atrial fibrillation: Secondary | ICD-10-CM | POA: Diagnosis not present

## 2021-04-30 DIAGNOSIS — Z950 Presence of cardiac pacemaker: Secondary | ICD-10-CM | POA: Diagnosis not present

## 2021-04-30 DIAGNOSIS — Z87891 Personal history of nicotine dependence: Secondary | ICD-10-CM | POA: Diagnosis not present

## 2021-04-30 DIAGNOSIS — I420 Dilated cardiomyopathy: Secondary | ICD-10-CM | POA: Diagnosis not present

## 2021-04-30 DIAGNOSIS — I495 Sick sinus syndrome: Secondary | ICD-10-CM | POA: Diagnosis not present

## 2021-04-30 DIAGNOSIS — Z8673 Personal history of transient ischemic attack (TIA), and cerebral infarction without residual deficits: Secondary | ICD-10-CM | POA: Diagnosis not present

## 2021-05-01 DIAGNOSIS — E1122 Type 2 diabetes mellitus with diabetic chronic kidney disease: Secondary | ICD-10-CM | POA: Diagnosis not present

## 2021-05-01 DIAGNOSIS — E11649 Type 2 diabetes mellitus with hypoglycemia without coma: Secondary | ICD-10-CM | POA: Diagnosis not present

## 2021-05-01 DIAGNOSIS — I5033 Acute on chronic diastolic (congestive) heart failure: Secondary | ICD-10-CM | POA: Diagnosis not present

## 2021-05-01 DIAGNOSIS — Z20822 Contact with and (suspected) exposure to covid-19: Secondary | ICD-10-CM | POA: Diagnosis not present

## 2021-05-01 DIAGNOSIS — I5020 Unspecified systolic (congestive) heart failure: Secondary | ICD-10-CM | POA: Diagnosis not present

## 2021-05-01 DIAGNOSIS — F03A0 Unspecified dementia, mild, without behavioral disturbance, psychotic disturbance, mood disturbance, and anxiety: Secondary | ICD-10-CM | POA: Diagnosis not present

## 2021-05-01 DIAGNOSIS — I420 Dilated cardiomyopathy: Secondary | ICD-10-CM | POA: Diagnosis not present

## 2021-05-01 DIAGNOSIS — R2681 Unsteadiness on feet: Secondary | ICD-10-CM | POA: Diagnosis not present

## 2021-05-01 DIAGNOSIS — Z880 Allergy status to penicillin: Secondary | ICD-10-CM | POA: Diagnosis not present

## 2021-05-01 DIAGNOSIS — Z794 Long term (current) use of insulin: Secondary | ICD-10-CM | POA: Diagnosis not present

## 2021-05-01 DIAGNOSIS — I484 Atypical atrial flutter: Secondary | ICD-10-CM | POA: Diagnosis not present

## 2021-05-01 DIAGNOSIS — I509 Heart failure, unspecified: Secondary | ICD-10-CM | POA: Diagnosis not present

## 2021-05-01 DIAGNOSIS — I5022 Chronic systolic (congestive) heart failure: Secondary | ICD-10-CM | POA: Diagnosis not present

## 2021-05-01 DIAGNOSIS — Z9114 Patient's other noncompliance with medication regimen: Secondary | ICD-10-CM | POA: Diagnosis not present

## 2021-05-01 DIAGNOSIS — R5383 Other fatigue: Secondary | ICD-10-CM | POA: Diagnosis not present

## 2021-05-01 DIAGNOSIS — E876 Hypokalemia: Secondary | ICD-10-CM | POA: Diagnosis not present

## 2021-05-01 DIAGNOSIS — Z950 Presence of cardiac pacemaker: Secondary | ICD-10-CM | POA: Diagnosis not present

## 2021-05-01 DIAGNOSIS — Z5986 Financial insecurity: Secondary | ICD-10-CM | POA: Diagnosis not present

## 2021-05-01 DIAGNOSIS — Z87891 Personal history of nicotine dependence: Secondary | ICD-10-CM | POA: Diagnosis not present

## 2021-05-01 DIAGNOSIS — E1151 Type 2 diabetes mellitus with diabetic peripheral angiopathy without gangrene: Secondary | ICD-10-CM | POA: Diagnosis not present

## 2021-05-01 DIAGNOSIS — R479 Unspecified speech disturbances: Secondary | ICD-10-CM | POA: Diagnosis not present

## 2021-05-01 DIAGNOSIS — I130 Hypertensive heart and chronic kidney disease with heart failure and stage 1 through stage 4 chronic kidney disease, or unspecified chronic kidney disease: Secondary | ICD-10-CM | POA: Diagnosis not present

## 2021-05-01 DIAGNOSIS — I4819 Other persistent atrial fibrillation: Secondary | ICD-10-CM | POA: Diagnosis not present

## 2021-05-01 DIAGNOSIS — I4892 Unspecified atrial flutter: Secondary | ICD-10-CM | POA: Diagnosis not present

## 2021-05-01 DIAGNOSIS — R7989 Other specified abnormal findings of blood chemistry: Secondary | ICD-10-CM | POA: Diagnosis not present

## 2021-05-01 DIAGNOSIS — I2510 Atherosclerotic heart disease of native coronary artery without angina pectoris: Secondary | ICD-10-CM | POA: Diagnosis not present

## 2021-05-01 DIAGNOSIS — E785 Hyperlipidemia, unspecified: Secondary | ICD-10-CM | POA: Diagnosis not present

## 2021-05-01 DIAGNOSIS — N183 Chronic kidney disease, stage 3 unspecified: Secondary | ICD-10-CM | POA: Diagnosis not present

## 2021-05-01 DIAGNOSIS — R531 Weakness: Secondary | ICD-10-CM | POA: Diagnosis not present

## 2021-05-01 DIAGNOSIS — I712 Thoracic aortic aneurysm, without rupture, unspecified: Secondary | ICD-10-CM | POA: Diagnosis not present

## 2021-05-01 DIAGNOSIS — Z5309 Procedure and treatment not carried out because of other contraindication: Secondary | ICD-10-CM | POA: Diagnosis not present

## 2021-05-01 DIAGNOSIS — R4701 Aphasia: Secondary | ICD-10-CM | POA: Diagnosis not present

## 2021-05-01 DIAGNOSIS — I110 Hypertensive heart disease with heart failure: Secondary | ICD-10-CM | POA: Diagnosis not present

## 2021-05-01 DIAGNOSIS — Z9889 Other specified postprocedural states: Secondary | ICD-10-CM | POA: Diagnosis not present

## 2021-05-02 DIAGNOSIS — E11649 Type 2 diabetes mellitus with hypoglycemia without coma: Secondary | ICD-10-CM | POA: Diagnosis not present

## 2021-05-02 DIAGNOSIS — I5020 Unspecified systolic (congestive) heart failure: Secondary | ICD-10-CM | POA: Diagnosis not present

## 2021-05-02 DIAGNOSIS — R531 Weakness: Secondary | ICD-10-CM | POA: Diagnosis not present

## 2021-05-02 DIAGNOSIS — I4892 Unspecified atrial flutter: Secondary | ICD-10-CM | POA: Diagnosis not present

## 2021-05-02 DIAGNOSIS — Z794 Long term (current) use of insulin: Secondary | ICD-10-CM | POA: Diagnosis not present

## 2021-05-02 DIAGNOSIS — E876 Hypokalemia: Secondary | ICD-10-CM | POA: Diagnosis not present

## 2021-05-02 DIAGNOSIS — Z950 Presence of cardiac pacemaker: Secondary | ICD-10-CM | POA: Diagnosis not present

## 2021-05-02 DIAGNOSIS — R4701 Aphasia: Secondary | ICD-10-CM | POA: Diagnosis not present

## 2021-05-03 DIAGNOSIS — I5023 Acute on chronic systolic (congestive) heart failure: Secondary | ICD-10-CM | POA: Diagnosis not present

## 2021-05-03 DIAGNOSIS — I4892 Unspecified atrial flutter: Secondary | ICD-10-CM | POA: Diagnosis not present

## 2021-05-03 DIAGNOSIS — I959 Hypotension, unspecified: Secondary | ICD-10-CM | POA: Diagnosis not present

## 2021-05-03 DIAGNOSIS — R4701 Aphasia: Secondary | ICD-10-CM | POA: Diagnosis not present

## 2021-05-03 DIAGNOSIS — R531 Weakness: Secondary | ICD-10-CM | POA: Diagnosis not present

## 2021-05-03 DIAGNOSIS — I480 Paroxysmal atrial fibrillation: Secondary | ICD-10-CM | POA: Diagnosis not present

## 2021-05-03 DIAGNOSIS — I110 Hypertensive heart disease with heart failure: Secondary | ICD-10-CM | POA: Diagnosis not present

## 2021-05-03 DIAGNOSIS — I4819 Other persistent atrial fibrillation: Secondary | ICD-10-CM | POA: Diagnosis not present

## 2021-05-03 DIAGNOSIS — Z794 Long term (current) use of insulin: Secondary | ICD-10-CM | POA: Diagnosis not present

## 2021-05-03 DIAGNOSIS — E876 Hypokalemia: Secondary | ICD-10-CM | POA: Diagnosis not present

## 2021-05-03 DIAGNOSIS — E11649 Type 2 diabetes mellitus with hypoglycemia without coma: Secondary | ICD-10-CM | POA: Diagnosis not present

## 2021-05-03 DIAGNOSIS — I420 Dilated cardiomyopathy: Secondary | ICD-10-CM | POA: Diagnosis not present

## 2021-05-03 DIAGNOSIS — Z952 Presence of prosthetic heart valve: Secondary | ICD-10-CM | POA: Diagnosis not present

## 2021-05-03 DIAGNOSIS — I5020 Unspecified systolic (congestive) heart failure: Secondary | ICD-10-CM | POA: Diagnosis not present

## 2021-05-03 DIAGNOSIS — R55 Syncope and collapse: Secondary | ICD-10-CM | POA: Diagnosis not present

## 2021-05-03 DIAGNOSIS — Z950 Presence of cardiac pacemaker: Secondary | ICD-10-CM | POA: Diagnosis not present

## 2021-05-04 DIAGNOSIS — I4892 Unspecified atrial flutter: Secondary | ICD-10-CM | POA: Diagnosis not present

## 2021-05-04 DIAGNOSIS — I771 Stricture of artery: Secondary | ICD-10-CM | POA: Diagnosis not present

## 2021-05-04 DIAGNOSIS — Z5309 Procedure and treatment not carried out because of other contraindication: Secondary | ICD-10-CM | POA: Diagnosis not present

## 2021-05-04 DIAGNOSIS — I5020 Unspecified systolic (congestive) heart failure: Secondary | ICD-10-CM | POA: Diagnosis not present

## 2021-05-04 DIAGNOSIS — E11649 Type 2 diabetes mellitus with hypoglycemia without coma: Secondary | ICD-10-CM | POA: Diagnosis not present

## 2021-05-04 DIAGNOSIS — E876 Hypokalemia: Secondary | ICD-10-CM | POA: Diagnosis not present

## 2021-05-04 DIAGNOSIS — R4701 Aphasia: Secondary | ICD-10-CM | POA: Diagnosis not present

## 2021-05-04 DIAGNOSIS — Z950 Presence of cardiac pacemaker: Secondary | ICD-10-CM | POA: Diagnosis not present

## 2021-05-04 DIAGNOSIS — R531 Weakness: Secondary | ICD-10-CM | POA: Diagnosis not present

## 2021-05-04 DIAGNOSIS — I871 Compression of vein: Secondary | ICD-10-CM | POA: Diagnosis not present

## 2021-05-04 DIAGNOSIS — I5022 Chronic systolic (congestive) heart failure: Secondary | ICD-10-CM | POA: Diagnosis not present

## 2021-05-04 DIAGNOSIS — I130 Hypertensive heart and chronic kidney disease with heart failure and stage 1 through stage 4 chronic kidney disease, or unspecified chronic kidney disease: Secondary | ICD-10-CM | POA: Diagnosis not present

## 2021-05-04 DIAGNOSIS — Z794 Long term (current) use of insulin: Secondary | ICD-10-CM | POA: Diagnosis not present

## 2021-05-05 DIAGNOSIS — I5020 Unspecified systolic (congestive) heart failure: Secondary | ICD-10-CM | POA: Diagnosis not present

## 2021-05-05 DIAGNOSIS — Z7901 Long term (current) use of anticoagulants: Secondary | ICD-10-CM | POA: Diagnosis not present

## 2021-05-05 DIAGNOSIS — R531 Weakness: Secondary | ICD-10-CM | POA: Diagnosis not present

## 2021-05-05 DIAGNOSIS — I081 Rheumatic disorders of both mitral and tricuspid valves: Secondary | ICD-10-CM | POA: Diagnosis not present

## 2021-05-05 DIAGNOSIS — I4892 Unspecified atrial flutter: Secondary | ICD-10-CM | POA: Diagnosis not present

## 2021-05-05 DIAGNOSIS — Z794 Long term (current) use of insulin: Secondary | ICD-10-CM | POA: Diagnosis not present

## 2021-05-05 DIAGNOSIS — E876 Hypokalemia: Secondary | ICD-10-CM | POA: Diagnosis not present

## 2021-05-05 DIAGNOSIS — R4701 Aphasia: Secondary | ICD-10-CM | POA: Diagnosis not present

## 2021-05-05 DIAGNOSIS — Z952 Presence of prosthetic heart valve: Secondary | ICD-10-CM | POA: Diagnosis not present

## 2021-05-05 DIAGNOSIS — E119 Type 2 diabetes mellitus without complications: Secondary | ICD-10-CM | POA: Diagnosis not present

## 2021-05-06 DIAGNOSIS — I4892 Unspecified atrial flutter: Secondary | ICD-10-CM | POA: Diagnosis not present

## 2021-05-06 DIAGNOSIS — E876 Hypokalemia: Secondary | ICD-10-CM | POA: Diagnosis not present

## 2021-05-06 DIAGNOSIS — Z7901 Long term (current) use of anticoagulants: Secondary | ICD-10-CM | POA: Diagnosis not present

## 2021-05-06 DIAGNOSIS — E119 Type 2 diabetes mellitus without complications: Secondary | ICD-10-CM | POA: Diagnosis not present

## 2021-05-06 DIAGNOSIS — I5020 Unspecified systolic (congestive) heart failure: Secondary | ICD-10-CM | POA: Diagnosis not present

## 2021-05-06 DIAGNOSIS — R531 Weakness: Secondary | ICD-10-CM | POA: Diagnosis not present

## 2021-05-06 DIAGNOSIS — Z794 Long term (current) use of insulin: Secondary | ICD-10-CM | POA: Diagnosis not present

## 2021-05-06 DIAGNOSIS — R4701 Aphasia: Secondary | ICD-10-CM | POA: Diagnosis not present

## 2021-05-07 DIAGNOSIS — N183 Chronic kidney disease, stage 3 unspecified: Secondary | ICD-10-CM | POA: Diagnosis not present

## 2021-05-07 DIAGNOSIS — E876 Hypokalemia: Secondary | ICD-10-CM | POA: Diagnosis not present

## 2021-05-07 DIAGNOSIS — Z794 Long term (current) use of insulin: Secondary | ICD-10-CM | POA: Diagnosis not present

## 2021-05-07 DIAGNOSIS — R531 Weakness: Secondary | ICD-10-CM | POA: Diagnosis not present

## 2021-05-07 DIAGNOSIS — R4701 Aphasia: Secondary | ICD-10-CM | POA: Diagnosis not present

## 2021-05-07 DIAGNOSIS — Z950 Presence of cardiac pacemaker: Secondary | ICD-10-CM | POA: Diagnosis not present

## 2021-05-07 DIAGNOSIS — E1122 Type 2 diabetes mellitus with diabetic chronic kidney disease: Secondary | ICD-10-CM | POA: Diagnosis not present

## 2021-05-07 DIAGNOSIS — I4892 Unspecified atrial flutter: Secondary | ICD-10-CM | POA: Diagnosis not present

## 2021-05-07 DIAGNOSIS — I5020 Unspecified systolic (congestive) heart failure: Secondary | ICD-10-CM | POA: Diagnosis not present

## 2021-05-08 DIAGNOSIS — M6281 Muscle weakness (generalized): Secondary | ICD-10-CM | POA: Diagnosis not present

## 2021-05-08 DIAGNOSIS — Z7901 Long term (current) use of anticoagulants: Secondary | ICD-10-CM | POA: Diagnosis not present

## 2021-05-08 DIAGNOSIS — F0390 Unspecified dementia without behavioral disturbance: Secondary | ICD-10-CM | POA: Diagnosis not present

## 2021-05-08 DIAGNOSIS — I499 Cardiac arrhythmia, unspecified: Secondary | ICD-10-CM | POA: Diagnosis not present

## 2021-05-08 DIAGNOSIS — R3912 Poor urinary stream: Secondary | ICD-10-CM | POA: Diagnosis not present

## 2021-05-08 DIAGNOSIS — I10 Essential (primary) hypertension: Secondary | ICD-10-CM | POA: Diagnosis not present

## 2021-05-08 DIAGNOSIS — E119 Type 2 diabetes mellitus without complications: Secondary | ICD-10-CM | POA: Diagnosis not present

## 2021-05-08 DIAGNOSIS — Z743 Need for continuous supervision: Secondary | ICD-10-CM | POA: Diagnosis not present

## 2021-05-08 DIAGNOSIS — I5022 Chronic systolic (congestive) heart failure: Secondary | ICD-10-CM | POA: Diagnosis not present

## 2021-05-08 DIAGNOSIS — R2689 Other abnormalities of gait and mobility: Secondary | ICD-10-CM | POA: Diagnosis not present

## 2021-05-08 DIAGNOSIS — I480 Paroxysmal atrial fibrillation: Secondary | ICD-10-CM | POA: Diagnosis not present

## 2021-05-08 DIAGNOSIS — E785 Hyperlipidemia, unspecified: Secondary | ICD-10-CM | POA: Diagnosis not present

## 2021-05-08 DIAGNOSIS — R41841 Cognitive communication deficit: Secondary | ICD-10-CM | POA: Diagnosis not present

## 2021-05-08 DIAGNOSIS — R531 Weakness: Secondary | ICD-10-CM | POA: Diagnosis not present

## 2021-05-08 DIAGNOSIS — I5042 Chronic combined systolic (congestive) and diastolic (congestive) heart failure: Secondary | ICD-10-CM | POA: Diagnosis not present

## 2021-05-08 DIAGNOSIS — Z794 Long term (current) use of insulin: Secondary | ICD-10-CM | POA: Diagnosis not present

## 2021-05-08 DIAGNOSIS — Z950 Presence of cardiac pacemaker: Secondary | ICD-10-CM | POA: Diagnosis not present

## 2021-05-08 DIAGNOSIS — I2510 Atherosclerotic heart disease of native coronary artery without angina pectoris: Secondary | ICD-10-CM | POA: Diagnosis not present

## 2021-05-08 DIAGNOSIS — N183 Chronic kidney disease, stage 3 unspecified: Secondary | ICD-10-CM | POA: Diagnosis not present

## 2021-05-08 DIAGNOSIS — R2681 Unsteadiness on feet: Secondary | ICD-10-CM | POA: Diagnosis not present

## 2021-05-08 DIAGNOSIS — R4701 Aphasia: Secondary | ICD-10-CM | POA: Diagnosis not present

## 2021-05-08 DIAGNOSIS — I4892 Unspecified atrial flutter: Secondary | ICD-10-CM | POA: Diagnosis not present

## 2021-05-08 DIAGNOSIS — I4891 Unspecified atrial fibrillation: Secondary | ICD-10-CM | POA: Diagnosis not present

## 2021-05-08 DIAGNOSIS — Z9181 History of falling: Secondary | ICD-10-CM | POA: Diagnosis not present

## 2021-05-08 DIAGNOSIS — I5032 Chronic diastolic (congestive) heart failure: Secondary | ICD-10-CM | POA: Diagnosis not present

## 2021-05-08 DIAGNOSIS — C61 Malignant neoplasm of prostate: Secondary | ICD-10-CM | POA: Diagnosis not present

## 2021-05-08 DIAGNOSIS — E1122 Type 2 diabetes mellitus with diabetic chronic kidney disease: Secondary | ICD-10-CM | POA: Diagnosis not present

## 2021-05-08 DIAGNOSIS — E1129 Type 2 diabetes mellitus with other diabetic kidney complication: Secondary | ICD-10-CM | POA: Diagnosis not present

## 2021-05-08 DIAGNOSIS — I110 Hypertensive heart disease with heart failure: Secondary | ICD-10-CM | POA: Diagnosis not present

## 2021-05-11 DIAGNOSIS — R4701 Aphasia: Secondary | ICD-10-CM | POA: Diagnosis not present

## 2021-05-11 DIAGNOSIS — I4892 Unspecified atrial flutter: Secondary | ICD-10-CM | POA: Diagnosis not present

## 2021-05-11 DIAGNOSIS — I5042 Chronic combined systolic (congestive) and diastolic (congestive) heart failure: Secondary | ICD-10-CM | POA: Diagnosis not present

## 2021-05-11 DIAGNOSIS — R531 Weakness: Secondary | ICD-10-CM | POA: Diagnosis not present

## 2021-05-11 DIAGNOSIS — C61 Malignant neoplasm of prostate: Secondary | ICD-10-CM | POA: Diagnosis not present

## 2021-05-11 DIAGNOSIS — R3912 Poor urinary stream: Secondary | ICD-10-CM | POA: Diagnosis not present

## 2021-05-12 DIAGNOSIS — I5032 Chronic diastolic (congestive) heart failure: Secondary | ICD-10-CM | POA: Diagnosis not present

## 2021-05-12 DIAGNOSIS — E1129 Type 2 diabetes mellitus with other diabetic kidney complication: Secondary | ICD-10-CM | POA: Diagnosis not present

## 2021-05-12 DIAGNOSIS — I10 Essential (primary) hypertension: Secondary | ICD-10-CM | POA: Diagnosis not present

## 2021-05-12 DIAGNOSIS — E785 Hyperlipidemia, unspecified: Secondary | ICD-10-CM | POA: Diagnosis not present

## 2021-05-14 DIAGNOSIS — S51011A Laceration without foreign body of right elbow, initial encounter: Secondary | ICD-10-CM | POA: Diagnosis not present

## 2021-05-15 DIAGNOSIS — Z6826 Body mass index (BMI) 26.0-26.9, adult: Secondary | ICD-10-CM | POA: Diagnosis not present

## 2021-05-15 DIAGNOSIS — G6189 Other inflammatory polyneuropathies: Secondary | ICD-10-CM | POA: Diagnosis not present

## 2021-05-15 DIAGNOSIS — D6869 Other thrombophilia: Secondary | ICD-10-CM | POA: Diagnosis not present

## 2021-05-15 DIAGNOSIS — I70203 Unspecified atherosclerosis of native arteries of extremities, bilateral legs: Secondary | ICD-10-CM | POA: Diagnosis not present

## 2021-05-15 DIAGNOSIS — E1159 Type 2 diabetes mellitus with other circulatory complications: Secondary | ICD-10-CM | POA: Diagnosis not present

## 2021-05-15 DIAGNOSIS — I480 Paroxysmal atrial fibrillation: Secondary | ICD-10-CM | POA: Diagnosis not present

## 2021-05-15 DIAGNOSIS — C61 Malignant neoplasm of prostate: Secondary | ICD-10-CM | POA: Diagnosis not present

## 2021-05-15 DIAGNOSIS — E1151 Type 2 diabetes mellitus with diabetic peripheral angiopathy without gangrene: Secondary | ICD-10-CM | POA: Diagnosis not present

## 2021-05-15 DIAGNOSIS — H353290 Exudative age-related macular degeneration, unspecified eye, stage unspecified: Secondary | ICD-10-CM | POA: Diagnosis not present

## 2021-05-15 DIAGNOSIS — I253 Aneurysm of heart: Secondary | ICD-10-CM | POA: Diagnosis not present

## 2021-05-15 DIAGNOSIS — I509 Heart failure, unspecified: Secondary | ICD-10-CM | POA: Diagnosis not present

## 2021-05-15 DIAGNOSIS — E261 Secondary hyperaldosteronism: Secondary | ICD-10-CM | POA: Diagnosis not present

## 2021-05-21 DIAGNOSIS — Z6824 Body mass index (BMI) 24.0-24.9, adult: Secondary | ICD-10-CM | POA: Diagnosis not present

## 2021-05-21 DIAGNOSIS — I5032 Chronic diastolic (congestive) heart failure: Secondary | ICD-10-CM | POA: Diagnosis not present

## 2021-05-21 DIAGNOSIS — I10 Essential (primary) hypertension: Secondary | ICD-10-CM | POA: Diagnosis not present

## 2021-05-21 DIAGNOSIS — D638 Anemia in other chronic diseases classified elsewhere: Secondary | ICD-10-CM | POA: Diagnosis not present

## 2021-05-21 DIAGNOSIS — G8191 Hemiplegia, unspecified affecting right dominant side: Secondary | ICD-10-CM | POA: Diagnosis not present

## 2021-05-21 DIAGNOSIS — E1129 Type 2 diabetes mellitus with other diabetic kidney complication: Secondary | ICD-10-CM | POA: Diagnosis not present

## 2021-05-21 DIAGNOSIS — E785 Hyperlipidemia, unspecified: Secondary | ICD-10-CM | POA: Diagnosis not present

## 2021-05-26 DIAGNOSIS — N281 Cyst of kidney, acquired: Secondary | ICD-10-CM | POA: Diagnosis not present

## 2021-05-26 DIAGNOSIS — C61 Malignant neoplasm of prostate: Secondary | ICD-10-CM | POA: Diagnosis not present

## 2021-06-01 DIAGNOSIS — C61 Malignant neoplasm of prostate: Secondary | ICD-10-CM | POA: Diagnosis not present

## 2021-06-03 DIAGNOSIS — R102 Pelvic and perineal pain: Secondary | ICD-10-CM | POA: Diagnosis not present

## 2021-06-03 DIAGNOSIS — C61 Malignant neoplasm of prostate: Secondary | ICD-10-CM | POA: Diagnosis not present

## 2021-06-09 DIAGNOSIS — I5032 Chronic diastolic (congestive) heart failure: Secondary | ICD-10-CM | POA: Diagnosis not present

## 2021-06-09 DIAGNOSIS — E1129 Type 2 diabetes mellitus with other diabetic kidney complication: Secondary | ICD-10-CM | POA: Diagnosis not present

## 2021-06-09 DIAGNOSIS — E785 Hyperlipidemia, unspecified: Secondary | ICD-10-CM | POA: Diagnosis not present

## 2021-06-09 DIAGNOSIS — I10 Essential (primary) hypertension: Secondary | ICD-10-CM | POA: Diagnosis not present

## 2021-06-10 DIAGNOSIS — C61 Malignant neoplasm of prostate: Secondary | ICD-10-CM | POA: Diagnosis not present

## 2021-06-10 DIAGNOSIS — K8020 Calculus of gallbladder without cholecystitis without obstruction: Secondary | ICD-10-CM | POA: Diagnosis not present

## 2021-06-10 DIAGNOSIS — K8689 Other specified diseases of pancreas: Secondary | ICD-10-CM | POA: Diagnosis not present

## 2021-06-10 DIAGNOSIS — I700 Atherosclerosis of aorta: Secondary | ICD-10-CM | POA: Diagnosis not present

## 2021-06-10 DIAGNOSIS — I7123 Aneurysm of the descending thoracic aorta, without rupture: Secondary | ICD-10-CM | POA: Diagnosis not present

## 2021-06-16 ENCOUNTER — Encounter: Payer: Self-pay | Admitting: *Deleted

## 2021-06-16 ENCOUNTER — Encounter: Payer: Self-pay | Admitting: Cardiology

## 2021-06-16 NOTE — Progress Notes (Signed)
Cardiology Office Note:    Date:  06/17/2021   ID:  Jonathan Ryan, DOB 03-01-1929, MRN [id number]  PCP:  Nicoletta Dress, MD  Cardiologist:  Shirlee More, MD    Referring MD: Nicoletta Dress, MD    ASSESSMENT:    1. S/P AVR (aortic valve replacement)   2. Chronic anticoagulation   3. Pacemaker   4. Atrial flutter, unspecified type ([place])   5. Hypertensive heart disease with heart failure ([place])   6. Mild CAD   7. Thoracic aortic aneurysm without rupture, unspecified part   8. Hypokalemia   9. Orthostatic hypotension    PLAN:    In order of problems listed above:  Is a very difficult complex visit.  He is taking it appears to be 20 mg of Lasix a day has a history of heart failure he is edematous we will need to increase to 40 mg a day.  He is taking a dose of anticoagulant which is excessive for his age and renal function will drop to half milligrams daily he needs to transition to our EP pacemaker device clinic and I would not advise any further interventional cardiac procedures for his atrial arrhythmia. He has complex thoracic aortic disease and is not a candidate for intervention I would not repeat imaging Stable CAD Stable surgical aortic valve replacement Hypokalemia has improved his labs checked through his primary care physician   Next appointment: 3 months   Medication Adjustments/Labs and Tests Ordered: Current medicines are reviewed at length with the patient today.  Concerns regarding medicines are outlined above.  No orders of the defined types were placed in this encounter.  No orders of the defined types were placed in this encounter.  Chief complaint, I want you to see my cardiologist   History of Present Illness:    Jonathan Ryan is a 88 y.o. male with a hx of mild nonobstructive CAD surgical aortic valve replacement 2013 hyperlipidemia diabetes heart failure and complex thoracic aortic aneurysm followed by specialist at [facility] and  recurrent syncope and near syncope due to orthostatic hypotension last seen by me 11/29/2018.  At that time was improved on midodrine therapy and had stable heart failure.  [facility] he had revision of his pacemaker planning to upgrade to biventricular which was abandoned because of bilateral subclavian vein thrombosis.  He is followed at [facility] cardiothoracic surgery for his thoracic aortic disease.  He was last seen 07/24/2020 ascending aorta was measured at 52 mm transverse 6.4 cm.  Again decision was made not to pursue surgical intervention.  Patient pacemaker insertion [facility] in 2020 and had ablation for atrial flutter/.  He was also found that admission to have potassium of 2.8.  Compliance with diet, lifestyle and medications: Yes  His daughter is present she has become actively involved in her care is becoming power of attorney and he signed himself out of skilled nursing and went home There is a great deal of confusion about his medications I reviewed with him that his rhythm is atrial flutter and he is ventricularly paced They want [place] to take over his pacemaker management He is having chronic abdominal pain I referred him to his primary care physician He has had no further syncope I reviewed with him that he has been seen previously for his complex thoracic aortic disease at [facility] was not felt to be candidate for surgical intervention I do no  further diagnostic testing. They are unsure about his dietary sodium intake He has increased peripheral edema and seems to be on a lower dose of a diuretic  Echocardiogram [facility] 05/05/2021: SUMMARY  The left ventricular size is normal.  There is mild concentric left ventricular hypertrophy.  Left ventricular systolic function is mildly reduced.  LV ejection fraction = 40-45%.  Abnormal (paradoxical) septal motion consistent with RV pacemaker.  There are  regional wall motion abnormalities as specified below.  There is apical LV wall akinesis  The right ventricle is normal in size and function.  The left atrium is mildly dilated.  There is moderate tricuspid regurgitation.  Mild pulmonary hypertension.  The aortic sinus is normal size.  Ascending Aorta measures 5.2cm.  IVC size was normal.  There is no pericardial effusion.  Compared to prior study dated 03/02/21, the ascending aorta is further  dilated, now at 5.2 cm.   Past Medical History:  Diagnosis Date   Aortic valve disorder 06/11/2015   Arthritis    Blood transfusion without reported diagnosis    CAD (coronary artery disease)    Carotid artery disease ([place]) 06/11/2015   Cataract    CHF (congestive heart failure) (HCC)    Chronic diastolic heart failure ([place]) 05/05/2017   Colostomy in place [facility]) 06/11/2015   Coronary artery disease    Coronary artery disease involving native coronary artery of native heart without angina pectoris 01/06/2015   Descending thoracic aortic aneurysm    Diabetes mellitus without complication ([place])    Diverticular disease of left colon    Emphysema of lung ([place])    as a child   GERD (gastroesophageal reflux disease)    HTN (hypertension) 06/11/2015   Hyperlipidemia 01/06/2015   Hypertension    Hypokalemia 02/03/2019   Mild CAD 01/06/2015   History of mild CAD with cardiac catheterization in 2013 showing 20% stenosis in the LAD and proximal and 30% stenosis in the Mid-LCx.    Near syncope 06/10/2015   Postural dizziness with presyncope 08/07/2018   Pre-syncope 06/10/2015   Pulmonary embolism ([place])    Right hip pain 12/15/2016   Overview:  Added automatically from request for surgery [id number] Overview:  Added automatically from request for surgery [id number]   S/P aortic valve replacement with bioprosthetic valve 01/06/2015   SSS (sick sinus syndrome) ([place]) 09/25/2018   Statin intolerance 01/06/2015   Stroke ([place])    08/01/2015   Thoracic aortic aneurysm without  rupture 01/06/2015   Thoracic ascending aortic aneurysm    Thyroid disease    reports nodules   Type 2 diabetes mellitus with complication ([place]) 05/17/5807   Vertebral artery stenosis     Past Surgical History:  Procedure Laterality Date   AORTIC VALVE REPAIR     COLON SURGERY     HERNIA REPAIR     SMALL INTESTINE SURGERY      Current Medications: Current Meds  Medication Sig   albuterol (PROVENTIL) (2.5 MG/3ML) 0.083% nebulizer solution Take 3 mLs (2.5 mg total) by nebulization every 6 (six) hours as needed for wheezing or shortness of breath. Take daily in am with hypertonic saline nebs.   apixaban (ELIQUIS) 5 MG TABS tablet    aspirin 325 MG EC tablet Take 325 mg by mouth daily.   atorvastatin (LIPITOR) 20 MG tablet Take 1 tablet by mouth. once weekly   Cholecalciferol (VITAMIN D3) 1000 units CAPS Take 1 capsule by mouth 2 (two) times daily.    co-enzyme Q-10 30 MG  capsule Take by mouth.   finasteride (PROSCAR) 5 MG tablet Take 5 mg by mouth daily.   furosemide (LASIX) 40 MG tablet Take 20 mg by mouth daily.    insulin detemir (LEVEMIR) 100 UNIT/ML injection Inject 40 Units into the skin at bedtime.    metFORMIN (GLUCOPHAGE) 1000 MG tablet Take 1,000 mg by mouth 2 (two) times daily with a meal.    metoprolol succinate (TOPROL-XL) 25 MG 24 hr tablet Take 12.5 mg by mouth daily. Only takes if BP is high per patient   midodrine (PROAMATINE) 5 MG tablet Take 2.5 mg by mouth 3 (three) times daily with meals.   Multiple Vitamin (MULTI-VITAMINS) TABS Take 1 tablet by mouth daily.    Nutritional Supplements (PROSTA VITE PO) Take 1 tablet by mouth daily.   Respiratory Therapy Supplies (FLUTTER) DEVI 1 Device by Does not apply route as directed.   selenium 50 MCG TABS tablet Take 50 mcg by mouth daily.   sodium chloride HYPERTONIC 3 % nebulizer solution Take by nebulization in the morning and at bedtime. Use flutter valve after using this treatment.   tamsulosin (FLOMAX) 0.4 MG CAPS capsule  Take by mouth.   zinc gluconate 50 MG tablet Take 50 mg by mouth daily.     Allergies:   Penicillins, Ramipril, Vancomycin, and Oxycodone   Social History   Socioeconomic History   Marital status: Widowed    Spouse name: Not on file   Number of children: Not on file   Years of education: Not on file   Highest education level: Not on file  Occupational History   Not on file  Tobacco Use   Smoking status: Former    Years: 0.00    Types: Cigarettes   Smokeless tobacco: Never   Tobacco comments:    Smoked 2 packs/ week as a teenager   Vaping Use   Vaping Use: Never used  Substance and Sexual Activity   Alcohol use: No    Alcohol/week: 0.0 standard drinks   Drug use: No   Sexual activity: Not on file  Other Topics Concern   Not on file  Social History Narrative   Not on file   Social Determinants of Health   Financial Resource Strain: Not on file  Food Insecurity: Not on file  Transportation Needs: Not on file  Physical Activity: Not on file  Stress: Not on file  Social Connections: Not on file     Family History: The patient's family history includes Bladder Cancer in his brother; Breast cancer in his sister; Heart attack in his father; Heart disease in his father. ROS:   Please see the history of present illness.    All other systems reviewed and are negative.  EKGs/Labs/Other Studies Reviewed:    The following studies were reviewed today:  EKG:  EKG ordered today and personally reviewed.  The ekg ordered today demonstrates baseline atrial flutter he is 100% ventricularly paced RV late He had labs done in his PCP office 05/26/2021 Potassium was 3.5 creatinine 1.33 hemoglobin 13.5    Physical Exam:    VS:  BP (!) 120/94 (BP Location: Right Arm, Patient Position: Sitting, Cuff Size: Normal)    Pulse 69    Ht '5\' 9"'$  (1.753 m)    Wt 175 lb (79.4 kg)    SpO2 97%    BMI 25.84 kg/m     Wt Readings from Last 3 Encounters:  06/17/21 175 lb (79.4 kg)  05/09/20  179 lb 10.8 oz (  81.5 kg)  10/05/19 179 lb 9.6 oz (81.5 kg)     GEN: He looks very frail debilitated and weak he is a very poor historian in no acute distress HEENT: Normal NECK: No JVD; No carotid bruits LYMPHATICS: No lymphadenopathy CARDIAC: Paced rhythm RRR, no murmurs, rubs, gallops RESPIRATORY:  Clear to auscultation without rales, wheezing or rhonchi  ABDOMEN: Soft, non-tender, non-distended MUSCULOSKELETAL: 4+ bilateral lower extremity ankle to knee pitting edema; No deformity  SKIN: Warm and dry NEUROLOGIC:  Alert and oriented x 3 PSYCHIATRIC:  Normal affect    Signed, Shirlee More, MD  06/17/2021 10:19 AM    [place]

## 2021-06-17 ENCOUNTER — Ambulatory Visit: Payer: HMO | Admitting: Cardiology

## 2021-06-17 ENCOUNTER — Encounter: Payer: Self-pay | Admitting: Cardiology

## 2021-06-17 ENCOUNTER — Other Ambulatory Visit: Payer: Self-pay

## 2021-06-17 VITALS — BP 120/94 | HR 69 | Ht 69.0 in | Wt 175.0 lb

## 2021-06-17 DIAGNOSIS — Z7901 Long term (current) use of anticoagulants: Secondary | ICD-10-CM

## 2021-06-17 DIAGNOSIS — I4892 Unspecified atrial flutter: Secondary | ICD-10-CM

## 2021-06-17 DIAGNOSIS — Z950 Presence of cardiac pacemaker: Secondary | ICD-10-CM

## 2021-06-17 DIAGNOSIS — Z952 Presence of prosthetic heart valve: Secondary | ICD-10-CM

## 2021-06-17 DIAGNOSIS — I110 Hypertensive heart disease with heart failure: Secondary | ICD-10-CM | POA: Diagnosis not present

## 2021-06-17 DIAGNOSIS — I951 Orthostatic hypotension: Secondary | ICD-10-CM | POA: Diagnosis not present

## 2021-06-17 DIAGNOSIS — I712 Thoracic aortic aneurysm, without rupture, unspecified: Secondary | ICD-10-CM

## 2021-06-17 DIAGNOSIS — E876 Hypokalemia: Secondary | ICD-10-CM | POA: Diagnosis not present

## 2021-06-17 DIAGNOSIS — I2510 Atherosclerotic heart disease of native coronary artery without angina pectoris: Secondary | ICD-10-CM

## 2021-06-17 MED ORDER — APIXABAN 2.5 MG PO TABS: 2.5000 mg | ORAL_TABLET | Freq: Two times a day (BID) | ORAL | 11 refills | Status: DC

## 2021-06-17 NOTE — Addendum Note (Signed)
Addended by: Jerl Santos R on: 06/17/2021 03:55 PM ? ? Modules accepted: Orders ? ?

## 2021-06-17 NOTE — Patient Instructions (Signed)
Medication Instructions:  ?Your physician has recommended you make the following change in your medication: Eliquis 2.5 mg two times daily ?Increase Lasix to 40 mg once daily ? ?*If you need a refill on your cardiac medications before your next appointment, please call your pharmacy* ? ? ?Lab Work: ?NONE ?If you have labs (blood work) drawn today and your tests are completely normal, you will receive your results only by: ?MyChart Message (if you have MyChart) OR ?A paper copy in the mail ?If you have any lab test that is abnormal or we need to change your treatment, we will call you to review the results. ? ? ?Testing/Procedures: ?NONE ? ? ?Follow-Up: ?At [facility], you and your health needs are our priority.  As part of our continuing mission to provide you with exceptional heart care, we have created designated Provider Care Teams.  These Care Teams include your primary Cardiologist (physician) and Advanced Practice Providers (APPs -  Physician Assistants and Nurse Practitioners) who all work together to provide you with the care you need, when you need it. ? ?We recommend signing up for the patient portal called "MyChart".  Sign up information is provided on this After Visit Summary.  MyChart is used to connect with patients for Virtual Visits (Telemedicine).  Patients are able to view lab/test results, encounter notes, upcoming appointments, etc.  Non-urgent messages can be sent to your provider as well.   ?To learn more about what you can do with MyChart, go to [email or web address].   ? ?Your next appointment:   ?3 month(s) ? ?The format for your next appointment:   ?In Person ? ?Provider:   ?Shirlee More, MD  ? ? ?Other Instructions ?  ?

## 2021-06-18 ENCOUNTER — Telehealth: Payer: Self-pay | Admitting: Cardiology

## 2021-06-18 MED ORDER — FUROSEMIDE 40 MG PO TABS: 40.0000 mg | ORAL_TABLET | Freq: Every day | ORAL | 3 refills | Status: DC

## 2021-06-18 NOTE — Telephone Encounter (Signed)
?*  STAT* If patient is at the pharmacy, call can be transferred to refill team. ? ? ?1. Which medications need to be refilled? (please list name of each medication and dose if known)  furosemide (LASIX) 40 MG tablet ? ?2. Which pharmacy/location (including street and city if local pharmacy) is medication to be sent to? [place], [place] ? ?3. Do they need a 30 day or 90 day supply? 90 day  ?

## 2021-06-22 DIAGNOSIS — R102 Pelvic and perineal pain: Secondary | ICD-10-CM | POA: Diagnosis not present

## 2021-06-22 DIAGNOSIS — C61 Malignant neoplasm of prostate: Secondary | ICD-10-CM | POA: Diagnosis not present

## 2021-07-10 DIAGNOSIS — Z933 Colostomy status: Secondary | ICD-10-CM | POA: Diagnosis not present

## 2021-07-10 DIAGNOSIS — E1129 Type 2 diabetes mellitus with other diabetic kidney complication: Secondary | ICD-10-CM | POA: Diagnosis not present

## 2021-07-10 DIAGNOSIS — Z6826 Body mass index (BMI) 26.0-26.9, adult: Secondary | ICD-10-CM | POA: Diagnosis not present

## 2021-07-10 DIAGNOSIS — I480 Paroxysmal atrial fibrillation: Secondary | ICD-10-CM | POA: Diagnosis not present

## 2021-07-10 DIAGNOSIS — D6869 Other thrombophilia: Secondary | ICD-10-CM | POA: Diagnosis not present

## 2021-07-10 DIAGNOSIS — I5032 Chronic diastolic (congestive) heart failure: Secondary | ICD-10-CM | POA: Diagnosis not present

## 2021-07-10 DIAGNOSIS — I509 Heart failure, unspecified: Secondary | ICD-10-CM | POA: Diagnosis not present

## 2021-07-10 DIAGNOSIS — E1159 Type 2 diabetes mellitus with other circulatory complications: Secondary | ICD-10-CM | POA: Diagnosis not present

## 2021-07-10 DIAGNOSIS — Z7901 Long term (current) use of anticoagulants: Secondary | ICD-10-CM | POA: Diagnosis not present

## 2021-07-10 DIAGNOSIS — E261 Secondary hyperaldosteronism: Secondary | ICD-10-CM | POA: Diagnosis not present

## 2021-07-10 DIAGNOSIS — Z794 Long term (current) use of insulin: Secondary | ICD-10-CM | POA: Diagnosis not present

## 2021-07-10 DIAGNOSIS — I10 Essential (primary) hypertension: Secondary | ICD-10-CM | POA: Diagnosis not present

## 2021-07-10 DIAGNOSIS — Z950 Presence of cardiac pacemaker: Secondary | ICD-10-CM | POA: Diagnosis not present

## 2021-07-10 DIAGNOSIS — D692 Other nonthrombocytopenic purpura: Secondary | ICD-10-CM | POA: Diagnosis not present

## 2021-07-11 DIAGNOSIS — E876 Hypokalemia: Secondary | ICD-10-CM | POA: Diagnosis not present

## 2021-07-11 DIAGNOSIS — I110 Hypertensive heart disease with heart failure: Secondary | ICD-10-CM | POA: Diagnosis not present

## 2021-07-11 DIAGNOSIS — I509 Heart failure, unspecified: Secondary | ICD-10-CM | POA: Diagnosis not present

## 2021-07-26 DIAGNOSIS — Z45018 Encounter for adjustment and management of other part of cardiac pacemaker: Secondary | ICD-10-CM | POA: Diagnosis not present

## 2021-07-27 ENCOUNTER — Encounter: Payer: HMO | Admitting: Cardiology

## 2021-07-28 ENCOUNTER — Encounter: Payer: Self-pay | Admitting: *Deleted

## 2021-07-28 ENCOUNTER — Encounter: Payer: Self-pay | Admitting: Cardiology

## 2021-07-28 ENCOUNTER — Ambulatory Visit (INDEPENDENT_AMBULATORY_CARE_PROVIDER_SITE_OTHER): Payer: HMO | Admitting: Cardiology

## 2021-07-28 VITALS — BP 110/72 | HR 69 | Ht 69.0 in | Wt 168.0 lb

## 2021-07-28 DIAGNOSIS — I4892 Unspecified atrial flutter: Secondary | ICD-10-CM

## 2021-07-28 DIAGNOSIS — Z01812 Encounter for preprocedural laboratory examination: Secondary | ICD-10-CM

## 2021-07-28 MED ORDER — AMIODARONE HCL 200 MG PO TABS: ORAL_TABLET | ORAL | 0 refills | Status: DC

## 2021-07-28 MED ORDER — AMIODARONE HCL 200 MG PO TABS: 200.0000 mg | ORAL_TABLET | Freq: Every day | ORAL | 3 refills | Status: DC

## 2021-07-28 MED ORDER — APIXABAN 5 MG PO TABS: 5.0000 mg | ORAL_TABLET | Freq: Two times a day (BID) | ORAL | 6 refills | Status: DC

## 2021-07-28 NOTE — Progress Notes (Signed)
? ?Electrophysiology Office Note ? ? ?Date:  07/28/2021  ? ?ID:  Jonathan Ryan, DOB 1928-10-05, MRN [id number] ? ?PCP:  Nicoletta Dress, MD  ?Cardiologist:  Bettina Gavia ?Primary Electrophysiologist:  Tryson Lumley Meredith Leeds, MD   ? ?Chief Complaint: pacemaker ?  ?History of Present Illness: ?Jonathan Ryan is a 88 y.o. male who is being seen today for the evaluation of pacemaker at the request of Bettina Gavia, Hilton Cork, MD. Presenting today for electrophysiology evaluation. ? ?Has a history significant for mild nonobstructive coronary artery disease, aortic valve replacement in 2013, hyperlipidemia, diabetes, heart failure, thoracic aortic aneurysm followed by [facility], recurrent syncope and near syncope due to orthostatic hypotension.  Orthostatic hypotension was improved on midodrine.  He was recently went to [facility] for revision of his pacemaker planning a biventricular upgrade.  This was abandoned due to bilateral subclavian vein thrombosis. ? ?Today, he denies symptoms of palpitations, chest pain, orthopnea, PND, lower extremity edema, claudication, dizziness, presyncope, syncope, bleeding, or neurologic sequela. The patient is tolerating medications without difficulties.  His main complaint today is fatigue and shortness of breath.  He is able to do most of his daily activities prior to December when his fatigue and shortness of breath began.  In December based on device interrogation, he went into atrial flutter.  He would like to get back into normal rhythm. ? ? ?Past Medical History:  ?Diagnosis Date  ? Aortic valve disorder 06/11/2015  ? Arthritis   ? Blood transfusion without reported diagnosis   ? CAD (coronary artery disease)   ? Carotid artery disease ([place]) 06/11/2015  ? Cataract   ? CHF (congestive heart failure) ([place])   ? Chronic diastolic heart failure ([place]) 05/05/2017  ? Colostomy in place [facility]) 06/11/2015  ? Coronary artery disease   ? Coronary artery disease involving native coronary artery of native heart  without angina pectoris 01/06/2015  ? Descending thoracic aortic aneurysm ([place])   ? Diabetes mellitus without complication ([place])   ? Diverticular disease of left colon   ? Emphysema of lung ([place])   ? as a child  ? GERD (gastroesophageal reflux disease)   ? HTN (hypertension) 06/11/2015  ? Hyperlipidemia 01/06/2015  ? Hypertension   ? Hypokalemia 02/03/2019  ? Mild CAD 01/06/2015  ? History of mild CAD with cardiac catheterization in 2013 showing 20% stenosis in the LAD and proximal and 30% stenosis in the Mid-LCx.   ? Near syncope 06/10/2015  ? Postural dizziness with presyncope 08/07/2018  ? Pre-syncope 06/10/2015  ? Pulmonary embolism ([place])   ? Right hip pain 12/15/2016  ? Overview:  Added automatically from request for surgery [phone] Overview:  Added automatically from request for surgery [phone]  ? S/P aortic valve replacement with bioprosthetic valve 01/06/2015  ? SSS (sick sinus syndrome) ([place]) 09/25/2018  ? Statin intolerance 01/06/2015  ? Stroke [facility])   ? 08/01/2015  ? Thoracic aortic aneurysm without rupture ([place]) 01/06/2015  ? Thoracic ascending aortic aneurysm ([place])   ? Thyroid disease   ? reports nodules  ? Type 2 diabetes mellitus with complication ([place]) 08/17/996  ? Vertebral artery stenosis   ? ?Past Surgical History:  ?Procedure Laterality Date  ? AORTIC VALVE REPAIR    ? COLON SURGERY    ? HERNIA REPAIR    ? SMALL INTESTINE SURGERY    ? ? ? ?Current Outpatient Medications  ?Medication Sig Dispense Refill  ? albuterol (PROVENTIL) (2.5 MG/3ML) 0.083% nebulizer solution Take 3 mLs (2.5 mg total) by  nebulization every 6 (six) hours as needed for wheezing or shortness of breath. Take daily in am with hypertonic saline nebs. 75 mL 12  ? amiodarone (PACERONE) 200 MG tablet Take 2 tablets (400 mg total) TWICE daily for 2 weeks, then take 1 tablet (200 mg total) TWICE daily for 2 weeks, then take 1 tablet (200 mg total) ONCE daily 84 tablet 0  ? amiodarone (PACERONE) 200 MG tablet Take 1 tablet (200 mg total) by mouth daily.  90 tablet 3  ? apixaban (ELIQUIS) 5 MG TABS tablet Take 1 tablet (5 mg total) by mouth 2 (two) times daily. 60 tablet 6  ? atorvastatin (LIPITOR) 20 MG tablet Take 1 tablet by mouth. once weekly    ? Cholecalciferol (VITAMIN D3) 1000 units CAPS Take 1 capsule by mouth 2 (two) times daily.     ? co-enzyme Q-10 30 MG capsule Take by mouth.    ? finasteride (PROSCAR) 5 MG tablet Take 5 mg by mouth daily.    ? furosemide (LASIX) 40 MG tablet Take 1 tablet (40 mg total) by mouth daily. 90 tablet 3  ? insulin detemir (LEVEMIR) 100 UNIT/ML injection Inject 40 Units into the skin at bedtime.     ? metFORMIN (GLUCOPHAGE) 1000 MG tablet Take 500 mg by mouth 2 (two) times daily with a meal.    ? metoprolol succinate (TOPROL-XL) 25 MG 24 hr tablet Take 12.5 mg by mouth daily. Only takes if BP is high per patient    ? midodrine (PROAMATINE) 5 MG tablet Take 2.5 mg by mouth 3 (three) times daily with meals.    ? Multiple Vitamin (MULTI-VITAMINS) TABS Take 1 tablet by mouth daily.     ? Nutritional Supplements (PROSTA VITE PO) Take 1 tablet by mouth daily.    ? Respiratory Therapy Supplies (FLUTTER) DEVI 1 Device by Does not apply route as directed. 1 each 0  ? selenium 50 MCG TABS tablet Take 50 mcg by mouth daily.    ? sodium chloride HYPERTONIC 3 % nebulizer solution Take by nebulization in the morning and at bedtime. Use flutter valve after using this treatment. 750 mL 12  ? zinc gluconate 50 MG tablet Take 50 mg by mouth daily.    ? tamsulosin (FLOMAX) 0.4 MG CAPS capsule Take by mouth.    ? ?No current facility-administered medications for this visit.  ? ? ?Allergies:   Penicillins, Ramipril, Vancomycin, and Oxycodone  ? ?Social History:  The patient  reports that he has quit smoking. His smoking use included cigarettes. He has never used smokeless tobacco. He reports that he does not drink alcohol and does not use drugs.  ? ?Family History:  The patient's family history includes Bladder Cancer in his brother; Breast cancer  in his sister; Heart attack in his father; Heart disease in his father.  ? ? ?ROS:  Please see the history of present illness.   Otherwise, review of systems is positive for none.   All other systems are reviewed and negative.  ? ? ?PHYSICAL EXAM: ?VS:  BP 110/72   Pulse 69   Ht '5\' 9"'$  (1.753 m)   Wt 168 lb (76.2 kg)   SpO2 97%   BMI 24.81 kg/m?  , BMI Body mass index is 24.81 kg/m?. ?GEN: Well nourished, well developed, in no acute distress  ?HEENT: normal  ?Neck: no JVD, carotid bruits, or masses ?Cardiac: RRR; no murmurs, rubs, or gallops,no edema  ?Respiratory:  clear to auscultation bilaterally, normal work of  breathing ?GI: soft, nontender, nondistended, + BS ?MS: no deformity or atrophy  ?Skin: warm and dry, device pocket is well healed ?Neuro:  Strength and sensation are intact ?Psych: euthymic mood, full affect ? ?EKG:  EKG is not ordered today. ?Personal review of the ekg ordered 06/17/21 shows atrial flutter, ventricular paced ? ?Device interrogation is reviewed today in detail.  See PaceArt for details. ? ? ?Recent Labs: ?No results found for requested labs within last 8760 hours.  ? ? ?Lipid Panel  ?No results found for: CHOL, TRIG, HDL, CHOLHDL, VLDL, LDLCALC, LDLDIRECT ? ? ?Wt Readings from Last 3 Encounters:  ?07/28/21 168 lb (76.2 kg)  ?06/17/21 175 lb (79.4 kg)  ?05/09/20 179 lb 10.8 oz (81.5 kg)  ?  ? ? ?Other studies Reviewed: ?Additional studies/ records that were reviewed today include: TTE 05/05/21 ?Review of the above records today demonstrates:  ?The left ventricular size is normal.  ?There is mild concentric left ventricular hypertrophy.  ?Left ventricular systolic function is mildly reduced.  ?LV ejection fraction = 40-45%.  ?Abnormal (paradoxical) septal motion consistent with RV pacemaker.  ?There are regional wall motion abnormalities as specified below.  ?There is apical LV wall akinesis  ?The right ventricle is normal in size and function.  ?The left atrium is mildly dilated.  ?There  is moderate tricuspid regurgitation.  ?Mild pulmonary hypertension.  ?The aortic sinus is normal size.  ?Ascending Aorta measures 5.2cm.  ?IVC size was normal.  ?There is no pericardial effusion.  ?Compar

## 2021-07-28 NOTE — Patient Instructions (Addendum)
Medication Instructions:  ?Your physician has recommended you make the following change in your medication:  ?INCREASE Eliquis to '5mg'$  twice dailly ? ?2.   START Amiodarone -- you will start this on 5/08 ? - take 2 tablets (400 mg total) TWICE a day for 2 weeks, then ? - take 1 tablet (200 mg total) TWICE a day for 2 weeks, then ? - take 1 tablet (200 mg total) ONCE a day ? ? ?*If you need a refill on your cardiac medications before your next appointment, please call your pharmacy* ? ? ?Lab Work: ?See cardioversion instruction letter ? ?If you have labs (blood work) drawn today and your tests are completely normal, you will receive your results only by: ?MyChart Message (if you have MyChart) OR ?A paper copy in the mail ?If you have any lab test that is abnormal or we need to change your treatment, we will call you to review the results. ? ? ?Testing/Procedures: ?Your physician has recommended that you have a Cardioversion (DCCV). Electrical Cardioversion uses a jolt of electricity to your heart either through paddles or wired patches attached to your chest. This is a controlled, usually prescheduled, procedure. Defibrillation is done under light anesthesia in the hospital, and you usually go home the day of the procedure. This is done to get your heart back into a normal rhythm. You are not awake for the procedure. Please see the instruction sheet given to you today. ? ? ? ?Follow-Up: ?At [facility], you and your health needs are our priority.  As part of our continuing mission to provide you with exceptional heart care, we have created designated Provider Care Teams.  These Care Teams include your primary Cardiologist (physician) and Advanced Practice Providers (APPs -  Physician Assistants and Nurse Practitioners) who all work together to provide you with the care you need, when you need it. ? ?We recommend signing up for the patient portal called "MyChart".  Sign up information is provided on this After Visit  Summary.  MyChart is used to connect with patients for Virtual Visits (Telemedicine).  Patients are able to view lab/test results, encounter notes, upcoming appointments, etc.  Non-urgent messages can be sent to your provider as well.   ?To learn more about what you can do with MyChart, go to [email or web address].   ? ?Your next appointment:   ?1 year(s) ? ?The format for your next appointment:   ?In Person ? ?Provider:   ?Allegra Lai, MD in [place] ? ? ? ?Keep your scheduled follow up in June with Dr. Bettina Gavia ? ? ? ?Thank you for choosing [facility]!! ? ? ?Trinidad Curet, RN ?[phone] ? ? ?Other Instructions ? ? Electrical Cardioversion ?Electrical cardioversion is the delivery of a jolt of electricity to restore a normal rhythm to the heart. A rhythm that is too fast or is not regular keeps the heart from pumping well. In this procedure, sticky patches or metal paddles are placed on the chest to deliver electricity to the heart from a device. ?This procedure may be done in an emergency if: ?There is low or no blood pressure as a result of the heart rhythm. ?Normal rhythm must be restored as fast as possible to protect the brain and heart from further damage. ?It may save a life. ?This may also be a scheduled procedure for irregular or fast heart rhythms that are not immediately life-threatening. ?Tell a health care provider about: ?Any allergies you have. ?All medicines you are taking, including vitamins,  herbs, eye drops, creams, and over-the-counter medicines. ?Any problems you or family members have had with anesthetic medicines. ?Any blood disorders you have. ?Any surgeries you have had. ?Any medical conditions you have. ?Whether you are pregnant or may be pregnant. ?What are the risks? ?Generally, this is a safe procedure. However, problems may occur, including: ?Allergic reactions to medicines. ?A blood clot that breaks free and travels to other parts of your body. ?The possible return of an  abnormal heart rhythm within hours or days after the procedure. ?Your heart stopping (cardiac arrest). This is rare. ?What happens before the procedure? ?Medicines ?Your health care provider may have you start taking: ?Blood-thinning medicines (anticoagulants) so your blood does not clot as easily. ?Medicines to help stabilize your heart rate and rhythm. ?Ask your health care provider about: ?Changing or stopping your regular medicines. This is especially important if you are taking diabetes medicines or blood thinners. ?Taking medicines such as aspirin and ibuprofen. These medicines can thin your blood. Do not take these medicines unless your health care provider tells you to take them. ?Taking over-the-counter medicines, vitamins, herbs, and supplements. ?General instructions ?Follow instructions from your health care provider about eating or drinking restrictions. ?Plan to have someone take you home from the hospital or clinic. ?If you will be going home right after the procedure, plan to have someone with you for 24 hours. ?Ask your health care provider what steps will be taken to help prevent infection. These may include washing your skin with a germ-killing soap. ?What happens during the procedure? ? ?An IV will be inserted into one of your veins. ?Sticky patches (electrodes) or metal paddles may be placed on your chest. ?You will be given a medicine to help you relax (sedative). ?An electrical shock will be delivered. ?The procedure may vary among health care providers and hospitals. ?What can I expect after the procedure? ?Your blood pressure, heart rate, breathing rate, and blood oxygen level will be monitored until you leave the hospital or clinic. ?Your heart rhythm will be watched to make sure it does not change. ?You may have some redness on the skin where the shocks were given. ?Follow these instructions at home: ?Do not drive for 24 hours if you were given a sedative during your procedure. ?Take  over-the-counter and prescription medicines only as told by your health care provider. ?Ask your health care provider how to check your pulse. Check it often. ?Rest for 48 hours after the procedure or as told by your health care provider. ?Avoid or limit your caffeine use as told by your health care provider. ?Keep all follow-up visits as told by your health care provider. This is important. ?Contact a health care provider if: ?You feel like your heart is beating too quickly or your pulse is not regular. ?You have a serious muscle cramp that does not go away. ?Get help right away if: ?You have discomfort in your chest. ?You are dizzy or you feel faint. ?You have trouble breathing or you are short of breath. ?Your speech is slurred. ?You have trouble moving an arm or leg on one side of your body. ?Your fingers or toes turn cold or blue. ?Summary ?Electrical cardioversion is the delivery of a jolt of electricity to restore a normal rhythm to the heart. ?This procedure may be done right away in an emergency or may be a scheduled procedure if the condition is not an emergency. ?Generally, this is a safe procedure. ?After the procedure, check your pulse  often as told by your health care provider. ?This information is not intended to replace advice given to you by your health care provider. Make sure you discuss any questions you have with your health care provider. ?Document Revised: 02/26/2021 Document Reviewed: 10/30/2018 ?Elsevier Patient Education ? [place]. ? ?

## 2021-08-03 DIAGNOSIS — Z794 Long term (current) use of insulin: Secondary | ICD-10-CM | POA: Diagnosis not present

## 2021-08-03 DIAGNOSIS — J42 Unspecified chronic bronchitis: Secondary | ICD-10-CM | POA: Diagnosis not present

## 2021-08-03 DIAGNOSIS — C61 Malignant neoplasm of prostate: Secondary | ICD-10-CM | POA: Diagnosis not present

## 2021-08-03 DIAGNOSIS — Z933 Colostomy status: Secondary | ICD-10-CM | POA: Diagnosis not present

## 2021-08-03 DIAGNOSIS — I4891 Unspecified atrial fibrillation: Secondary | ICD-10-CM | POA: Diagnosis not present

## 2021-08-03 DIAGNOSIS — H353290 Exudative age-related macular degeneration, unspecified eye, stage unspecified: Secondary | ICD-10-CM | POA: Diagnosis not present

## 2021-08-03 DIAGNOSIS — G8191 Hemiplegia, unspecified affecting right dominant side: Secondary | ICD-10-CM | POA: Diagnosis not present

## 2021-08-03 DIAGNOSIS — I110 Hypertensive heart disease with heart failure: Secondary | ICD-10-CM | POA: Diagnosis not present

## 2021-08-03 DIAGNOSIS — D6869 Other thrombophilia: Secondary | ICD-10-CM | POA: Diagnosis not present

## 2021-08-03 DIAGNOSIS — E1136 Type 2 diabetes mellitus with diabetic cataract: Secondary | ICD-10-CM | POA: Diagnosis not present

## 2021-08-03 DIAGNOSIS — F0390 Unspecified dementia without behavioral disturbance: Secondary | ICD-10-CM | POA: Diagnosis not present

## 2021-08-03 DIAGNOSIS — E261 Secondary hyperaldosteronism: Secondary | ICD-10-CM | POA: Diagnosis not present

## 2021-08-04 DIAGNOSIS — E261 Secondary hyperaldosteronism: Secondary | ICD-10-CM | POA: Diagnosis not present

## 2021-08-04 DIAGNOSIS — I480 Paroxysmal atrial fibrillation: Secondary | ICD-10-CM | POA: Diagnosis not present

## 2021-08-04 DIAGNOSIS — Z6825 Body mass index (BMI) 25.0-25.9, adult: Secondary | ICD-10-CM | POA: Diagnosis not present

## 2021-08-04 DIAGNOSIS — D6869 Other thrombophilia: Secondary | ICD-10-CM | POA: Diagnosis not present

## 2021-08-04 DIAGNOSIS — E1159 Type 2 diabetes mellitus with other circulatory complications: Secondary | ICD-10-CM | POA: Diagnosis not present

## 2021-08-04 DIAGNOSIS — I509 Heart failure, unspecified: Secondary | ICD-10-CM | POA: Diagnosis not present

## 2021-08-04 DIAGNOSIS — Z950 Presence of cardiac pacemaker: Secondary | ICD-10-CM | POA: Diagnosis not present

## 2021-08-04 DIAGNOSIS — Z933 Colostomy status: Secondary | ICD-10-CM | POA: Diagnosis not present

## 2021-08-04 DIAGNOSIS — Z66 Do not resuscitate: Secondary | ICD-10-CM | POA: Diagnosis not present

## 2021-08-04 DIAGNOSIS — Z7901 Long term (current) use of anticoagulants: Secondary | ICD-10-CM | POA: Diagnosis not present

## 2021-08-04 DIAGNOSIS — Z794 Long term (current) use of insulin: Secondary | ICD-10-CM | POA: Diagnosis not present

## 2021-08-05 DIAGNOSIS — E785 Hyperlipidemia, unspecified: Secondary | ICD-10-CM | POA: Diagnosis not present

## 2021-08-05 DIAGNOSIS — I5032 Chronic diastolic (congestive) heart failure: Secondary | ICD-10-CM | POA: Diagnosis not present

## 2021-08-05 DIAGNOSIS — E1129 Type 2 diabetes mellitus with other diabetic kidney complication: Secondary | ICD-10-CM | POA: Diagnosis not present

## 2021-08-05 DIAGNOSIS — K6289 Other specified diseases of anus and rectum: Secondary | ICD-10-CM | POA: Diagnosis not present

## 2021-08-05 DIAGNOSIS — I10 Essential (primary) hypertension: Secondary | ICD-10-CM | POA: Diagnosis not present

## 2021-08-05 DIAGNOSIS — G8191 Hemiplegia, unspecified affecting right dominant side: Secondary | ICD-10-CM | POA: Diagnosis not present

## 2021-08-05 DIAGNOSIS — D638 Anemia in other chronic diseases classified elsewhere: Secondary | ICD-10-CM | POA: Diagnosis not present

## 2021-08-07 DIAGNOSIS — Z933 Colostomy status: Secondary | ICD-10-CM | POA: Diagnosis not present

## 2021-08-09 DIAGNOSIS — I5032 Chronic diastolic (congestive) heart failure: Secondary | ICD-10-CM | POA: Diagnosis not present

## 2021-08-09 DIAGNOSIS — E1129 Type 2 diabetes mellitus with other diabetic kidney complication: Secondary | ICD-10-CM | POA: Diagnosis not present

## 2021-08-09 DIAGNOSIS — I10 Essential (primary) hypertension: Secondary | ICD-10-CM | POA: Diagnosis not present

## 2021-08-13 NOTE — H&P (View-Only) (Signed)
Cardiology Office Note:    Date:  08/14/2021   ID:  Jonathan Ryan, DOB 08/20/1928, MRN [id number]  PCP:  Nicoletta Dress, MD  Cardiologist:  Shirlee More, MD    Referring MD: Nicoletta Dress, MD    ASSESSMENT:    1. Atrial flutter, unspecified type ([place])   2. Chronic anticoagulation   3. Pacemaker   4. S/P AVR (aortic valve replacement)   5. Hypertensive heart disease with heart failure ([place])   6. Orthostatic hypotension   7. Thoracic aortic aneurysm without rupture, unspecified part ([place])   8. Mild CAD    PLAN:    In order of problems listed above:  Overall Mr. Crookshanks is stable he will start his amiodarone load on Monday in preparation for cardioversion [place] with his anticoagulant continue the same Stable pacemaker function Stable AVR no evidence of valve dysfunction Heart failure is compensated continue his loop diuretic Stable continue midodrine he has had very severe symptomatic orthostatic hypotension in the past Judged not to be a candidate for elective interventions for complex thoracoabdominal aneurysm Stable CAD having no chest pain continue his medical treatment including statin beta-blocker   Next appointment: As needed for me he is seeing EP most recently with plans for elective outpatient cardioversion family is present supervising medications we will start amiodarone Monday   Medication Adjustments/Labs and Tests Ordered: Current medicines are reviewed at length with the patient today.  Concerns regarding medicines are outlined above.  No orders of the defined types were placed in this encounter.  No orders of the defined types were placed in this encounter. He is here to be seen for H&P preceding cardioversion   History of Present Illness:    Jonathan Ryan is a 88 y.o. male with a hx of surgical AVR pacemaker atrial flutter chronic anticoagulation hypertensive heart disease with heart failure complex thoracic aortic  aneurysm declined for intervention and orthostatic hypotension last seen by me 07/07/2021 and was referred to EP for his atrial arrhythmia and pacemaker surveillance.  He is initiated amiodarone therapy as an outpatient with plans for cardioversion to attempt to resume sinus rhythm scheduled 08/31/2021  Compliance with diet, lifestyle and medications: Yes  He begins his amiodarone load Monday Family supervising compliant meds including anticoagulant He is here to be seen prior to cardioversion He had edema earlier that cleared spontaneously No edema shortness of breath chest pain palpitation or syncope. Past Medical History:  Diagnosis Date   Aortic valve disorder 06/11/2015   Arthritis    Blood transfusion without reported diagnosis    CAD (coronary artery disease)    Carotid artery disease ([place]) 06/11/2015   Cataract    CHF (congestive heart failure) (HCC)    Chronic diastolic heart failure ([place]) 05/05/2017   Colostomy in place [facility]) 06/11/2015   Coronary artery disease    Coronary artery disease involving native coronary artery of native heart without angina pectoris 01/06/2015   Descending thoracic aortic aneurysm (HCC)    Diabetes mellitus without complication ([place])    Diverticular disease of left colon    Emphysema of lung ([place])    as a child   GERD (gastroesophageal reflux disease)    HTN (hypertension) 06/11/2015   Hyperlipidemia 01/06/2015   Hypertension    Hypokalemia 02/03/2019   Mild CAD 01/06/2015   History of mild CAD with cardiac catheterization in 2013 showing 20% stenosis in the LAD and proximal and 30% stenosis in the Mid-LCx.  Near syncope 06/10/2015   Postural dizziness with presyncope 08/07/2018   Pre-syncope 06/10/2015   Pulmonary embolism ([place])    Right hip pain 12/15/2016   Overview:  Added automatically from request for surgery [id number] Overview:  Added automatically from request for surgery [id number]   S/P aortic valve replacement with bioprosthetic valve 01/06/2015    SSS (sick sinus syndrome) ([place]) 09/25/2018   Statin intolerance 01/06/2015   Stroke ([place])    08/01/2015   Thoracic aortic aneurysm without rupture ([place]) 01/06/2015   Thoracic ascending aortic aneurysm [facility])    Thyroid disease    reports nodules   Type 2 diabetes mellitus with complication ([place]) 04/13/8784   Vertebral artery stenosis     Past Surgical History:  Procedure Laterality Date   AORTIC VALVE REPAIR     COLON SURGERY     HERNIA REPAIR     SMALL INTESTINE SURGERY      Current Medications: Current Meds  Medication Sig   albuterol (PROVENTIL) (2.5 MG/3ML) 0.083% nebulizer solution Take 3 mLs (2.5 mg total) by nebulization every 6 (six) hours as needed for wheezing or shortness of breath. Take daily in am with hypertonic saline nebs.   apixaban (ELIQUIS) 5 MG TABS tablet Take 1 tablet (5 mg total) by mouth 2 (two) times daily.   atorvastatin (LIPITOR) 20 MG tablet Take 1 tablet by mouth. once weekly   Cholecalciferol (VITAMIN D3) 1000 units CAPS Take 1 capsule by mouth 2 (two) times daily.    co-enzyme Q-10 30 MG capsule Take by mouth.   finasteride (PROSCAR) 5 MG tablet Take 5 mg by mouth daily.   furosemide (LASIX) 40 MG tablet Take 1 tablet (40 mg total) by mouth daily.   insulin detemir (LEVEMIR) 100 UNIT/ML injection Inject 40 Units into the skin at bedtime.    metFORMIN (GLUCOPHAGE) 1000 MG tablet Take 500 mg by mouth 2 (two) times daily with a meal.   metoprolol succinate (TOPROL-XL) 25 MG 24 hr tablet Take 12.5 mg by mouth daily. Only takes if BP is high per patient   midodrine (PROAMATINE) 5 MG tablet Take 2.5 mg by mouth 3 (three) times daily with meals.   Multiple Vitamin (MULTI-VITAMINS) TABS Take 1 tablet by mouth daily.    Nutritional Supplements (PROSTA VITE PO) Take 1 tablet by mouth daily.   Respiratory Therapy Supplies (FLUTTER) DEVI 1 Device by Does not apply route as directed.   selenium 50 MCG TABS tablet Take 50 mcg by mouth daily.   sodium chloride  HYPERTONIC 3 % nebulizer solution Take by nebulization in the morning and at bedtime. Use flutter valve after using this treatment.   tamsulosin (FLOMAX) 0.4 MG CAPS capsule Take 0.4 mg by mouth daily.   zinc gluconate 50 MG tablet Take 50 mg by mouth daily.     Allergies:   Penicillins, Ramipril, Vancomycin, and Oxycodone   Social History   Socioeconomic History   Marital status: Widowed    Spouse name: Not on file   Number of children: Not on file   Years of education: Not on file   Highest education level: Not on file  Occupational History   Not on file  Tobacco Use   Smoking status: Former    Years: 0.00    Types: Cigarettes    Passive exposure: Past   Smokeless tobacco: Never   Tobacco comments:    Smoked 2 packs/ week as a teenager   Vaping Use   Vaping Use: Never used  Substance  and Sexual Activity   Alcohol use: No    Alcohol/week: 0.0 standard drinks   Drug use: No   Sexual activity: Not on file  Other Topics Concern   Not on file  Social History Narrative   Not on file   Social Determinants of Health   Financial Resource Strain: Not on file  Food Insecurity: Not on file  Transportation Needs: Not on file  Physical Activity: Not on file  Stress: Not on file  Social Connections: Not on file     Family History: The patient's family history includes Bladder Cancer in his brother; Breast cancer in his sister; Heart attack in his father; Heart disease in his father. ROS:   Please see the history of present illness.    All other systems reviewed and are negative.  EKGs/Labs/Other Studies Reviewed:    The following studies were reviewed today:    Recent Labs: 04/10/2021: Cholesterol 170 LDL 118 triglycerides 57 HDL 49 A1c 10.5% hemoglobin 13.5 creatinine 08/06/1998 2314.1  Physical Exam:    VS:  BP 128/74 (BP Location: Right Arm, Patient Position: Sitting)   Pulse 68   Ht '5\' 9"'$  (1.753 m)   Wt 166 lb (75.3 kg)   SpO2 94%   BMI 24.51 kg/m      Wt Readings from Last 3 Encounters:  08/14/21 166 lb (75.3 kg)  07/28/21 168 lb (76.2 kg)  06/17/21 175 lb (79.4 kg)     GEN: He appears quite frail he is in a wheelchair in no acute distress HEENT: Normal NECK: No JVD; No carotid bruits LYMPHATICS: No lymphadenopathy CARDIAC: Irregular rate and rhythm no murmurs, rubs, gallops RESPIRATORY:  Clear to auscultation without rales, wheezing or rhonchi  ABDOMEN: Soft, non-tender, non-distended MUSCULOSKELETAL:  No edema; No deformity  SKIN: Warm and dry NEUROLOGIC:  Alert and oriented x 3 PSYCHIATRIC:  Normal affect    Signed, Shirlee More, MD  08/14/2021 1:47 PM     [facility]

## 2021-08-13 NOTE — Progress Notes (Signed)
?Cardiology Office Note:   ? ?Date:  08/14/2021  ? ?ID:  [name], DOB 08/08/1928, MRN [id number] ? ?PCP:  Nicoletta Dress, MD  ?Cardiologist:  Shirlee More, MD   ? ?Referring MD: Nicoletta Dress, MD  ? ? ?ASSESSMENT:   ? ?1. Atrial flutter, unspecified type ([place])   ?2. Chronic anticoagulation   ?3. Pacemaker   ?4. S/P AVR (aortic valve replacement)   ?5. Hypertensive heart disease with heart failure ([place])   ?6. Orthostatic hypotension   ?7. Thoracic aortic aneurysm without rupture, unspecified part ([place])   ?8. Mild CAD   ? ?PLAN:   ? ?In order of problems listed above: ? ?Overall Mr. Broker is stable he will start his amiodarone load on Monday in preparation for cardioversion [facility]. ?Compliant with his anticoagulant continue the same ?Stable pacemaker function ?Stable AVR no evidence of valve dysfunction ?Heart failure is compensated continue his loop diuretic ?Stable continue midodrine he has had very severe symptomatic orthostatic hypotension in the past ?Judged not to be a candidate for elective interventions for complex thoracoabdominal aneurysm ?Stable CAD having no chest pain continue his medical treatment including statin beta-blocker ? ? ?Next appointment: As needed for me he is seeing EP most recently with plans for elective outpatient cardioversion family is present supervising medications we will start amiodarone Monday ? ? ?Medication Adjustments/Labs and Tests Ordered: ?Current medicines are reviewed at length with the patient today.  Concerns regarding medicines are outlined above.  ?No orders of the defined types were placed in this encounter. ? ?No orders of the defined types were placed in this encounter. ?He is here to be seen for H&P preceding cardioversion ? ? ?History of Present Illness:   ? ?Jonathan Ryan is a 88 y.o. male with a hx of surgical AVR pacemaker atrial flutter chronic anticoagulation hypertensive heart disease with heart failure complex thoracic aortic  aneurysm declined for intervention and orthostatic hypotension last seen by me 07/07/2021 and was referred to EP for his atrial arrhythmia and pacemaker surveillance.  He is initiated amiodarone therapy as an outpatient with plans for cardioversion to attempt to resume sinus rhythm scheduled 08/31/2021 ? ?Compliance with diet, lifestyle and medications: Yes ? ?He begins his amiodarone load Monday ?Family supervising compliant meds including anticoagulant ?He is here to be seen prior to cardioversion ?He had edema earlier that cleared spontaneously ?No edema shortness of breath chest pain palpitation or syncope. ?Past Medical History:  ?Diagnosis Date  ? Aortic valve disorder 06/11/2015  ? Arthritis   ? Blood transfusion without reported diagnosis   ? CAD (coronary artery disease)   ? Carotid artery disease ([place]) 06/11/2015  ? Cataract   ? CHF (congestive heart failure) ([place])   ? Chronic diastolic heart failure ([place]) 05/05/2017  ? Colostomy in place [facility]) 06/11/2015  ? Coronary artery disease   ? Coronary artery disease involving native coronary artery of native heart without angina pectoris 01/06/2015  ? Descending thoracic aortic aneurysm ([place])   ? Diabetes mellitus without complication ([place])   ? Diverticular disease of left colon   ? Emphysema of lung ([place])   ? as a child  ? GERD (gastroesophageal reflux disease)   ? HTN (hypertension) 06/11/2015  ? Hyperlipidemia 01/06/2015  ? Hypertension   ? Hypokalemia 02/03/2019  ? Mild CAD 01/06/2015  ? History of mild CAD with cardiac catheterization in 2013 showing 20% stenosis in the LAD and proximal and 30% stenosis in the Mid-LCx.   ?  Near syncope 06/10/2015  ? Postural dizziness with presyncope 08/07/2018  ? Pre-syncope 06/10/2015  ? Pulmonary embolism ([place])   ? Right hip pain 12/15/2016  ? Overview:  Added automatically from request for surgery [id number] Overview:  Added automatically from request for surgery [phone]  ? S/P aortic valve replacement with bioprosthetic valve 01/06/2015  ?  SSS (sick sinus syndrome) ([place]) 09/25/2018  ? Statin intolerance 01/06/2015  ? Stroke [facility])   ? 08/01/2015  ? Thoracic aortic aneurysm without rupture ([place]) 01/06/2015  ? Thoracic ascending aortic aneurysm ([place])   ? Thyroid disease   ? reports nodules  ? Type 2 diabetes mellitus with complication ([place]) 12/18/9209  ? Vertebral artery stenosis   ? ? ?Past Surgical History:  ?Procedure Laterality Date  ? AORTIC VALVE REPAIR    ? COLON SURGERY    ? HERNIA REPAIR    ? SMALL INTESTINE SURGERY    ? ? ?Current Medications: ?Current Meds  ?Medication Sig  ? albuterol (PROVENTIL) (2.5 MG/3ML) 0.083% nebulizer solution Take 3 mLs (2.5 mg total) by nebulization every 6 (six) hours as needed for wheezing or shortness of breath. Take daily in am with hypertonic saline nebs.  ? apixaban (ELIQUIS) 5 MG TABS tablet Take 1 tablet (5 mg total) by mouth 2 (two) times daily.  ? atorvastatin (LIPITOR) 20 MG tablet Take 1 tablet by mouth. once weekly  ? Cholecalciferol (VITAMIN D3) 1000 units CAPS Take 1 capsule by mouth 2 (two) times daily.   ? co-enzyme Q-10 30 MG capsule Take by mouth.  ? finasteride (PROSCAR) 5 MG tablet Take 5 mg by mouth daily.  ? furosemide (LASIX) 40 MG tablet Take 1 tablet (40 mg total) by mouth daily.  ? insulin detemir (LEVEMIR) 100 UNIT/ML injection Inject 40 Units into the skin at bedtime.   ? metFORMIN (GLUCOPHAGE) 1000 MG tablet Take 500 mg by mouth 2 (two) times daily with a meal.  ? metoprolol succinate (TOPROL-XL) 25 MG 24 hr tablet Take 12.5 mg by mouth daily. Only takes if BP is high per patient  ? midodrine (PROAMATINE) 5 MG tablet Take 2.5 mg by mouth 3 (three) times daily with meals.  ? Multiple Vitamin (MULTI-VITAMINS) TABS Take 1 tablet by mouth daily.   ? Nutritional Supplements (PROSTA VITE PO) Take 1 tablet by mouth daily.  ? Respiratory Therapy Supplies (FLUTTER) DEVI 1 Device by Does not apply route as directed.  ? selenium 50 MCG TABS tablet Take 50 mcg by mouth daily.  ? sodium chloride  HYPERTONIC 3 % nebulizer solution Take by nebulization in the morning and at bedtime. Use flutter valve after using this treatment.  ? tamsulosin (FLOMAX) 0.4 MG CAPS capsule Take 0.4 mg by mouth daily.  ? zinc gluconate 50 MG tablet Take 50 mg by mouth daily.  ?  ? ?Allergies:   Penicillins, Ramipril, Vancomycin, and Oxycodone  ? ?Social History  ? ?Socioeconomic History  ? Marital status: Widowed  ?  Spouse name: Not on file  ? Number of children: Not on file  ? Years of education: Not on file  ? Highest education level: Not on file  ?Occupational History  ? Not on file  ?Tobacco Use  ? Smoking status: Former  ?  Years: 0.00  ?  Types: Cigarettes  ?  Passive exposure: Past  ? Smokeless tobacco: Never  ? Tobacco comments:  ?  Smoked 2 packs/ week as a teenager   ?Vaping Use  ? Vaping Use: Never used  ?Substance  and Sexual Activity  ? Alcohol use: No  ?  Alcohol/week: 0.0 standard drinks  ? Drug use: No  ? Sexual activity: Not on file  ?Other Topics Concern  ? Not on file  ?Social History Narrative  ? Not on file  ? ?Social Determinants of Health  ? ?Financial Resource Strain: Not on file  ?Food Insecurity: Not on file  ?Transportation Needs: Not on file  ?Physical Activity: Not on file  ?Stress: Not on file  ?Social Connections: Not on file  ?  ? ?Family History: ?The patient's family history includes Bladder Cancer in his brother; Breast cancer in his sister; Heart attack in his father; Heart disease in his father. ?ROS:   ?Please see the history of present illness.    ?All other systems reviewed and are negative. ? ?EKGs/Labs/Other Studies Reviewed:   ? ?The following studies were reviewed today: ? ? ? ?Recent Labs: ?04/10/2021: ?Cholesterol 170 LDL 118 triglycerides 57 HDL 49 A1c 10.5% hemoglobin 13.5 creatinine 08/06/1998 2314.1 ? ?Physical Exam:   ? ?VS:  BP 128/74 (BP Location: Right Arm, Patient Position: Sitting)   Pulse 68   Ht '5\' 9"'$  (1.753 m)   Wt 166 lb (75.3 kg)   SpO2 94%   BMI 24.51 kg/m?     ? ?Wt Readings from Last 3 Encounters:  ?08/14/21 166 lb (75.3 kg)  ?07/28/21 168 lb (76.2 kg)  ?06/17/21 175 lb (79.4 kg)  ?  ? ?GEN: He appears quite frail he is in a wheelchair in no acute distress ?HEENT: Normal ?NECK: N

## 2021-08-14 ENCOUNTER — Encounter: Payer: Self-pay | Admitting: Cardiology

## 2021-08-14 ENCOUNTER — Ambulatory Visit: Payer: HMO | Admitting: Cardiology

## 2021-08-14 VITALS — BP 128/74 | HR 68 | Ht 69.0 in | Wt 166.0 lb

## 2021-08-14 DIAGNOSIS — Z950 Presence of cardiac pacemaker: Secondary | ICD-10-CM

## 2021-08-14 DIAGNOSIS — I712 Thoracic aortic aneurysm, without rupture, unspecified: Secondary | ICD-10-CM

## 2021-08-14 DIAGNOSIS — I2510 Atherosclerotic heart disease of native coronary artery without angina pectoris: Secondary | ICD-10-CM

## 2021-08-14 DIAGNOSIS — I110 Hypertensive heart disease with heart failure: Secondary | ICD-10-CM | POA: Diagnosis not present

## 2021-08-14 DIAGNOSIS — Z952 Presence of prosthetic heart valve: Secondary | ICD-10-CM | POA: Diagnosis not present

## 2021-08-14 DIAGNOSIS — Z7901 Long term (current) use of anticoagulants: Secondary | ICD-10-CM

## 2021-08-14 DIAGNOSIS — I4892 Unspecified atrial flutter: Secondary | ICD-10-CM

## 2021-08-14 DIAGNOSIS — I951 Orthostatic hypotension: Secondary | ICD-10-CM | POA: Diagnosis not present

## 2021-08-14 NOTE — Patient Instructions (Signed)
Medication Instructions:  ?Your physician recommends that you continue on your current medications as directed. Please refer to the Current Medication list given to you today. ? ?*If you need a refill on your cardiac medications before your next appointment, please call your pharmacy* ? ? ?Lab Work: ?None ?If you have labs (blood work) drawn today and your tests are completely normal, you will receive your results only by: ?MyChart Message (if you have MyChart) OR ?A paper copy in the mail ?If you have any lab test that is abnormal or we need to change your treatment, we will call you to review the results. ? ? ?Testing/Procedures: ?None ? ? ?Follow-Up: ?At [facility], you and your health needs are our priority.  As part of our continuing mission to provide you with exceptional heart care, we have created designated Provider Care Teams.  These Care Teams include your primary Cardiologist (physician) and Advanced Practice Providers (APPs -  Physician Assistants and Nurse Practitioners) who all work together to provide you with the care you need, when you need it. ? ?We recommend signing up for the patient portal called "MyChart".  Sign up information is provided on this After Visit Summary.  MyChart is used to connect with patients for Virtual Visits (Telemedicine).  Patients are able to view lab/test results, encounter notes, upcoming appointments, etc.  Non-urgent messages can be sent to your provider as well.   ?To learn more about what you can do with MyChart, go to [email or web address].   ? ?Your next appointment:   ?Follow up as needed. ? ?The format for your next appointment:   ?In Person ? ?Provider:   ?Brian Munley, MD{ ? ? ?Other Instructions ?None ? ?Important Information About Sugar ? ? ? ? ? ? ?

## 2021-08-18 DIAGNOSIS — X503XXA Overexertion from repetitive movements, initial encounter: Secondary | ICD-10-CM | POA: Diagnosis not present

## 2021-08-18 DIAGNOSIS — S39012A Strain of muscle, fascia and tendon of lower back, initial encounter: Secondary | ICD-10-CM | POA: Diagnosis not present

## 2021-08-24 ENCOUNTER — Encounter (HOSPITAL_COMMUNITY): Payer: Self-pay | Admitting: Internal Medicine

## 2021-08-31 ENCOUNTER — Encounter (HOSPITAL_COMMUNITY)
Admission: RE | Disposition: A | Discharge status: Home / Self Care | Payer: HMO | Source: Home / Self Care | Attending: Internal Medicine

## 2021-08-31 ENCOUNTER — Ambulatory Visit (HOSPITAL_COMMUNITY): Payer: HMO | Admitting: Certified Registered"

## 2021-08-31 ENCOUNTER — Ambulatory Visit (HOSPITAL_COMMUNITY)
Admission: RE | Admit: 2021-08-31 | Discharge: 2021-08-31 | Disposition: A | Discharge status: Home / Self Care | Payer: HMO | Attending: Internal Medicine | Admitting: Internal Medicine

## 2021-08-31 DIAGNOSIS — I2510 Atherosclerotic heart disease of native coronary artery without angina pectoris: Secondary | ICD-10-CM | POA: Insufficient documentation

## 2021-08-31 DIAGNOSIS — I712 Thoracic aortic aneurysm, without rupture, unspecified: Secondary | ICD-10-CM | POA: Insufficient documentation

## 2021-08-31 DIAGNOSIS — Z7984 Long term (current) use of oral hypoglycemic drugs: Secondary | ICD-10-CM | POA: Diagnosis not present

## 2021-08-31 DIAGNOSIS — Z7901 Long term (current) use of anticoagulants: Secondary | ICD-10-CM | POA: Diagnosis not present

## 2021-08-31 DIAGNOSIS — J439 Emphysema, unspecified: Secondary | ICD-10-CM | POA: Diagnosis not present

## 2021-08-31 DIAGNOSIS — I5032 Chronic diastolic (congestive) heart failure: Secondary | ICD-10-CM | POA: Diagnosis not present

## 2021-08-31 DIAGNOSIS — Z953 Presence of xenogenic heart valve: Secondary | ICD-10-CM | POA: Diagnosis not present

## 2021-08-31 DIAGNOSIS — I4892 Unspecified atrial flutter: Secondary | ICD-10-CM | POA: Diagnosis not present

## 2021-08-31 DIAGNOSIS — E118 Type 2 diabetes mellitus with unspecified complications: Secondary | ICD-10-CM | POA: Diagnosis not present

## 2021-08-31 DIAGNOSIS — Z950 Presence of cardiac pacemaker: Secondary | ICD-10-CM | POA: Diagnosis not present

## 2021-08-31 DIAGNOSIS — Z79899 Other long term (current) drug therapy: Secondary | ICD-10-CM | POA: Insufficient documentation

## 2021-08-31 DIAGNOSIS — M1990 Unspecified osteoarthritis, unspecified site: Secondary | ICD-10-CM | POA: Diagnosis not present

## 2021-08-31 DIAGNOSIS — I110 Hypertensive heart disease with heart failure: Secondary | ICD-10-CM | POA: Insufficient documentation

## 2021-08-31 DIAGNOSIS — Z5309 Procedure and treatment not carried out because of other contraindication: Secondary | ICD-10-CM | POA: Diagnosis not present

## 2021-08-31 DIAGNOSIS — K219 Gastro-esophageal reflux disease without esophagitis: Secondary | ICD-10-CM | POA: Diagnosis not present

## 2021-08-31 DIAGNOSIS — Z87891 Personal history of nicotine dependence: Secondary | ICD-10-CM | POA: Insufficient documentation

## 2021-08-31 DIAGNOSIS — Z794 Long term (current) use of insulin: Secondary | ICD-10-CM | POA: Diagnosis not present

## 2021-08-31 DIAGNOSIS — I951 Orthostatic hypotension: Secondary | ICD-10-CM | POA: Diagnosis not present

## 2021-08-31 SURGERY — CANCELLED PROCEDURE

## 2021-08-31 NOTE — Progress Notes (Signed)
Patient arrived for cardioversion.  States he ate a biscuit this morning before arrival.  Cardioversion canceled.  Educated pt and daughter about importance of NPO status.  Pt to call office to reschedule.  Pt discharged home with daughter.  Vista Lawman, RN

## 2021-08-31 NOTE — Interval H&P Note (Signed)
History and Physical Interval Note:  08/31/2021 10:13 AM  Jonathan Ryan  has presented today for surgery, with the diagnosis of AFIB.  The various methods of treatment have been discussed with the patient and family. After consideration of risks, benefits and other options for treatment, the patient has consented to  Procedure(s): CARDIOVERSION (N/A) as a surgical intervention.  The patient's history has been reviewed, patient examined, no change in status, stable for surgery.  I have reviewed the patient's chart and labs.  Questions were answered to the patient's satisfaction.     Dorris Carnes

## 2021-08-31 NOTE — Anesthesia Preprocedure Evaluation (Signed)
Anesthesia Evaluation  Patient identified by MRN, date of birth, ID band Patient awake    Reviewed: Allergy & Precautions, NPO status , Patient's Chart, lab work & pertinent test results  Airway        Dental   Pulmonary COPD, former smoker,           Cardiovascular hypertension, + CAD and +CHF  + dysrhythmias (sick sinus syndrome) + Valvular Problems/Murmurs      Neuro/Psych CVA negative psych ROS   GI/Hepatic Neg liver ROS, GERD  ,  Endo/Other  negative endocrine ROSdiabetes  Renal/GU negative Renal ROS  negative genitourinary   Musculoskeletal  (+) Arthritis ,   Abdominal   Peds negative pediatric ROS (+)  Hematology negative hematology ROS (+)   Anesthesia Other Findings   Reproductive/Obstetrics negative OB ROS                             Anesthesia Physical Anesthesia Plan Anesthesia Quick Evaluation

## 2021-09-04 DIAGNOSIS — E785 Hyperlipidemia, unspecified: Secondary | ICD-10-CM | POA: Diagnosis not present

## 2021-09-04 DIAGNOSIS — E1129 Type 2 diabetes mellitus with other diabetic kidney complication: Secondary | ICD-10-CM | POA: Diagnosis not present

## 2021-09-04 DIAGNOSIS — D638 Anemia in other chronic diseases classified elsewhere: Secondary | ICD-10-CM | POA: Diagnosis not present

## 2021-09-10 DIAGNOSIS — M25511 Pain in right shoulder: Secondary | ICD-10-CM | POA: Diagnosis not present

## 2021-09-10 DIAGNOSIS — S2241XA Multiple fractures of ribs, right side, initial encounter for closed fracture: Secondary | ICD-10-CM | POA: Diagnosis not present

## 2021-09-10 DIAGNOSIS — R0781 Pleurodynia: Secondary | ICD-10-CM | POA: Diagnosis not present

## 2021-09-10 DIAGNOSIS — I712 Thoracic aortic aneurysm, without rupture, unspecified: Secondary | ICD-10-CM | POA: Diagnosis not present

## 2021-09-17 DIAGNOSIS — H353221 Exudative age-related macular degeneration, left eye, with active choroidal neovascularization: Secondary | ICD-10-CM | POA: Diagnosis not present

## 2021-09-19 NOTE — Progress Notes (Unsigned)
Cardiology Office Note:    Date:  09/21/2021   ID:  Jonathan Ryan, DOB 1929-02-09, MRN [id number]  PCP:  Nicoletta Dress, MD  Cardiologist:  Shirlee More, MD    Referring MD: Nicoletta Dress, MD    ASSESSMENT:    1. Atrial flutter, unspecified type ([place])   2. Chronic anticoagulation   3. Pacemaker   4. On amiodarone therapy   5. S/P AVR (aortic valve replacement)   6. Hypertensive heart disease with heart failure ([place])   7. Orthostatic hypotension    PLAN:    In order of problems listed above:  Jonathan Ryan remains in slow atrial flutter paced rhythm on amiodarone we will go ahead and set up cardioversion and was previously planned in which she is very committed to with the believe that is going to improve the quality of his life. Stable pacemaker function followed through our device clinic Stable hypertension heart failure and valvular function   Next appointment: 6 months   Medication Adjustments/Labs and Tests Ordered: Current medicines are reviewed at length with the patient today.  Concerns regarding medicines are outlined above.  No orders of the defined types were placed in this encounter.  No orders of the defined types were placed in this encounter.   Chief Complaint  Patient presents with   Follow-up   Atrial Fibrillation    He was scheduled for cardioversion but presented after eating breakfast.  He was sent home and continues on amiodarone    History of Present Illness:    Jonathan Ryan is a 88 y.o. male with a hx of very complex heart disease including bioprosthetic surgical AVR pacemaker of bilateral subclavian vein thrombosis atrial flutter chronic anticoagulation hypertensive heart disease with heart failure complex thoracic aortic aneurysm declined for intervention and recurrent syncope with severe symptomatic orthostatic hypotension requiring midodrine therapy.  He has had symptomatic atrial arrhythmia and he was initiated amiodarone and plan  for cardioversion when last seen 08/14/2021.  Compliance with diet, lifestyle and medications: Yes he is supervised by his family  'EKG today shows slow atrial flutter and actually a regular ventricular rate. He has had no chest pain edema shortness of breath palpitation or syncope Family is looking for assistance to try to supervise him during the day would like him to go to stay well he has declined He is emphatic that he wants cardioversion we will go ahead and schedule her [facility].   Recently seen by EP with his atrial flutter was plan for cardioversion and unfortunately presented to the hospital after eating breakfast for cardioversion and was discharged home procedure canceled.  Past Medical History:  Diagnosis Date   Aortic valve disorder 06/11/2015   Arthritis    Blood transfusion without reported diagnosis    CAD (coronary artery disease)    Carotid artery disease ([place]) 06/11/2015   Cataract    CHF (congestive heart failure) (HCC)    Chronic diastolic heart failure ([place]) 05/05/2017   Colostomy in place [facility]) 06/11/2015   Coronary artery disease    Coronary artery disease involving native coronary artery of native heart without angina pectoris 01/06/2015   Descending thoracic aortic aneurysm (HCC)    Diabetes mellitus without complication ([place])    Diverticular disease of left colon    Emphysema of lung ([place])    as a child   GERD (gastroesophageal reflux disease)    HTN (hypertension) 06/11/2015   Hyperlipidemia 01/06/2015   Hypertension    Hypokalemia  02/03/2019   Mild CAD 01/06/2015   History of mild CAD with cardiac catheterization in 2013 showing 20% stenosis in the LAD and proximal and 30% stenosis in the Mid-LCx.    Near syncope 06/10/2015   Postural dizziness with presyncope 08/07/2018   Pre-syncope 06/10/2015   Pulmonary embolism ([place])    Right hip pain 12/15/2016   Overview:  Added automatically from request for surgery [id number] Overview:  Added automatically from  request for surgery [id number]   S/P aortic valve replacement with bioprosthetic valve 01/06/2015   SSS (sick sinus syndrome) ([place]) 09/25/2018   Statin intolerance 01/06/2015   Stroke ([place])    08/01/2015   Thoracic aortic aneurysm without rupture ([place]) 01/06/2015   Thoracic ascending aortic aneurysm [facility])    Thyroid disease    reports nodules   Type 2 diabetes mellitus with complication ([place]) 05/19/348   Vertebral artery stenosis     Past Surgical History:  Procedure Laterality Date   AORTIC VALVE REPAIR     COLON SURGERY     HERNIA REPAIR     SMALL INTESTINE SURGERY      Current Medications: Current Meds  Medication Sig   amiodarone (PACERONE) 200 MG tablet Take 1 tablet (200 mg total) by mouth daily.   apixaban (ELIQUIS) 5 MG TABS tablet Take 1 tablet (5 mg total) by mouth 2 (two) times daily.   ASPERCREME LIDOCAINE 4 % Place 1 patch onto the skin daily as needed (pain).   atorvastatin (LIPITOR) 40 MG tablet Take 40 mg by mouth daily. once weekly   bicalutamide (CASODEX) 50 MG tablet Take 50 mg by mouth every other day.   Budeson-Glycopyrrol-Formoterol (BREZTRI AEROSPHERE) 160-9-4.8 MCG/ACT AERO Inhale 1 puff into the lungs daily as needed (Shortness of breath).   finasteride (PROSCAR) 5 MG tablet Take 5 mg by mouth daily.   furosemide (LASIX) 40 MG tablet Take 1 tablet (40 mg total) by mouth daily.   insulin lispro (HUMALOG) 100 UNIT/ML injection Inject 10-12 Units into the skin 3 (three) times daily before meals.   LANTUS SOLOSTAR 100 UNIT/ML Solostar Pen Inject 45 Units into the skin at bedtime.   metFORMIN (GLUCOPHAGE) 500 MG tablet Take 500 mg by mouth daily with breakfast.   OVER THE COUNTER MEDICATION Take 1 tablet by mouth daily. curamed curcumin   potassium chloride (KLOR-CON M) 10 MEQ tablet Take 10 mEq by mouth daily.   Respiratory Therapy Supplies (FLUTTER) DEVI 1 Device by Does not apply route as directed.   spironolactone (ALDACTONE) 25 MG tablet Take 25 mg by mouth  daily.   tamsulosin (FLOMAX) 0.4 MG CAPS capsule Take 0.4 mg by mouth daily.   tiZANidine (ZANAFLEX) 2 MG tablet Take 2 mg by mouth 3 (three) times daily as needed for muscle spasms.     Allergies:   Penicillins, Ramipril, Vancomycin, and Oxycodone   Social History   Socioeconomic History   Marital status: Widowed    Spouse name: Not on file   Number of children: Not on file   Years of education: Not on file   Highest education level: Not on file  Occupational History   Not on file  Tobacco Use   Smoking status: Former    Years: 0.00    Types: Cigarettes    Passive exposure: Past   Smokeless tobacco: Never   Tobacco comments:    Smoked 2 packs/ week as a teenager   Vaping Use   Vaping Use: Never used  Substance and Sexual Activity  Alcohol use: No    Alcohol/week: 0.0 standard drinks of alcohol   Drug use: No   Sexual activity: Not on file  Other Topics Concern   Not on file  Social History Narrative   Not on file   Social Determinants of Health   Financial Resource Strain: Not on file  Food Insecurity: Not on file  Transportation Needs: Not on file  Physical Activity: Not on file  Stress: Not on file  Social Connections: Not on file     Family History: The patient's family history includes Bladder Cancer in his brother; Breast cancer in his sister; Heart attack in his father; Heart disease in his father. ROS:   Please see the history of present illness.    All other systems reviewed and are negative.  EKGs/Labs/Other Studies Reviewed:    The following studies were reviewed today:  EKG:  EKG ordered today and personally reviewed.  The ekg ordered today demonstrates atrial flutter slow rate regular ventricular response 70 bpm    Physical Exam:    VS:  BP 114/70 (BP Location: Right Arm, Patient Position: Sitting)   Pulse 70   Ht '5\' 9"'$  (1.753 m)   Wt 171 lb (77.6 kg)   SpO2 99%   BMI 25.25 kg/m     Wt Readings from Last 3 Encounters:  09/21/21 171  lb (77.6 kg)  08/14/21 166 lb (75.3 kg)  07/28/21 168 lb (76.2 kg)     GEN:  Well nourished, well developed in no acute distress HEENT: Normal NECK: No JVD; No carotid bruits LYMPHATICS: No lymphadenopathy CARDIAC: RRR, no murmurs, rubs, gallops RESPIRATORY:  Clear to auscultation without rales, wheezing or rhonchi  ABDOMEN: Soft, non-tender, non-distended MUSCULOSKELETAL:  No edema; No deformity  SKIN: Warm and dry NEUROLOGIC:  Alert and oriented x 3 PSYCHIATRIC:  Normal affect    Signed, Shirlee More, MD  09/21/2021 1:23 PM    [facility]

## 2021-09-21 ENCOUNTER — Ambulatory Visit: Payer: HMO | Admitting: Cardiology

## 2021-09-21 ENCOUNTER — Encounter: Payer: Self-pay | Admitting: Cardiology

## 2021-09-21 VITALS — BP 114/70 | HR 70 | Ht 69.0 in | Wt 171.0 lb

## 2021-09-21 DIAGNOSIS — Z7901 Long term (current) use of anticoagulants: Secondary | ICD-10-CM | POA: Diagnosis not present

## 2021-09-21 DIAGNOSIS — I4892 Unspecified atrial flutter: Secondary | ICD-10-CM | POA: Diagnosis not present

## 2021-09-21 DIAGNOSIS — Z79899 Other long term (current) drug therapy: Secondary | ICD-10-CM | POA: Diagnosis not present

## 2021-09-21 DIAGNOSIS — I951 Orthostatic hypotension: Secondary | ICD-10-CM | POA: Diagnosis not present

## 2021-09-21 DIAGNOSIS — Z950 Presence of cardiac pacemaker: Secondary | ICD-10-CM

## 2021-09-21 DIAGNOSIS — I110 Hypertensive heart disease with heart failure: Secondary | ICD-10-CM | POA: Diagnosis not present

## 2021-09-21 DIAGNOSIS — Z952 Presence of prosthetic heart valve: Secondary | ICD-10-CM | POA: Diagnosis not present

## 2021-09-21 NOTE — Patient Instructions (Signed)
Medication Instructions:  Your physician recommends that you continue on your current medications as directed. Please refer to the Current Medication list given to you today.  *If you need a refill on your cardiac medications before your next appointment, please call your pharmacy*   Lab Work:  Labs today: BMP, CBC  If you have labs (blood work) drawn today and your tests are completely normal, you will receive your results only by: [place] (if you have MyChart) OR A paper copy in the mail If you have any lab test that is abnormal or we need to change your treatment, we will call you to review the results.   Testing/Procedures: Dear Jonathan Ryan  You are scheduled for a Cardioversion on 10/01/21 with Dr. Debara Pickett.  Please arrive at the [facility] (Main Entrance A) at [facility]: [address] [place], [place] [address] at 7:00 am.  DIET: Nothing to eat or drink after midnight except a sip of water with medications (see medication instructions below)  FYI: For your safety, and to allow [place] to monitor your vital signs accurately during the surgery/procedure we request that   if you have artificial nails, gel coating, SNS etc. Please have those removed prior to your surgery/procedure. Not having the nail coverings /polish removed may result in cancellation or delay of your surgery/procedure.   Medication Instructions: Hold: Metformin, Insulin Lispro, Spironolactone, Lasix   Take 1/2 dose of Lantus the night before procedure.  Continue your anticoagulant: Eliquis You will need to continue your anticoagulant after your procedure until you  are told by your  Provider that it is safe to stop   Labs: If patient is on Coumadin, patient needs pt/INR, CBC, BMET within 3 days (No pt/INR needed for patients taking Xarelto, Eliquis, Pradaxa) For patients receiving anesthesia for TEE and all Cardioversion patients: BMET, CBC within 1 week   You must have a responsible person to  drive you home and stay in the waiting area during your procedure. Failure to do so could result in cancellation.  Bring your insurance cards.  *Special Note: Every effort is made to have your procedure done on time. Occasionally there are emergencies that occur at the hospital that may cause delays. Please be patient if a delay does occur.     Follow-Up: At [facility], you and your health needs are our priority.  As part of our continuing mission to provide you with exceptional heart care, we have created designated Provider Care Teams.  These Care Teams include your primary Cardiologist (physician) and Advanced Practice Providers (APPs -  Physician Assistants and Nurse Practitioners) who all work together to provide you with the care you need, when you need it.  We recommend signing up for the patient portal called "MyChart".  Sign up information is provided on this After Visit Summary.  MyChart is used to connect with patients for Virtual Visits (Telemedicine).  Patients are able to view lab/test results, encounter notes, upcoming appointments, etc.  Non-urgent messages can be sent to your provider as well.   To learn more about what you can do with MyChart, go to [email or web address].    Your next appointment:   6 month(s)  The format for your next appointment:   In Person  Provider:   Shirlee More, MD    Other Instructions None  Important Information About [place]

## 2021-09-29 ENCOUNTER — Encounter (HOSPITAL_COMMUNITY): Payer: Self-pay | Admitting: Internal Medicine

## 2021-09-29 DIAGNOSIS — Z950 Presence of cardiac pacemaker: Secondary | ICD-10-CM | POA: Diagnosis not present

## 2021-09-29 DIAGNOSIS — Z79899 Other long term (current) drug therapy: Secondary | ICD-10-CM | POA: Diagnosis not present

## 2021-09-29 DIAGNOSIS — I110 Hypertensive heart disease with heart failure: Secondary | ICD-10-CM | POA: Diagnosis not present

## 2021-09-29 DIAGNOSIS — Z7901 Long term (current) use of anticoagulants: Secondary | ICD-10-CM | POA: Diagnosis not present

## 2021-09-29 DIAGNOSIS — I951 Orthostatic hypotension: Secondary | ICD-10-CM | POA: Diagnosis not present

## 2021-09-29 DIAGNOSIS — Z952 Presence of prosthetic heart valve: Secondary | ICD-10-CM | POA: Diagnosis not present

## 2021-09-29 DIAGNOSIS — I4892 Unspecified atrial flutter: Secondary | ICD-10-CM | POA: Diagnosis not present

## 2021-09-30 LAB — CBC
Hematocrit: 34 % — ABNORMAL LOW (ref 37.5–51.0)
Hemoglobin: 11.4 g/dL — ABNORMAL LOW (ref 13.0–17.7)
MCH: 31.4 pg (ref 26.6–33.0)
MCHC: 33.5 g/dL (ref 31.5–35.7)
MCV: 94 fL (ref 79–97)
Platelets: 174 10*3/uL (ref 150–450)
RBC: 3.63 x10E6/uL — ABNORMAL LOW (ref 4.14–5.80)
RDW: 14.8 % (ref 11.6–15.4)
WBC: 7.1 10*3/uL (ref 3.4–10.8)

## 2021-09-30 LAB — BASIC METABOLIC PANEL
BUN/Creatinine Ratio: 17 (ref 10–24)
BUN: 30 mg/dL (ref 10–36)
CO2: 28 mmol/L (ref 20–29)
Calcium: 9.3 mg/dL (ref 8.6–10.2)
Chloride: 97 mmol/L (ref 96–106)
Creatinine, Ser: 1.75 mg/dL — ABNORMAL HIGH (ref 0.76–1.27)
Glucose: 128 mg/dL — ABNORMAL HIGH (ref 70–99)
Potassium: 4.6 mmol/L (ref 3.5–5.2)
Sodium: 137 mmol/L (ref 134–144)
eGFR: 37 mL/min/{1.73_m2} — ABNORMAL LOW (ref >59)

## 2021-10-01 ENCOUNTER — Encounter (HOSPITAL_COMMUNITY)
Admission: RE | Disposition: A | Discharge status: Home / Self Care | Payer: Self-pay | Source: Home / Self Care | Attending: Internal Medicine

## 2021-10-01 ENCOUNTER — Other Ambulatory Visit: Payer: Self-pay

## 2021-10-01 ENCOUNTER — Ambulatory Visit (HOSPITAL_BASED_OUTPATIENT_CLINIC_OR_DEPARTMENT_OTHER): Payer: HMO | Admitting: Anesthesiology

## 2021-10-01 ENCOUNTER — Encounter (HOSPITAL_COMMUNITY): Payer: Self-pay | Admitting: Internal Medicine

## 2021-10-01 ENCOUNTER — Ambulatory Visit (HOSPITAL_COMMUNITY)
Admission: RE | Admit: 2021-10-01 | Discharge: 2021-10-01 | Disposition: A | Discharge status: Home / Self Care | Payer: HMO | Attending: Internal Medicine | Admitting: Internal Medicine

## 2021-10-01 ENCOUNTER — Ambulatory Visit (HOSPITAL_COMMUNITY): Payer: HMO | Admitting: Anesthesiology

## 2021-10-01 DIAGNOSIS — Z87891 Personal history of nicotine dependence: Secondary | ICD-10-CM | POA: Insufficient documentation

## 2021-10-01 DIAGNOSIS — Z7984 Long term (current) use of oral hypoglycemic drugs: Secondary | ICD-10-CM | POA: Diagnosis not present

## 2021-10-01 DIAGNOSIS — I110 Hypertensive heart disease with heart failure: Secondary | ICD-10-CM | POA: Diagnosis not present

## 2021-10-01 DIAGNOSIS — I509 Heart failure, unspecified: Secondary | ICD-10-CM | POA: Diagnosis not present

## 2021-10-01 DIAGNOSIS — I4891 Unspecified atrial fibrillation: Secondary | ICD-10-CM | POA: Diagnosis not present

## 2021-10-01 DIAGNOSIS — Z8673 Personal history of transient ischemic attack (TIA), and cerebral infarction without residual deficits: Secondary | ICD-10-CM | POA: Insufficient documentation

## 2021-10-01 DIAGNOSIS — Z7901 Long term (current) use of anticoagulants: Secondary | ICD-10-CM | POA: Insufficient documentation

## 2021-10-01 DIAGNOSIS — I2510 Atherosclerotic heart disease of native coronary artery without angina pectoris: Secondary | ICD-10-CM | POA: Insufficient documentation

## 2021-10-01 DIAGNOSIS — J449 Chronic obstructive pulmonary disease, unspecified: Secondary | ICD-10-CM | POA: Insufficient documentation

## 2021-10-01 DIAGNOSIS — Z794 Long term (current) use of insulin: Secondary | ICD-10-CM | POA: Diagnosis not present

## 2021-10-01 DIAGNOSIS — I5032 Chronic diastolic (congestive) heart failure: Secondary | ICD-10-CM | POA: Insufficient documentation

## 2021-10-01 DIAGNOSIS — I4892 Unspecified atrial flutter: Secondary | ICD-10-CM | POA: Diagnosis not present

## 2021-10-01 DIAGNOSIS — I712 Thoracic aortic aneurysm, without rupture, unspecified: Secondary | ICD-10-CM | POA: Diagnosis not present

## 2021-10-01 DIAGNOSIS — I951 Orthostatic hypotension: Secondary | ICD-10-CM | POA: Insufficient documentation

## 2021-10-01 DIAGNOSIS — E119 Type 2 diabetes mellitus without complications: Secondary | ICD-10-CM | POA: Diagnosis not present

## 2021-10-01 DIAGNOSIS — Z950 Presence of cardiac pacemaker: Secondary | ICD-10-CM | POA: Diagnosis not present

## 2021-10-01 DIAGNOSIS — Z953 Presence of xenogenic heart valve: Secondary | ICD-10-CM | POA: Insufficient documentation

## 2021-10-01 DIAGNOSIS — Z79899 Other long term (current) drug therapy: Secondary | ICD-10-CM | POA: Diagnosis not present

## 2021-10-01 HISTORY — PX: CARDIOVERSION: SHX1299

## 2021-10-01 LAB — GLUCOSE, CAPILLARY: Glucose-Capillary: 78 mg/dL (ref 70–99)

## 2021-10-01 SURGERY — CARDIOVERSION
Anesthesia: General

## 2021-10-01 MED ORDER — SODIUM CHLORIDE 0.9 % IV SOLN: INTRAVENOUS | Status: DC | PRN

## 2021-10-01 MED ORDER — LIDOCAINE 2% (20 MG/ML) 5 ML SYRINGE
INTRAMUSCULAR | Status: DC | PRN
  Administered 2021-10-01: 60 mg via INTRAVENOUS

## 2021-10-01 MED ORDER — SODIUM CHLORIDE 0.9 % IV SOLN
INTRAVENOUS | Status: AC | PRN
  Administered 2021-10-01: 500 mL via INTRAVENOUS

## 2021-10-01 MED ORDER — EPHEDRINE SULFATE-NACL 50-0.9 MG/10ML-% IV SOSY: PREFILLED_SYRINGE | INTRAVENOUS | Status: DC | PRN

## 2021-10-01 MED ORDER — DEXTROSE 50 % IV SOLN
12.5000 g | Freq: Once | INTRAVENOUS | Status: AC
  Administered 2021-10-01: 12.5 g via INTRAVENOUS
  Filled 2021-10-01: qty 50

## 2021-10-01 MED ORDER — SODIUM CHLORIDE 0.9 % IV SOLN: INTRAVENOUS | Status: DC

## 2021-10-01 MED ORDER — PROPOFOL 10 MG/ML IV BOLUS
INTRAVENOUS | Status: DC | PRN
  Administered 2021-10-01: 60 mg via INTRAVENOUS

## 2021-10-01 MED ORDER — APIXABAN 5 MG PO TABS
5.0000 mg | ORAL_TABLET | ORAL | Status: AC
  Administered 2021-10-01: 5 mg via ORAL
  Filled 2021-10-01 (×2): qty 1

## 2021-10-01 NOTE — Interval H&P Note (Signed)
History and Physical Interval Note:  10/01/2021 7:43 AM  Jonathan Ryan  has presented today for surgery, with the diagnosis of atrial fibrillation.  The various methods of treatment have been discussed with the patient and family. After consideration of risks, benefits and other options for treatment, the patient has consented to  Procedure(s): CARDIOVERSION (N/A) as a surgical intervention.  The patient's history has been reviewed, patient examined, no change in status, stable for surgery.  I have reviewed the patient's chart and labs.  Questions were answered to the patient's satisfaction.     Pixie Casino

## 2021-10-01 NOTE — Anesthesia Preprocedure Evaluation (Signed)
Anesthesia Evaluation  Patient identified by MRN, date of birth, ID band Patient awake    Reviewed: Allergy & Precautions, NPO status , Patient's Chart, lab work & pertinent test results  Airway Mallampati: II  TM Distance: >3 FB Neck ROM: Full    Dental no notable dental hx.    Pulmonary COPD, former smoker, PE   Pulmonary exam normal        Cardiovascular hypertension, + CAD and +CHF  + dysrhythmias Atrial Fibrillation + Valvular Problems/Murmurs (s/p AVR 2016) AS  Rhythm:Irregular Rate:Normal     Neuro/Psych CVA negative psych ROS   GI/Hepatic Neg liver ROS, GERD  ,  Endo/Other  diabetes, Type 2, Insulin Dependent, Oral Hypoglycemic Agents  Renal/GU negative Renal ROS  negative genitourinary   Musculoskeletal  (+) Arthritis , Osteoarthritis,    Abdominal Normal abdominal exam  (+)   Peds  Hematology negative hematology ROS (+)   Anesthesia Other Findings   Reproductive/Obstetrics                             Anesthesia Physical Anesthesia Plan  ASA: 3  Anesthesia Plan: General   Post-op Pain Management:    Induction: Intravenous  PONV Risk Score and Plan: 2 and Treatment may vary due to age or medical condition  Airway Management Planned: Mask  Additional Equipment: None  Intra-op Plan:   Post-operative Plan:   Informed Consent: I have reviewed the patients History and Physical, chart, labs and discussed the procedure including the risks, benefits and alternatives for the proposed anesthesia with the patient or authorized representative who has indicated his/her understanding and acceptance.     Dental advisory given  Plan Discussed with: CRNA  Anesthesia Plan Comments: (ECHO 2019: - Left ventricle: The cavity size was normal. Systolic function was  normal. The estimated ejection fraction was in the range of 55%  to 60%. Wall motion was normal; there were no  regional wall  motion abnormalities.  - Aortic valve: A bioprosthesis was present. There was mild to  moderate stenosis. Valve area (VTI): 0.81 cm^2. Valve area  (Vmax): 0.88 cm^2. Valve area (Vmean): 0.87 cm^2.  - Mitral valve: Calcified annulus. Valve area by pressure  half-time: 2.02 cm^2. Valve area by continuity equation (using  LVOT flow): 1.11 cm^2.  - Left atrium: The atrium was moderately dilated.  - Tricuspid valve: There was moderate regurgitation.  - Pulmonary arteries: PA peak pressure: 44 mm Hg (S). )        Anesthesia Quick Evaluation

## 2021-10-01 NOTE — Transfer of Care (Signed)
Immediate Anesthesia Transfer of Care Note  Patient: Jonathan Ryan  Procedure(s) Performed: CARDIOVERSION  Patient Location: Endoscopy Unit  Anesthesia Type:General  Level of Consciousness: drowsy  Airway & Oxygen Therapy: Patient Spontanous Breathing  Post-op Assessment: Report given to RN and Post -op Vital signs reviewed and stable  Post vital signs: Reviewed and stable  Last Vitals:  Vitals Value Taken Time  BP 115/56   Temp    Pulse 64   Resp 18   SpO2 100     Last Pain:  Vitals:   10/01/21 0721  PainSc: 0-No pain         Complications: No notable events documented.

## 2021-10-01 NOTE — CV Procedure (Signed)
   CARDIOVERSION NOTE  Procedure: Electrical Cardioversion Indications:  Atrial Flutter  Procedure Details:  Consent: Risks of procedure as well as the alternatives and risks of each were explained to the (patient/caregiver).  Consent for procedure obtained.  Time Out: Verified patient identification, verified procedure, site/side was marked, verified correct patient position, special equipment/implants available, medications/allergies/relevent history reviewed, required imaging and test results available.  Performed  Patient placed on cardiac monitor, pulse oximetry, supplemental oxygen as necessary.  Sedation given:  propofol per anesthesia Pacer pads placed anterior and posterior chest.  Cardioverted 1 time(s).  Cardioverted at 120J biphasic.  Impression: Findings: Post procedure EKG shows:  atrial sensed, v-paced (confirmed by interrogation of pacer) Complications: None Patient did tolerate procedure well.  Plan: Successful DCCV with a single 120J biphasic shock to atrial sensed, v-paced rhythm.  Time Spent Directly with the Patient:  30 minutes   Pixie Casino, MD, [facility], [place] Director of the Advanced Lipid Disorders &  Cardiovascular Risk Reduction Clinic Diplomate of the American Board of Clinical Lipidology Attending Cardiologist  Direct Dial: [phone]  Fax: [phone]  Website:  [email or web address] 10/01/2021, 8:27 AM

## 2021-10-01 NOTE — Discharge Instructions (Signed)

## 2021-10-02 ENCOUNTER — Encounter (HOSPITAL_COMMUNITY): Payer: Self-pay | Admitting: Internal Medicine

## 2021-11-04 DIAGNOSIS — Z794 Long term (current) use of insulin: Secondary | ICD-10-CM | POA: Diagnosis not present

## 2021-11-04 DIAGNOSIS — L602 Onychogryphosis: Secondary | ICD-10-CM | POA: Diagnosis not present

## 2021-11-04 DIAGNOSIS — B351 Tinea unguium: Secondary | ICD-10-CM | POA: Diagnosis not present

## 2021-11-04 DIAGNOSIS — L84 Corns and callosities: Secondary | ICD-10-CM | POA: Diagnosis not present

## 2021-11-04 DIAGNOSIS — R262 Difficulty in walking, not elsewhere classified: Secondary | ICD-10-CM | POA: Diagnosis not present

## 2021-11-04 DIAGNOSIS — E119 Type 2 diabetes mellitus without complications: Secondary | ICD-10-CM | POA: Diagnosis not present

## 2021-11-19 DIAGNOSIS — H353221 Exudative age-related macular degeneration, left eye, with active choroidal neovascularization: Secondary | ICD-10-CM | POA: Diagnosis not present

## 2021-11-25 DIAGNOSIS — Z139 Encounter for screening, unspecified: Secondary | ICD-10-CM | POA: Diagnosis not present

## 2021-11-25 DIAGNOSIS — R102 Pelvic and perineal pain: Secondary | ICD-10-CM | POA: Diagnosis not present

## 2021-11-25 DIAGNOSIS — Z1331 Encounter for screening for depression: Secondary | ICD-10-CM | POA: Diagnosis not present

## 2021-11-25 DIAGNOSIS — E785 Hyperlipidemia, unspecified: Secondary | ICD-10-CM | POA: Diagnosis not present

## 2021-11-25 DIAGNOSIS — Z0000 Encounter for general adult medical examination without abnormal findings: Secondary | ICD-10-CM | POA: Diagnosis not present

## 2021-11-25 DIAGNOSIS — C61 Malignant neoplasm of prostate: Secondary | ICD-10-CM | POA: Diagnosis not present

## 2021-12-09 DIAGNOSIS — D692 Other nonthrombocytopenic purpura: Secondary | ICD-10-CM | POA: Diagnosis not present

## 2021-12-09 DIAGNOSIS — Z66 Do not resuscitate: Secondary | ICD-10-CM | POA: Diagnosis not present

## 2021-12-09 DIAGNOSIS — I509 Heart failure, unspecified: Secondary | ICD-10-CM | POA: Diagnosis not present

## 2021-12-09 DIAGNOSIS — Z9181 History of falling: Secondary | ICD-10-CM | POA: Diagnosis not present

## 2021-12-09 DIAGNOSIS — E1159 Type 2 diabetes mellitus with other circulatory complications: Secondary | ICD-10-CM | POA: Diagnosis not present

## 2021-12-09 DIAGNOSIS — I480 Paroxysmal atrial fibrillation: Secondary | ICD-10-CM | POA: Diagnosis not present

## 2021-12-09 DIAGNOSIS — Z7901 Long term (current) use of anticoagulants: Secondary | ICD-10-CM | POA: Diagnosis not present

## 2021-12-09 DIAGNOSIS — Z950 Presence of cardiac pacemaker: Secondary | ICD-10-CM | POA: Diagnosis not present

## 2021-12-09 DIAGNOSIS — Z933 Colostomy status: Secondary | ICD-10-CM | POA: Diagnosis not present

## 2021-12-09 DIAGNOSIS — Z794 Long term (current) use of insulin: Secondary | ICD-10-CM | POA: Diagnosis not present

## 2021-12-09 DIAGNOSIS — E261 Secondary hyperaldosteronism: Secondary | ICD-10-CM | POA: Diagnosis not present

## 2021-12-09 DIAGNOSIS — D6869 Other thrombophilia: Secondary | ICD-10-CM | POA: Diagnosis not present

## 2021-12-12 DIAGNOSIS — M5136 Other intervertebral disc degeneration, lumbar region: Secondary | ICD-10-CM | POA: Diagnosis not present

## 2021-12-12 DIAGNOSIS — Z7901 Long term (current) use of anticoagulants: Secondary | ICD-10-CM | POA: Diagnosis not present

## 2021-12-12 DIAGNOSIS — F03A0 Unspecified dementia, mild, without behavioral disturbance, psychotic disturbance, mood disturbance, and anxiety: Secondary | ICD-10-CM | POA: Diagnosis not present

## 2021-12-12 DIAGNOSIS — Z952 Presence of prosthetic heart valve: Secondary | ICD-10-CM | POA: Diagnosis not present

## 2021-12-12 DIAGNOSIS — E860 Dehydration: Secondary | ICD-10-CM | POA: Diagnosis not present

## 2021-12-12 DIAGNOSIS — E7800 Pure hypercholesterolemia, unspecified: Secondary | ICD-10-CM | POA: Diagnosis not present

## 2021-12-12 DIAGNOSIS — Z885 Allergy status to narcotic agent status: Secondary | ICD-10-CM | POA: Diagnosis not present

## 2021-12-12 DIAGNOSIS — N183 Chronic kidney disease, stage 3 unspecified: Secondary | ICD-10-CM | POA: Diagnosis not present

## 2021-12-12 DIAGNOSIS — M1990 Unspecified osteoarthritis, unspecified site: Secondary | ICD-10-CM | POA: Diagnosis not present

## 2021-12-12 DIAGNOSIS — I4891 Unspecified atrial fibrillation: Secondary | ICD-10-CM | POA: Diagnosis not present

## 2021-12-12 DIAGNOSIS — Z880 Allergy status to penicillin: Secondary | ICD-10-CM | POA: Diagnosis not present

## 2021-12-12 DIAGNOSIS — Z79899 Other long term (current) drug therapy: Secondary | ICD-10-CM | POA: Diagnosis not present

## 2021-12-12 DIAGNOSIS — I129 Hypertensive chronic kidney disease with stage 1 through stage 4 chronic kidney disease, or unspecified chronic kidney disease: Secondary | ICD-10-CM | POA: Diagnosis not present

## 2021-12-12 DIAGNOSIS — E1122 Type 2 diabetes mellitus with diabetic chronic kidney disease: Secondary | ICD-10-CM | POA: Diagnosis not present

## 2021-12-12 DIAGNOSIS — I951 Orthostatic hypotension: Secondary | ICD-10-CM | POA: Diagnosis not present

## 2021-12-12 DIAGNOSIS — Z7984 Long term (current) use of oral hypoglycemic drugs: Secondary | ICD-10-CM | POA: Diagnosis not present

## 2021-12-12 DIAGNOSIS — Z043 Encounter for examination and observation following other accident: Secondary | ICD-10-CM | POA: Diagnosis not present

## 2021-12-12 DIAGNOSIS — Z881 Allergy status to other antibiotic agents status: Secondary | ICD-10-CM | POA: Diagnosis not present

## 2021-12-12 DIAGNOSIS — Z8673 Personal history of transient ischemic attack (TIA), and cerebral infarction without residual deficits: Secondary | ICD-10-CM | POA: Diagnosis not present

## 2021-12-12 DIAGNOSIS — Z950 Presence of cardiac pacemaker: Secondary | ICD-10-CM | POA: Diagnosis not present

## 2021-12-12 DIAGNOSIS — I2510 Atherosclerotic heart disease of native coronary artery without angina pectoris: Secondary | ICD-10-CM | POA: Diagnosis not present

## 2021-12-12 DIAGNOSIS — Z794 Long term (current) use of insulin: Secondary | ICD-10-CM | POA: Diagnosis not present

## 2021-12-12 DIAGNOSIS — N179 Acute kidney failure, unspecified: Secondary | ICD-10-CM | POA: Diagnosis not present

## 2021-12-12 DIAGNOSIS — Z888 Allergy status to other drugs, medicaments and biological substances status: Secondary | ICD-10-CM | POA: Diagnosis not present

## 2021-12-12 DIAGNOSIS — E11649 Type 2 diabetes mellitus with hypoglycemia without coma: Secondary | ICD-10-CM | POA: Diagnosis not present

## 2021-12-12 DIAGNOSIS — S0101XA Laceration without foreign body of scalp, initial encounter: Secondary | ICD-10-CM | POA: Diagnosis not present

## 2021-12-21 DIAGNOSIS — W19XXXA Unspecified fall, initial encounter: Secondary | ICD-10-CM | POA: Diagnosis not present

## 2021-12-21 DIAGNOSIS — R600 Localized edema: Secondary | ICD-10-CM | POA: Diagnosis not present

## 2021-12-21 DIAGNOSIS — S0101XA Laceration without foreign body of scalp, initial encounter: Secondary | ICD-10-CM | POA: Diagnosis not present

## 2021-12-21 DIAGNOSIS — I4892 Unspecified atrial flutter: Secondary | ICD-10-CM | POA: Diagnosis not present

## 2021-12-21 DIAGNOSIS — E785 Hyperlipidemia, unspecified: Secondary | ICD-10-CM | POA: Diagnosis not present

## 2021-12-21 DIAGNOSIS — I4891 Unspecified atrial fibrillation: Secondary | ICD-10-CM | POA: Diagnosis not present

## 2021-12-21 DIAGNOSIS — Y92009 Unspecified place in unspecified non-institutional (private) residence as the place of occurrence of the external cause: Secondary | ICD-10-CM | POA: Diagnosis not present

## 2021-12-21 DIAGNOSIS — I959 Hypotension, unspecified: Secondary | ICD-10-CM | POA: Diagnosis not present

## 2021-12-21 DIAGNOSIS — E039 Hypothyroidism, unspecified: Secondary | ICD-10-CM | POA: Diagnosis not present

## 2021-12-21 DIAGNOSIS — N179 Acute kidney failure, unspecified: Secondary | ICD-10-CM | POA: Diagnosis not present

## 2021-12-21 DIAGNOSIS — N1831 Chronic kidney disease, stage 3a: Secondary | ICD-10-CM | POA: Diagnosis not present

## 2021-12-28 DIAGNOSIS — M810 Age-related osteoporosis without current pathological fracture: Secondary | ICD-10-CM | POA: Diagnosis not present

## 2021-12-28 DIAGNOSIS — Z6824 Body mass index (BMI) 24.0-24.9, adult: Secondary | ICD-10-CM | POA: Diagnosis not present

## 2021-12-28 DIAGNOSIS — W19XXXA Unspecified fall, initial encounter: Secondary | ICD-10-CM | POA: Diagnosis not present

## 2021-12-28 DIAGNOSIS — M47816 Spondylosis without myelopathy or radiculopathy, lumbar region: Secondary | ICD-10-CM | POA: Diagnosis not present

## 2021-12-28 DIAGNOSIS — Z933 Colostomy status: Secondary | ICD-10-CM | POA: Diagnosis not present

## 2021-12-28 DIAGNOSIS — M25551 Pain in right hip: Secondary | ICD-10-CM | POA: Diagnosis not present

## 2022-01-14 DIAGNOSIS — H353221 Exudative age-related macular degeneration, left eye, with active choroidal neovascularization: Secondary | ICD-10-CM | POA: Diagnosis not present

## 2022-01-26 DIAGNOSIS — Z950 Presence of cardiac pacemaker: Secondary | ICD-10-CM | POA: Diagnosis not present

## 2022-01-26 DIAGNOSIS — I480 Paroxysmal atrial fibrillation: Secondary | ICD-10-CM | POA: Diagnosis not present

## 2022-01-26 DIAGNOSIS — D6869 Other thrombophilia: Secondary | ICD-10-CM | POA: Diagnosis not present

## 2022-01-26 DIAGNOSIS — Z7901 Long term (current) use of anticoagulants: Secondary | ICD-10-CM | POA: Diagnosis not present

## 2022-01-26 DIAGNOSIS — Z794 Long term (current) use of insulin: Secondary | ICD-10-CM | POA: Diagnosis not present

## 2022-01-26 DIAGNOSIS — E119 Type 2 diabetes mellitus without complications: Secondary | ICD-10-CM | POA: Diagnosis not present

## 2022-01-26 DIAGNOSIS — Z515 Encounter for palliative care: Secondary | ICD-10-CM | POA: Diagnosis not present

## 2022-04-02 ENCOUNTER — Ambulatory Visit (INDEPENDENT_AMBULATORY_CARE_PROVIDER_SITE_OTHER)

## 2022-04-02 DIAGNOSIS — I495 Sick sinus syndrome: Secondary | ICD-10-CM | POA: Diagnosis not present

## 2022-04-02 LAB — CUP PACEART REMOTE DEVICE CHECK
Battery Remaining Longevity: 77 mo
Battery Remaining Percentage: 64 %
Battery Voltage: 2.99 V
Brady Statistic AP VP Percent: 7.1 %
Brady Statistic AP VS Percent: 1 %
Brady Statistic AS VP Percent: 93 %
Brady Statistic AS VS Percent: 1 %
Brady Statistic RA Percent Paced: 5.3 %
Brady Statistic RV Percent Paced: 99 %
Date Time Interrogation Session: 20231222094107
Implantable Lead Connection Status: 753985
Implantable Lead Connection Status: 753985
Implantable Lead Implant Date: 20200703
Implantable Lead Implant Date: 20200703
Implantable Lead Location: 753859
Implantable Lead Location: 753860
Implantable Pulse Generator Implant Date: 20200703
Lead Channel Impedance Value: 400 Ohm
Lead Channel Impedance Value: 460 Ohm
Lead Channel Pacing Threshold Amplitude: 0.75 V
Lead Channel Pacing Threshold Amplitude: 0.75 V
Lead Channel Pacing Threshold Pulse Width: 0.4 ms
Lead Channel Pacing Threshold Pulse Width: 0.4 ms
Lead Channel Sensing Intrinsic Amplitude: 1.4 mV
Lead Channel Sensing Intrinsic Amplitude: 10.3 mV
Lead Channel Setting Pacing Amplitude: 1 V
Lead Channel Setting Pacing Amplitude: 2.5 V
Lead Channel Setting Pacing Pulse Width: 0.4 ms
Lead Channel Setting Sensing Sensitivity: 2.5 mV
Pulse Gen Model: 2272
Pulse Gen Serial Number: 9127308

## 2022-04-22 NOTE — Progress Notes (Signed)
Remote pacemaker transmission.   

## 2022-04-27 DIAGNOSIS — Z01818 Encounter for other preprocedural examination: Secondary | ICD-10-CM | POA: Diagnosis not present

## 2022-04-27 DIAGNOSIS — H25811 Combined forms of age-related cataract, right eye: Secondary | ICD-10-CM | POA: Diagnosis not present

## 2022-05-03 DIAGNOSIS — D638 Anemia in other chronic diseases classified elsewhere: Secondary | ICD-10-CM | POA: Diagnosis not present

## 2022-05-03 DIAGNOSIS — I5032 Chronic diastolic (congestive) heart failure: Secondary | ICD-10-CM | POA: Diagnosis not present

## 2022-05-03 DIAGNOSIS — E1129 Type 2 diabetes mellitus with other diabetic kidney complication: Secondary | ICD-10-CM | POA: Diagnosis not present

## 2022-05-03 DIAGNOSIS — E785 Hyperlipidemia, unspecified: Secondary | ICD-10-CM | POA: Diagnosis not present

## 2022-05-03 DIAGNOSIS — I4891 Unspecified atrial fibrillation: Secondary | ICD-10-CM | POA: Diagnosis not present

## 2022-05-03 DIAGNOSIS — I69351 Hemiplegia and hemiparesis following cerebral infarction affecting right dominant side: Secondary | ICD-10-CM | POA: Diagnosis not present

## 2022-05-03 DIAGNOSIS — Z01818 Encounter for other preprocedural examination: Secondary | ICD-10-CM | POA: Diagnosis not present

## 2022-05-03 DIAGNOSIS — H6123 Impacted cerumen, bilateral: Secondary | ICD-10-CM | POA: Diagnosis not present

## 2022-05-03 DIAGNOSIS — I10 Essential (primary) hypertension: Secondary | ICD-10-CM | POA: Diagnosis not present

## 2022-05-03 DIAGNOSIS — E039 Hypothyroidism, unspecified: Secondary | ICD-10-CM | POA: Diagnosis not present

## 2022-05-03 DIAGNOSIS — I4892 Unspecified atrial flutter: Secondary | ICD-10-CM | POA: Diagnosis not present

## 2022-05-06 ENCOUNTER — Telehealth: Payer: Self-pay | Admitting: Cardiology

## 2022-05-06 NOTE — Telephone Encounter (Signed)
Called the number that was provided and left a vm to callback.   Called the pt's number and spoke with the pt and changed his appointment to 05/19/22 at 1:00. Pt verbalized understanding and had no additional questions.

## 2022-05-06 NOTE — Telephone Encounter (Signed)
Patient's daughter called and said that patient needed an appt before 2/15 because patient is having cataract surgery on 2/13 and that the surgeon want to make sure patient heart is ok. Told patient their was no availability before then and that they may need medical clearance from Dr. Bettina Gavia to be cleared. Patient's daughter would like a call back for explanation due to not agreeing and wondering why we can't get him in sooner

## 2022-05-18 NOTE — Progress Notes (Unsigned)
Cardiology Office Note:    Date:  05/19/2022   ID:  GIANLUCAS EVENSON, DOB February 10, 1929, MRN [id number]  PCP:  Nicoletta Dress, MD  Cardiologist:  Shirlee More, MD    Referring MD: Nicoletta Dress, MD    ASSESSMENT:    1. SSS (sick sinus syndrome) ([place])   2. Atrial flutter, unspecified type ([place])   3. On amiodarone therapy   4. Chronic anticoagulation   5. Pacemaker   6. S/P AVR (aortic valve replacement)   7. Hypertensive heart disease with heart failure ([place])   8. Thoracic aortic aneurysm without rupture, unspecified part ([place])    PLAN:    In order of problems listed above:  Fortunately Mr. Dome is maintaining sinus rhythm apparently taking low-dose amiodarone and frequent labs done in his PCP office and I will remind him to check his thyroid and CMP every 6 months I think maintaining sinus rhythm and improving the quality of his life falls within the realm of palliative care He is not longer anticoagulated Stable pacemaker function followed in our device clinic Heart failure is compensated appears if he is taking a loop diuretic I would continue the same and avoid drugs worsening his CKD including ACE ARB or MRA.  Or trying to access a list of his medications I am told he is here today prior to cataract surgery is certainly is a minor procedure I cannot think of a reason not to do this if it improves the quality of his life however he needed a smart magnet over his generator during the procedure as he is device dependent.  I suspect it also have to suspend DNR during the surgery.   Next appointment: 6 months   Medication Adjustments/Labs and Tests Ordered: Current medicines are reviewed at length with the patient today.  Concerns regarding medicines are outlined above.  No orders of the defined types were placed in this encounter.  No orders of the defined types were placed in this encounter.   Chief Complaint  Patient presents with   Follow-up   Atrial Fibrillation     History of Present Illness:    GURNIE DURIS is a 89 y.o. male with a hx of complex heart disease including bioprosthetic surgical AVR pacemaker bilateral subclavian vein thrombosis atrial flutter chronic anticoagulation hypertensive heart disease of heart failure complex thoracic aortic aneurysm declined for intervention at [facility] and recurrent syncope with severe orthostatic hypotension requiring midodrine therapy.  He was last seen 09/21/2021 with atria flutter he underwent successful cardioversion to an atrial sensed ventricular paced rhythm 10/01/2021 [facility] and  maintained on amiodarone. Pacemaker  remote download 04/02/2022 showed no finding of atrial fibrillation He is now enrolled in hospice and has done better with more comprehensive care His caregiver self relates he has been off his anticoagulant for several months after a fall in the hospitalization. He has had edema now takes a loop diuretic daily He thinks he is still taking amiodarone we will try to get a list of his medications He has worsened kidney function his last creatinine 2.93 05/03/2022 and  Compliance with diet, lifestyle and medications: Yes Past Medical History:  Diagnosis Date   Aortic valve disorder 06/11/2015   Arthritis    Blood transfusion without reported diagnosis    CAD (coronary artery disease)    Carotid artery disease ([place]) 06/11/2015   Cataract    CHF (congestive heart failure) (HCC)    Chronic diastolic heart failure ([place]) 05/05/2017  Colostomy in place [facility]) 06/11/2015   Coronary artery disease    Coronary artery disease involving native coronary artery of native heart without angina pectoris 01/06/2015   Descending thoracic aortic aneurysm (HCC)    Diabetes mellitus without complication ([place])    Diverticular disease of left colon    Emphysema of lung ([place])    as a child   GERD (gastroesophageal reflux disease)    HTN (hypertension) 06/11/2015   Hyperlipidemia  01/06/2015   Hypertension    Hypokalemia 02/03/2019   Mild CAD 01/06/2015   History of mild CAD with cardiac catheterization in 2013 showing 20% stenosis in the LAD and proximal and 30% stenosis in the Mid-LCx.    Near syncope 06/10/2015   Postural dizziness with presyncope 08/07/2018   Pre-syncope 06/10/2015   Pulmonary embolism ([place])    Right hip pain 12/15/2016   Overview:  Added automatically from request for surgery [id number] Overview:  Added automatically from request for surgery [id number]   S/P aortic valve replacement with bioprosthetic valve 01/06/2015   SSS (sick sinus syndrome) ([place]) 09/25/2018   Statin intolerance 01/06/2015   Stroke ([place])    08/01/2015   Thoracic aortic aneurysm without rupture ([place]) 01/06/2015   Thoracic ascending aortic aneurysm (HCC)    Thyroid disease    reports nodules   Type 2 diabetes mellitus with complication ([place]) 25/36/6440   Vertebral artery stenosis     Past Surgical History:  Procedure Laterality Date   AORTIC VALVE REPAIR     CARDIOVERSION N/A 10/01/2021   Procedure: CARDIOVERSION;  Surgeon: Pixie Casino, MD;  Location: [place];  Service: Cardiovascular;  Laterality: N/A;   COLON SURGERY     HERNIA REPAIR     SMALL INTESTINE SURGERY      Current Medications: No outpatient medications have been marked as taking for the 05/19/22 encounter (Office Visit) with Richardo Priest, MD.     Allergies:   Penicillins, Ramipril, Vancomycin, and Oxycodone   Social History   Socioeconomic History   Marital status: Widowed    Spouse name: Not on file   Number of children: Not on file   Years of education: Not on file   Highest education level: Not on file  Occupational History   Not on file  Tobacco Use   Smoking status: Former    Years: 0.00    Types: Cigarettes    Passive exposure: Past   Smokeless tobacco: Never   Tobacco comments:    Smoked 2 packs/ week as a teenager   Vaping Use   Vaping Use: Never used  Substance and  Sexual Activity   Alcohol use: No    Alcohol/week: 0.0 standard drinks of alcohol   Drug use: No   Sexual activity: Not on file  Other Topics Concern   Not on file  Social History Narrative   Not on file   Social Determinants of Health   Financial Resource Strain: Not on file  Food Insecurity: Not on file  Transportation Needs: Not on file  Physical Activity: Not on file  Stress: Not on file  Social Connections: Not on file     Family History: The patient's family history includes Bladder Cancer in his brother; Breast cancer in his sister; Heart attack in his father; Heart disease in his father. ROS:   Please see the history of present illness.    All other systems reviewed and are negative.  EKGs/Labs/Other Studies Reviewed:    The following studies were reviewed today:  EKG:  EKG ordered today and personally reviewed.  The ekg ordered today demonstrates she has a manage an atrial sensed ventricular paced rhythm yes there is a way.  Taking  Recent Labs: 09/29/2021: BUN 30; Creatinine, Ser 1.75; Hemoglobin 11.4; Platelets 174; Potassium 4.6; Sodium 137  Recent Lipid Panel No results found for: "CHOL", "TRIG", "HDL", "CHOLHDL", "VLDL", "LDLCALC", "LDLDIRECT"  Physical Exam:    VS:  BP 130/80 (BP Location: Right Arm, Patient Position: Sitting, Cuff Size: Normal)   Pulse 62   Ht '5\' 9"'$  (1.753 m)   Wt 165 lb (74.8 kg)   SpO2 99%   BMI 24.37 kg/m     Wt Readings from Last 3 Encounters:  05/19/22 165 lb (74.8 kg)  10/01/21 170 lb (77.1 kg)  09/21/21 171 lb (77.6 kg)     GEN: He appears his age well nourished, well developed in no acute distress HEENT: Normal NECK: No JVD; No carotid bruits LYMPHATICS: No lymphadenopathy CARDIAC: RRR, no murmurs, rubs, gallops RESPIRATORY:  Clear to auscultation without rales, wheezing or rhonchi  ABDOMEN: Soft, non-tender, non-distended MUSCULOSKELETAL:  No edema; No deformity  SKIN: Warm and dry NEUROLOGIC:  Alert and oriented x  3 PSYCHIATRIC:  Normal affect    Signed, Shirlee More, MD  05/19/2022 1:32 PM    [facility]

## 2022-05-19 ENCOUNTER — Ambulatory Visit: Payer: HMO | Attending: Cardiology | Admitting: Cardiology

## 2022-05-19 ENCOUNTER — Encounter: Payer: Self-pay | Admitting: Cardiology

## 2022-05-19 VITALS — BP 130/80 | HR 62 | Ht 69.0 in | Wt 165.0 lb

## 2022-05-19 DIAGNOSIS — I712 Thoracic aortic aneurysm, without rupture, unspecified: Secondary | ICD-10-CM

## 2022-05-19 DIAGNOSIS — Z7901 Long term (current) use of anticoagulants: Secondary | ICD-10-CM | POA: Diagnosis not present

## 2022-05-19 DIAGNOSIS — I110 Hypertensive heart disease with heart failure: Secondary | ICD-10-CM

## 2022-05-19 DIAGNOSIS — I495 Sick sinus syndrome: Secondary | ICD-10-CM

## 2022-05-19 DIAGNOSIS — Z950 Presence of cardiac pacemaker: Secondary | ICD-10-CM | POA: Diagnosis not present

## 2022-05-19 DIAGNOSIS — I4892 Unspecified atrial flutter: Secondary | ICD-10-CM

## 2022-05-19 DIAGNOSIS — Z952 Presence of prosthetic heart valve: Secondary | ICD-10-CM | POA: Diagnosis not present

## 2022-05-19 DIAGNOSIS — Z79899 Other long term (current) drug therapy: Secondary | ICD-10-CM | POA: Diagnosis not present

## 2022-05-19 DIAGNOSIS — I951 Orthostatic hypotension: Secondary | ICD-10-CM

## 2022-05-19 NOTE — Patient Instructions (Signed)
Medication Instructions:  Your physician recommends that you continue on your current medications as directed. Please refer to the Current Medication list given to you today.  *If you need a refill on your cardiac medications before your next appointment, please call your pharmacy*   Lab Work: None If you have labs (blood work) drawn today and your tests are completely normal, you will receive your results only by: [place] (if you have MyChart) OR A paper copy in the mail If you have any lab test that is abnormal or we need to change your treatment, we will call you to review the results.   Testing/Procedures: None   Follow-Up: At [facility], you and your health needs are our priority.  As part of our continuing mission to provide you with exceptional heart care, we have created designated Provider Care Teams.  These Care Teams include your primary Cardiologist (physician) and Advanced Practice Providers (APPs -  Physician Assistants and Nurse Practitioners) who all work together to provide you with the care you need, when you need it.  We recommend signing up for the patient portal called "MyChart".  Sign up information is provided on this After Visit Summary.  MyChart is used to connect with patients for Virtual Visits (Telemedicine).  Patients are able to view lab/test results, encounter notes, upcoming appointments, etc.  Non-urgent messages can be sent to your provider as well.   To learn more about what you can do with MyChart, go to [email or web address].    Your next appointment:   6 month(s)  Provider:   Shirlee More, MD    Other Instructions None  This visit was accompanied by Truddie Hidden.

## 2022-05-27 ENCOUNTER — Ambulatory Visit: Payer: HMO | Admitting: Cardiology

## 2022-06-17 DIAGNOSIS — H353221 Exudative age-related macular degeneration, left eye, with active choroidal neovascularization: Secondary | ICD-10-CM | POA: Diagnosis not present

## 2022-07-02 ENCOUNTER — Ambulatory Visit (INDEPENDENT_AMBULATORY_CARE_PROVIDER_SITE_OTHER)

## 2022-07-02 DIAGNOSIS — I495 Sick sinus syndrome: Secondary | ICD-10-CM

## 2022-07-03 LAB — CUP PACEART REMOTE DEVICE CHECK
Battery Remaining Longevity: 73 mo
Battery Remaining Percentage: 61 %
Battery Voltage: 2.99 V
Brady Statistic AP VP Percent: 9.4 %
Brady Statistic AP VS Percent: 1 %
Brady Statistic AS VP Percent: 91 %
Brady Statistic AS VS Percent: 1 %
Brady Statistic RA Percent Paced: 7.6 %
Brady Statistic RV Percent Paced: 99 %
Date Time Interrogation Session: 20240323012239
Implantable Lead Connection Status: 753985
Implantable Lead Connection Status: 753985
Implantable Lead Implant Date: 20200703
Implantable Lead Implant Date: 20200703
Implantable Lead Location: 753859
Implantable Lead Location: 753860
Implantable Pulse Generator Implant Date: 20200703
Lead Channel Impedance Value: 400 Ohm
Lead Channel Impedance Value: 410 Ohm
Lead Channel Pacing Threshold Amplitude: 0.75 V
Lead Channel Pacing Threshold Amplitude: 0.875 V
Lead Channel Pacing Threshold Pulse Width: 0.4 ms
Lead Channel Pacing Threshold Pulse Width: 0.4 ms
Lead Channel Sensing Intrinsic Amplitude: 1.4 mV
Lead Channel Sensing Intrinsic Amplitude: 7.3 mV
Lead Channel Setting Pacing Amplitude: 1.125
Lead Channel Setting Pacing Amplitude: 2.5 V
Lead Channel Setting Pacing Pulse Width: 0.4 ms
Lead Channel Setting Sensing Sensitivity: 2.5 mV
Pulse Gen Model: 2272
Pulse Gen Serial Number: 9127308

## 2022-07-26 ENCOUNTER — Ambulatory Visit: Payer: HMO | Admitting: Cardiology

## 2022-08-03 NOTE — Progress Notes (Signed)
Remote pacemaker transmission.   

## 2022-08-25 DIAGNOSIS — M79671 Pain in right foot: Secondary | ICD-10-CM | POA: Diagnosis not present

## 2022-08-25 DIAGNOSIS — L602 Onychogryphosis: Secondary | ICD-10-CM | POA: Diagnosis not present

## 2022-08-25 DIAGNOSIS — M79672 Pain in left foot: Secondary | ICD-10-CM | POA: Diagnosis not present

## 2022-08-25 DIAGNOSIS — B351 Tinea unguium: Secondary | ICD-10-CM | POA: Diagnosis not present

## 2022-08-25 DIAGNOSIS — L84 Corns and callosities: Secondary | ICD-10-CM | POA: Diagnosis not present

## 2022-08-25 DIAGNOSIS — E119 Type 2 diabetes mellitus without complications: Secondary | ICD-10-CM | POA: Diagnosis not present

## 2022-09-02 DIAGNOSIS — D638 Anemia in other chronic diseases classified elsewhere: Secondary | ICD-10-CM | POA: Diagnosis not present

## 2022-09-02 DIAGNOSIS — E039 Hypothyroidism, unspecified: Secondary | ICD-10-CM | POA: Diagnosis not present

## 2022-09-02 DIAGNOSIS — E1129 Type 2 diabetes mellitus with other diabetic kidney complication: Secondary | ICD-10-CM | POA: Diagnosis not present

## 2022-09-02 DIAGNOSIS — I10 Essential (primary) hypertension: Secondary | ICD-10-CM | POA: Diagnosis not present

## 2022-09-05 ENCOUNTER — Other Ambulatory Visit: Payer: Self-pay | Admitting: Cardiology

## 2022-09-17 DIAGNOSIS — I639 Cerebral infarction, unspecified: Secondary | ICD-10-CM | POA: Diagnosis not present

## 2022-09-17 DIAGNOSIS — N179 Acute kidney failure, unspecified: Secondary | ICD-10-CM | POA: Diagnosis not present

## 2022-09-17 DIAGNOSIS — W19XXXA Unspecified fall, initial encounter: Secondary | ICD-10-CM | POA: Diagnosis not present

## 2022-09-17 DIAGNOSIS — R9431 Abnormal electrocardiogram [ECG] [EKG]: Secondary | ICD-10-CM | POA: Diagnosis not present

## 2022-09-17 DIAGNOSIS — M25551 Pain in right hip: Secondary | ICD-10-CM | POA: Diagnosis not present

## 2022-09-17 DIAGNOSIS — Z043 Encounter for examination and observation following other accident: Secondary | ICD-10-CM | POA: Diagnosis not present

## 2022-09-17 DIAGNOSIS — R079 Chest pain, unspecified: Secondary | ICD-10-CM | POA: Diagnosis not present

## 2022-09-17 DIAGNOSIS — I10 Essential (primary) hypertension: Secondary | ICD-10-CM | POA: Diagnosis not present

## 2022-09-17 DIAGNOSIS — J189 Pneumonia, unspecified organism: Secondary | ICD-10-CM | POA: Diagnosis not present

## 2022-09-17 DIAGNOSIS — E119 Type 2 diabetes mellitus without complications: Secondary | ICD-10-CM | POA: Diagnosis not present

## 2022-09-17 DIAGNOSIS — I509 Heart failure, unspecified: Secondary | ICD-10-CM | POA: Diagnosis not present

## 2022-09-17 DIAGNOSIS — I252 Old myocardial infarction: Secondary | ICD-10-CM | POA: Diagnosis not present

## 2022-09-17 DIAGNOSIS — I4510 Unspecified right bundle-branch block: Secondary | ICD-10-CM | POA: Diagnosis not present

## 2022-09-17 DIAGNOSIS — I444 Left anterior fascicular block: Secondary | ICD-10-CM | POA: Diagnosis not present

## 2022-09-17 DIAGNOSIS — M25559 Pain in unspecified hip: Secondary | ICD-10-CM | POA: Diagnosis not present

## 2022-09-18 DIAGNOSIS — I4510 Unspecified right bundle-branch block: Secondary | ICD-10-CM | POA: Diagnosis not present

## 2022-09-18 DIAGNOSIS — I444 Left anterior fascicular block: Secondary | ICD-10-CM | POA: Diagnosis not present

## 2022-09-18 DIAGNOSIS — R9431 Abnormal electrocardiogram [ECG] [EKG]: Secondary | ICD-10-CM | POA: Diagnosis not present

## 2022-09-18 DIAGNOSIS — M1990 Unspecified osteoarthritis, unspecified site: Secondary | ICD-10-CM | POA: Diagnosis not present

## 2022-09-18 DIAGNOSIS — N184 Chronic kidney disease, stage 4 (severe): Secondary | ICD-10-CM | POA: Diagnosis not present

## 2022-09-18 DIAGNOSIS — N281 Cyst of kidney, acquired: Secondary | ICD-10-CM | POA: Diagnosis not present

## 2022-09-18 DIAGNOSIS — Z8546 Personal history of malignant neoplasm of prostate: Secondary | ICD-10-CM | POA: Diagnosis not present

## 2022-09-18 DIAGNOSIS — D509 Iron deficiency anemia, unspecified: Secondary | ICD-10-CM | POA: Diagnosis not present

## 2022-09-18 DIAGNOSIS — N400 Enlarged prostate without lower urinary tract symptoms: Secondary | ICD-10-CM | POA: Diagnosis not present

## 2022-09-18 DIAGNOSIS — J159 Unspecified bacterial pneumonia: Secondary | ICD-10-CM | POA: Diagnosis not present

## 2022-09-18 DIAGNOSIS — F03A0 Unspecified dementia, mild, without behavioral disturbance, psychotic disturbance, mood disturbance, and anxiety: Secondary | ICD-10-CM | POA: Diagnosis not present

## 2022-09-18 DIAGNOSIS — R5381 Other malaise: Secondary | ICD-10-CM | POA: Diagnosis not present

## 2022-09-18 DIAGNOSIS — E039 Hypothyroidism, unspecified: Secondary | ICD-10-CM | POA: Diagnosis not present

## 2022-09-18 DIAGNOSIS — Z952 Presence of prosthetic heart valve: Secondary | ICD-10-CM | POA: Diagnosis not present

## 2022-09-18 DIAGNOSIS — Z043 Encounter for examination and observation following other accident: Secondary | ICD-10-CM | POA: Diagnosis not present

## 2022-09-18 DIAGNOSIS — J189 Pneumonia, unspecified organism: Secondary | ICD-10-CM | POA: Diagnosis not present

## 2022-09-18 DIAGNOSIS — I509 Heart failure, unspecified: Secondary | ICD-10-CM | POA: Diagnosis not present

## 2022-09-18 DIAGNOSIS — R079 Chest pain, unspecified: Secondary | ICD-10-CM | POA: Diagnosis not present

## 2022-09-18 DIAGNOSIS — E1122 Type 2 diabetes mellitus with diabetic chronic kidney disease: Secondary | ICD-10-CM | POA: Diagnosis not present

## 2022-09-18 DIAGNOSIS — I639 Cerebral infarction, unspecified: Secondary | ICD-10-CM | POA: Diagnosis not present

## 2022-09-18 DIAGNOSIS — Z885 Allergy status to narcotic agent status: Secondary | ICD-10-CM | POA: Diagnosis not present

## 2022-09-18 DIAGNOSIS — Z7984 Long term (current) use of oral hypoglycemic drugs: Secondary | ICD-10-CM | POA: Diagnosis not present

## 2022-09-18 DIAGNOSIS — I252 Old myocardial infarction: Secondary | ICD-10-CM | POA: Diagnosis not present

## 2022-09-18 DIAGNOSIS — M25551 Pain in right hip: Secondary | ICD-10-CM | POA: Diagnosis not present

## 2022-09-18 DIAGNOSIS — Z888 Allergy status to other drugs, medicaments and biological substances status: Secondary | ICD-10-CM | POA: Diagnosis not present

## 2022-09-18 DIAGNOSIS — Z880 Allergy status to penicillin: Secondary | ICD-10-CM | POA: Diagnosis not present

## 2022-09-18 DIAGNOSIS — I129 Hypertensive chronic kidney disease with stage 1 through stage 4 chronic kidney disease, or unspecified chronic kidney disease: Secondary | ICD-10-CM | POA: Diagnosis not present

## 2022-09-18 DIAGNOSIS — N179 Acute kidney failure, unspecified: Secondary | ICD-10-CM | POA: Diagnosis not present

## 2022-09-18 DIAGNOSIS — I4891 Unspecified atrial fibrillation: Secondary | ICD-10-CM | POA: Diagnosis not present

## 2022-09-18 DIAGNOSIS — Z8673 Personal history of transient ischemic attack (TIA), and cerebral infarction without residual deficits: Secondary | ICD-10-CM | POA: Diagnosis not present

## 2022-09-18 DIAGNOSIS — E1142 Type 2 diabetes mellitus with diabetic polyneuropathy: Secondary | ICD-10-CM | POA: Diagnosis not present

## 2022-09-18 DIAGNOSIS — Z950 Presence of cardiac pacemaker: Secondary | ICD-10-CM | POA: Diagnosis not present

## 2022-09-18 DIAGNOSIS — E119 Type 2 diabetes mellitus without complications: Secondary | ICD-10-CM | POA: Diagnosis not present

## 2022-09-18 DIAGNOSIS — I2510 Atherosclerotic heart disease of native coronary artery without angina pectoris: Secondary | ICD-10-CM | POA: Diagnosis not present

## 2022-09-18 DIAGNOSIS — D519 Vitamin B12 deficiency anemia, unspecified: Secondary | ICD-10-CM | POA: Diagnosis not present

## 2022-09-18 DIAGNOSIS — Z794 Long term (current) use of insulin: Secondary | ICD-10-CM | POA: Diagnosis not present

## 2022-09-18 DIAGNOSIS — I10 Essential (primary) hypertension: Secondary | ICD-10-CM | POA: Diagnosis not present

## 2022-09-18 DIAGNOSIS — Z881 Allergy status to other antibiotic agents status: Secondary | ICD-10-CM | POA: Diagnosis not present

## 2022-10-11 ENCOUNTER — Ambulatory Visit: Payer: HMO | Admitting: Cardiology

## 2022-10-15 ENCOUNTER — Ambulatory Visit (INDEPENDENT_AMBULATORY_CARE_PROVIDER_SITE_OTHER)

## 2022-10-15 DIAGNOSIS — I495 Sick sinus syndrome: Secondary | ICD-10-CM | POA: Diagnosis not present

## 2022-10-15 LAB — CUP PACEART REMOTE DEVICE CHECK
Battery Remaining Longevity: 69 mo
Battery Remaining Percentage: 59 %
Battery Voltage: 2.99 V
Brady Statistic AP VP Percent: 12 %
Brady Statistic AP VS Percent: 1 %
Brady Statistic AS VP Percent: 88 %
Brady Statistic AS VS Percent: 1 %
Brady Statistic RA Percent Paced: 10 %
Brady Statistic RV Percent Paced: 99 %
Date Time Interrogation Session: 20240705100635
Implantable Lead Connection Status: 753985
Implantable Lead Connection Status: 753985
Implantable Lead Implant Date: 20200703
Implantable Lead Implant Date: 20200703
Implantable Lead Location: 753859
Implantable Lead Location: 753860
Implantable Pulse Generator Implant Date: 20200703
Lead Channel Impedance Value: 400 Ohm
Lead Channel Impedance Value: 410 Ohm
Lead Channel Pacing Threshold Amplitude: 0.75 V
Lead Channel Pacing Threshold Amplitude: 0.875 V
Lead Channel Pacing Threshold Pulse Width: 0.4 ms
Lead Channel Pacing Threshold Pulse Width: 0.4 ms
Lead Channel Sensing Intrinsic Amplitude: 1 mV
Lead Channel Sensing Intrinsic Amplitude: 7 mV
Lead Channel Setting Pacing Amplitude: 1.125
Lead Channel Setting Pacing Amplitude: 2.5 V
Lead Channel Setting Pacing Pulse Width: 0.4 ms
Lead Channel Setting Sensing Sensitivity: 2.5 mV
Pulse Gen Model: 2272
Pulse Gen Serial Number: 9127308

## 2022-11-01 NOTE — Progress Notes (Signed)
Remote pacemaker transmission.   

## 2022-11-16 DIAGNOSIS — Z933 Colostomy status: Secondary | ICD-10-CM | POA: Diagnosis not present

## 2022-12-05 ENCOUNTER — Other Ambulatory Visit: Payer: Self-pay | Admitting: Cardiology

## 2022-12-07 NOTE — Telephone Encounter (Signed)
Pt's pharmacy is requesting a refill on amiodarone. Pt is under hospice care. Pt is overdue for an appt. Would Dr. Elberta Fortis like to refill this medication? Please address

## 2022-12-15 DIAGNOSIS — Z0000 Encounter for general adult medical examination without abnormal findings: Secondary | ICD-10-CM | POA: Diagnosis not present

## 2022-12-15 DIAGNOSIS — Z9181 History of falling: Secondary | ICD-10-CM | POA: Diagnosis not present

## 2023-04-05 DIAGNOSIS — Z933 Colostomy status: Secondary | ICD-10-CM | POA: Diagnosis not present

## 2023-04-15 ENCOUNTER — Ambulatory Visit (INDEPENDENT_AMBULATORY_CARE_PROVIDER_SITE_OTHER): Payer: HMO

## 2023-04-15 DIAGNOSIS — I495 Sick sinus syndrome: Secondary | ICD-10-CM

## 2023-04-15 LAB — CUP PACEART REMOTE DEVICE CHECK
Battery Remaining Longevity: 64 mo
Battery Remaining Percentage: 54 %
Battery Voltage: 2.98 V
Brady Statistic AP VP Percent: 10 %
Brady Statistic AP VS Percent: 1 %
Brady Statistic AS VP Percent: 90 %
Brady Statistic AS VS Percent: 1 %
Brady Statistic RA Percent Paced: 9 %
Brady Statistic RV Percent Paced: 99 %
Date Time Interrogation Session: 20250103020014
Implantable Lead Connection Status: 753985
Implantable Lead Connection Status: 753985
Implantable Lead Implant Date: 20200703
Implantable Lead Implant Date: 20200703
Implantable Lead Location: 753859
Implantable Lead Location: 753860
Implantable Pulse Generator Implant Date: 20200703
Lead Channel Impedance Value: 400 Ohm
Lead Channel Impedance Value: 450 Ohm
Lead Channel Pacing Threshold Amplitude: 0.75 V
Lead Channel Pacing Threshold Amplitude: 0.875 V
Lead Channel Pacing Threshold Pulse Width: 0.4 ms
Lead Channel Pacing Threshold Pulse Width: 0.4 ms
Lead Channel Sensing Intrinsic Amplitude: 1 mV
Lead Channel Sensing Intrinsic Amplitude: 11.5 mV
Lead Channel Setting Pacing Amplitude: 1.125
Lead Channel Setting Pacing Amplitude: 2.5 V
Lead Channel Setting Pacing Pulse Width: 0.4 ms
Lead Channel Setting Sensing Sensitivity: 2.5 mV
Pulse Gen Model: 2272
Pulse Gen Serial Number: 9127308

## 2023-05-20 NOTE — Progress Notes (Signed)
 Remote pacemaker transmission.

## 2023-06-11 DEATH — deceased
